# Patient Record
Sex: Male | Born: 1960 | Race: White | Hispanic: No | Marital: Married | Smoking: Never smoker
Health system: Southern US, Community
[De-identification: ages and names within clinical notes are randomized; demographics above are authoritative.]

## PROBLEM LIST (undated history)

## (undated) DIAGNOSIS — R42 Dizziness and giddiness: Secondary | ICD-10-CM

## (undated) DIAGNOSIS — R002 Palpitations: Secondary | ICD-10-CM

## (undated) DIAGNOSIS — I471 Supraventricular tachycardia, unspecified: Secondary | ICD-10-CM

## (undated) DIAGNOSIS — K219 Gastro-esophageal reflux disease without esophagitis: Secondary | ICD-10-CM

## (undated) DIAGNOSIS — I10 Essential (primary) hypertension: Secondary | ICD-10-CM

## (undated) DIAGNOSIS — I493 Ventricular premature depolarization: Secondary | ICD-10-CM

## (undated) DIAGNOSIS — H8110 Benign paroxysmal vertigo, unspecified ear: Secondary | ICD-10-CM

## (undated) HISTORY — DX: Supraventricular tachycardia, unspecified: I47.10

## (undated) HISTORY — DX: Dizziness and giddiness: R42

## (undated) HISTORY — DX: Palpitations: R00.2

## (undated) HISTORY — DX: Gastro-esophageal reflux disease without esophagitis: K21.9

## (undated) HISTORY — DX: Ventricular premature depolarization: I49.3

## (undated) HISTORY — DX: Benign paroxysmal vertigo, unspecified ear: H81.10

## (undated) HISTORY — PX: HERNIA REPAIR: SHX51

## (undated) HISTORY — PX: WISDOM TOOTH EXTRACTION: SHX21

---

## 2001-11-08 HISTORY — PX: CHOLECYSTECTOMY: SHX55

## 2001-11-08 HISTORY — PX: UMBILICAL HERNIA REPAIR: SHX196

## 2002-02-27 ENCOUNTER — Encounter (HOSPITAL_BASED_OUTPATIENT_CLINIC_OR_DEPARTMENT_OTHER): Payer: Self-pay | Admitting: General Surgery

## 2002-02-27 ENCOUNTER — Ambulatory Visit (HOSPITAL_COMMUNITY): Admission: RE | Admit: 2002-02-27 | Discharge: 2002-02-27 | Payer: Self-pay | Admitting: General Surgery

## 2002-03-28 ENCOUNTER — Encounter (HOSPITAL_BASED_OUTPATIENT_CLINIC_OR_DEPARTMENT_OTHER): Payer: Self-pay | Admitting: General Surgery

## 2002-03-30 ENCOUNTER — Ambulatory Visit (HOSPITAL_COMMUNITY): Admission: RE | Admit: 2002-03-30 | Discharge: 2002-03-31 | Payer: Self-pay | Admitting: General Surgery

## 2002-03-30 ENCOUNTER — Encounter (INDEPENDENT_AMBULATORY_CARE_PROVIDER_SITE_OTHER): Payer: Self-pay | Admitting: Specialist

## 2002-08-08 ENCOUNTER — Emergency Department (HOSPITAL_COMMUNITY): Admission: EM | Admit: 2002-08-08 | Discharge: 2002-08-08 | Payer: Self-pay | Admitting: Emergency Medicine

## 2002-08-08 ENCOUNTER — Encounter: Payer: Self-pay | Admitting: Emergency Medicine

## 2004-08-29 ENCOUNTER — Emergency Department (HOSPITAL_COMMUNITY): Admission: EM | Admit: 2004-08-29 | Discharge: 2004-08-29 | Payer: Self-pay | Admitting: *Deleted

## 2004-12-29 ENCOUNTER — Ambulatory Visit: Payer: Self-pay | Admitting: Internal Medicine

## 2005-03-31 ENCOUNTER — Ambulatory Visit: Payer: Self-pay | Admitting: Internal Medicine

## 2007-04-11 ENCOUNTER — Ambulatory Visit: Payer: Self-pay | Admitting: Internal Medicine

## 2007-04-11 LAB — CONVERTED CEMR LAB
ALT: 22 units/L (ref 0–40)
AST: 23 units/L (ref 0–37)
Albumin: 4 g/dL (ref 3.5–5.2)
Alkaline Phosphatase: 47 units/L (ref 39–117)
BUN: 12 mg/dL (ref 6–23)
Basophils Absolute: 0 10*3/uL (ref 0.0–0.1)
Basophils Relative: 0.6 % (ref 0.0–1.0)
Bilirubin, Direct: 0.1 mg/dL (ref 0.0–0.3)
CO2: 31 meq/L (ref 19–32)
Calcium: 9.3 mg/dL (ref 8.4–10.5)
Chloride: 107 meq/L (ref 96–112)
Cholesterol: 196 mg/dL (ref 0–200)
Creatinine, Ser: 0.9 mg/dL (ref 0.4–1.5)
Eosinophils Absolute: 0.1 10*3/uL (ref 0.0–0.6)
Eosinophils Relative: 1.8 % (ref 0.0–5.0)
GFR calc Af Amer: 116 mL/min
GFR calc non Af Amer: 96 mL/min
Glucose, Bld: 99 mg/dL (ref 70–99)
HCT: 46.7 % (ref 39.0–52.0)
HDL: 39.2 mg/dL (ref >39.0)
Hemoglobin: 16.2 g/dL (ref 13.0–17.0)
LDL Cholesterol: 124 mg/dL — ABNORMAL HIGH (ref 0–99)
Lymphocytes Relative: 27.3 % (ref 12.0–46.0)
MCHC: 34.6 g/dL (ref 30.0–36.0)
MCV: 89.8 fL (ref 78.0–100.0)
Monocytes Absolute: 0.5 10*3/uL (ref 0.2–0.7)
Monocytes Relative: 7.4 % (ref 3.0–11.0)
Neutro Abs: 4.4 10*3/uL (ref 1.4–7.7)
Neutrophils Relative %: 62.9 % (ref 43.0–77.0)
Platelets: 317 10*3/uL (ref 150–400)
Potassium: 4.3 meq/L (ref 3.5–5.1)
RBC: 5.21 M/uL (ref 4.22–5.81)
RDW: 12.4 % (ref 11.5–14.6)
Sodium: 141 meq/L (ref 135–145)
TSH: 1.25 microintl units/mL (ref 0.35–5.50)
Total Bilirubin: 1.2 mg/dL (ref 0.3–1.2)
Total CHOL/HDL Ratio: 5
Total Protein: 7.6 g/dL (ref 6.0–8.3)
Triglycerides: 165 mg/dL — ABNORMAL HIGH (ref 0–149)
VLDL: 33 mg/dL (ref 0–40)
WBC: 6.9 10*3/uL (ref 4.5–10.5)

## 2007-05-26 ENCOUNTER — Encounter: Payer: Self-pay | Admitting: Internal Medicine

## 2007-05-26 ENCOUNTER — Ambulatory Visit: Payer: Self-pay | Admitting: Internal Medicine

## 2008-04-27 ENCOUNTER — Emergency Department (HOSPITAL_COMMUNITY): Admission: EM | Admit: 2008-04-27 | Discharge: 2008-04-27 | Payer: Self-pay | Admitting: Emergency Medicine

## 2008-04-29 ENCOUNTER — Ambulatory Visit: Payer: Self-pay | Admitting: Internal Medicine

## 2008-04-29 DIAGNOSIS — R42 Dizziness and giddiness: Secondary | ICD-10-CM

## 2008-04-29 HISTORY — DX: Dizziness and giddiness: R42

## 2008-05-02 ENCOUNTER — Telehealth: Payer: Self-pay | Admitting: Internal Medicine

## 2008-07-01 ENCOUNTER — Ambulatory Visit: Payer: Self-pay | Admitting: Internal Medicine

## 2008-07-01 ENCOUNTER — Telehealth: Payer: Self-pay | Admitting: Internal Medicine

## 2009-07-25 ENCOUNTER — Ambulatory Visit: Payer: Self-pay | Admitting: Internal Medicine

## 2009-07-25 LAB — CONVERTED CEMR LAB
ALT: 22 units/L (ref 0–53)
AST: 19 units/L (ref 0–37)
Albumin: 4.2 g/dL (ref 3.5–5.2)
Alkaline Phosphatase: 45 units/L (ref 39–117)
BUN: 12 mg/dL (ref 6–23)
Basophils Absolute: 0.1 10*3/uL (ref 0.0–0.1)
Basophils Relative: 0.9 % (ref 0.0–3.0)
Bilirubin Urine: NEGATIVE
Bilirubin, Direct: 0.2 mg/dL (ref 0.0–0.3)
CO2: 32 meq/L (ref 19–32)
Calcium: 9.6 mg/dL (ref 8.4–10.5)
Chloride: 106 meq/L (ref 96–112)
Cholesterol: 194 mg/dL (ref 0–200)
Creatinine, Ser: 1.1 mg/dL (ref 0.4–1.5)
Eosinophils Absolute: 0.1 10*3/uL (ref 0.0–0.7)
Eosinophils Relative: 1.5 % (ref 0.0–5.0)
GFR calc non Af Amer: 75.31 mL/min (ref >60)
Glucose, Bld: 104 mg/dL — ABNORMAL HIGH (ref 70–99)
HCT: 48 % (ref 39.0–52.0)
HDL: 41.4 mg/dL (ref >39.00)
Hemoglobin, Urine: NEGATIVE
Hemoglobin: 16.6 g/dL (ref 13.0–17.0)
Ketones, ur: NEGATIVE mg/dL
LDL Cholesterol: 122 mg/dL — ABNORMAL HIGH (ref 0–99)
Leukocytes, UA: NEGATIVE
Lymphocytes Relative: 21.7 % (ref 12.0–46.0)
Lymphs Abs: 1.9 10*3/uL (ref 0.7–4.0)
MCHC: 34.5 g/dL (ref 30.0–36.0)
MCV: 91.6 fL (ref 78.0–100.0)
Monocytes Absolute: 0.5 10*3/uL (ref 0.1–1.0)
Monocytes Relative: 5.9 % (ref 3.0–12.0)
Neutro Abs: 6.2 10*3/uL (ref 1.4–7.7)
Neutrophils Relative %: 70 % (ref 43.0–77.0)
Nitrite: NEGATIVE
PSA: 0.29 ng/mL (ref 0.10–4.00)
Platelets: 269 10*3/uL (ref 150.0–400.0)
Potassium: 4.7 meq/L (ref 3.5–5.1)
RBC: 5.24 M/uL (ref 4.22–5.81)
RDW: 12.8 % (ref 11.5–14.6)
Sodium: 141 meq/L (ref 135–145)
Specific Gravity, Urine: 1.025 (ref 1.000–1.030)
TSH: 1.41 microintl units/mL (ref 0.35–5.50)
Total Bilirubin: 1.3 mg/dL — ABNORMAL HIGH (ref 0.3–1.2)
Total CHOL/HDL Ratio: 5
Total Protein, Urine: NEGATIVE mg/dL
Total Protein: 8.2 g/dL (ref 6.0–8.3)
Triglycerides: 154 mg/dL — ABNORMAL HIGH (ref 0.0–149.0)
Urine Glucose: NEGATIVE mg/dL
Urobilinogen, UA: 0.2 (ref 0.0–1.0)
VLDL: 30.8 mg/dL (ref 0.0–40.0)
WBC: 8.8 10*3/uL (ref 4.5–10.5)
pH: 5.5 (ref 5.0–8.0)

## 2009-08-01 ENCOUNTER — Ambulatory Visit: Payer: Self-pay | Admitting: Internal Medicine

## 2009-08-15 ENCOUNTER — Telehealth (INDEPENDENT_AMBULATORY_CARE_PROVIDER_SITE_OTHER): Payer: Self-pay | Admitting: *Deleted

## 2009-08-21 ENCOUNTER — Telehealth (INDEPENDENT_AMBULATORY_CARE_PROVIDER_SITE_OTHER): Payer: Self-pay | Admitting: *Deleted

## 2010-11-26 ENCOUNTER — Other Ambulatory Visit: Payer: Self-pay | Admitting: Internal Medicine

## 2010-11-26 ENCOUNTER — Ambulatory Visit
Admission: RE | Admit: 2010-11-26 | Discharge: 2010-11-26 | Payer: Self-pay | Source: Home / Self Care | Attending: Internal Medicine | Admitting: Internal Medicine

## 2010-11-26 LAB — CBC WITH DIFFERENTIAL/PLATELET
Basophils Absolute: 0 10*3/uL (ref 0.0–0.1)
Basophils Relative: 0.3 % (ref 0.0–3.0)
Eosinophils Absolute: 0.1 10*3/uL (ref 0.0–0.7)
Eosinophils Relative: 1.5 % (ref 0.0–5.0)
HCT: 47.1 % (ref 39.0–52.0)
Hemoglobin: 16.5 g/dL (ref 13.0–17.0)
Lymphocytes Relative: 31.6 % (ref 12.0–46.0)
Lymphs Abs: 2.4 10*3/uL (ref 0.7–4.0)
MCHC: 35.1 g/dL (ref 30.0–36.0)
MCV: 90.7 fl (ref 78.0–100.0)
Monocytes Absolute: 0.5 10*3/uL (ref 0.1–1.0)
Monocytes Relative: 6.4 % (ref 3.0–12.0)
Neutro Abs: 4.6 10*3/uL (ref 1.4–7.7)
Neutrophils Relative %: 60.2 % (ref 43.0–77.0)
Platelets: 299 10*3/uL (ref 150.0–400.0)
RBC: 5.19 Mil/uL (ref 4.22–5.81)
RDW: 13.2 % (ref 11.5–14.6)
WBC: 7.7 10*3/uL (ref 4.5–10.5)

## 2010-11-26 LAB — BASIC METABOLIC PANEL
BUN: 15 mg/dL (ref 6–23)
CO2: 30 mEq/L (ref 19–32)
Calcium: 9.4 mg/dL (ref 8.4–10.5)
Chloride: 101 mEq/L (ref 96–112)
Creatinine, Ser: 1.1 mg/dL (ref 0.4–1.5)
GFR: 75.7 mL/min (ref >60.00)
Glucose, Bld: 104 mg/dL — ABNORMAL HIGH (ref 70–99)
Potassium: 5.2 mEq/L — ABNORMAL HIGH (ref 3.5–5.1)
Sodium: 137 mEq/L (ref 135–145)

## 2010-11-26 LAB — CONVERTED CEMR LAB
Bilirubin Urine: NEGATIVE
Blood in Urine, dipstick: NEGATIVE
Glucose, Urine, Semiquant: NEGATIVE
Ketones, urine, test strip: NEGATIVE
Nitrite: NEGATIVE
Protein, U semiquant: NEGATIVE
Specific Gravity, Urine: 1.02
Urobilinogen, UA: 0.2
WBC Urine, dipstick: NEGATIVE
pH: 5.5

## 2010-11-26 LAB — LIPID PANEL
Cholesterol: 179 mg/dL (ref 0–200)
HDL: 45.1 mg/dL (ref >39.00)
LDL Cholesterol: 108 mg/dL — ABNORMAL HIGH (ref 0–99)
Total CHOL/HDL Ratio: 4
Triglycerides: 128 mg/dL (ref 0.0–149.0)
VLDL: 25.6 mg/dL (ref 0.0–40.0)

## 2010-11-26 LAB — HEPATIC FUNCTION PANEL
ALT: 25 U/L (ref 0–53)
AST: 26 U/L (ref 0–37)
Albumin: 4.5 g/dL (ref 3.5–5.2)
Alkaline Phosphatase: 48 U/L (ref 39–117)
Bilirubin, Direct: 0.1 mg/dL (ref 0.0–0.3)
Total Bilirubin: 0.8 mg/dL (ref 0.3–1.2)
Total Protein: 7.7 g/dL (ref 6.0–8.3)

## 2010-11-26 LAB — TSH: TSH: 1.32 u[IU]/mL (ref 0.35–5.50)

## 2010-11-26 LAB — PSA: PSA: 0.33 ng/mL (ref 0.10–4.00)

## 2010-12-03 ENCOUNTER — Encounter: Payer: Self-pay | Admitting: Internal Medicine

## 2010-12-04 ENCOUNTER — Ambulatory Visit
Admission: RE | Admit: 2010-12-04 | Discharge: 2010-12-04 | Payer: Self-pay | Source: Home / Self Care | Attending: Internal Medicine | Admitting: Internal Medicine

## 2010-12-04 ENCOUNTER — Encounter: Payer: Self-pay | Admitting: Internal Medicine

## 2010-12-04 NOTE — Progress Notes (Signed)
  Subjective:    Patient ID: Dustin [name], male    DOB: October 24, 1961, 50 y.o.   MRN: [id number]  HPI  50 year old patient who is seen today for a preventive health examination.  He has enjoyed excellent health.  He has no concerns or complaints today.  He exercises regularly without restrictions mainly weight training, but is planning more aerobic activities.    Review of Systems  Constitutional: Negative for fever, chills, activity change, appetite change and fatigue.  HENT: Negative for hearing loss, ear pain, congestion, rhinorrhea, sneezing, mouth sores, trouble swallowing, neck pain, neck stiffness, dental problem, voice change, sinus pressure and tinnitus.   Eyes: Negative for photophobia, pain, redness and visual disturbance.  Respiratory: Negative for apnea, cough, choking, chest tightness, shortness of breath and wheezing.   Cardiovascular: Negative for chest pain, palpitations and leg swelling.  Gastrointestinal: Negative for nausea, vomiting, abdominal pain, diarrhea, constipation, blood in stool, abdominal distention, anal bleeding and rectal pain.  Genitourinary: Negative for dysuria, urgency, frequency, hematuria, flank pain, decreased urine volume, discharge, penile swelling, scrotal swelling, difficulty urinating, genital sores and testicular pain.  Musculoskeletal: Negative for myalgias, back pain, joint swelling, arthralgias and gait problem.  Skin: Negative for color change, rash and wound.  Neurological: Negative for dizziness, tremors, seizures, syncope, facial asymmetry, speech difficulty, weakness, light-headedness, numbness and headaches.  Hematological: Negative for adenopathy. Does not bruise/bleed easily.  Psychiatric/Behavioral: Negative for suicidal ideas, hallucinations, behavioral problems, confusion, sleep disturbance, self-injury, dysphoric mood, decreased concentration and agitation. The patient is not nervous/anxious.        Objective:   Physical Exam    Constitutional: He is oriented to person, place, and time. He appears well-developed and well-nourished.  HENT:  Head: Normocephalic and atraumatic.  Right Ear: External ear normal.  Left Ear: External ear normal.  Nose: Nose normal.  Mouth/Throat: Oropharynx is clear and moist.  Eyes: Conjunctivae and EOM are normal. Pupils are equal, round, and reactive to light. No scleral icterus.  Neck: Normal range of motion. Neck supple. No JVD present. No thyromegaly present.  Cardiovascular: Normal rate, regular rhythm, normal heart sounds and intact distal pulses.  Exam reveals no gallop and no friction rub.   No murmur heard. Pulmonary/Chest: Effort normal and breath sounds normal. He exhibits no tenderness.  Abdominal: Soft. Bowel sounds are normal. He exhibits no distension and no mass. There is no tenderness. There is no rebound and no guarding.  Genitourinary: Prostate normal and penis normal. Guaiac negative stool.  Musculoskeletal: Normal range of motion. He exhibits no edema and no tenderness.  Lymphadenopathy:    He has no cervical adenopathy.  Neurological: He is alert and oriented to person, place, and time. He has normal reflexes. No cranial nerve deficit. Coordination normal.  Skin: Skin is warm and dry. No rash noted.  Psychiatric: He has a normal mood and affect. His behavior is normal. Judgment and thought content normal.          Assessment & Plan:  Impression- unremarkable, clinical examination. Plan-   Will ask the patient to restrict his salt.  He was encouraged to increase his aerobic activity is as well has his weight training.  He will continue to maintain a heart healthy diet.  Prophylactic aspirin therapy.  Discussed.  He was told that in spite of his family history of coronary artery disease.  His overall risk was low and aspirin therapy was optional.  This he will consider.  A screening colonoscopy will be set up

## 2010-12-10 NOTE — Assessment & Plan Note (Signed)
Summary: cpx/njr   Vital Signs:  Dustin [name] profile:   50 year old male Height:      72 inches Weight:      208 pounds BMI:     28.31 O2 Sat:      97 % on Room air Temp:     97.9 degrees F oral Pulse rate:   94 / minute BP sitting:   150 / 90  (right arm) Cuff size:   regular  Vitals Entered By: Duard Brady LPN (December 04, 2010 1:58 PM)  O2 Flow:  Room air CC: cpx - doing well Is Dustin [name] Diabetic? No   CC:  cpx - doing well.  History of Present Illness: 50 year old Dustin [name] who is seen today for a wellness exam  Preventive Screening-Counseling & Management  Alcohol-Tobacco     Smoking Status: quit  Allergies: 1)  ! Novocain (Procaine Hcl)  Past History:  Past Medical History: Reviewed history from 08/01/2009 and no changes required. Orthostatic hypotension  Past Surgical History: Reviewed history from 04/29/2008 and no changes required. umbilical hernia repair 2003 Cholecystectomy 2003  Family History: Reviewed history from 07/Dustin/2008 and no changes required. Family History of CAD Male 1st degree relative <60 father died at 33, status post bypass surgery.  Mother has hypertension. DM2? age 52 Three sisters, one with a history of melanoma; one sister CAD s/p stent and MI  (age 73) Family History of CAD Male 1st degree relative <50  Social History: Reviewed history from 08/01/2009 and no changes required. Regular exercise-yes Former Landscape architect Smoking Status:  quit  Review of Systems  The Dustin [name] denies anorexia, fever, weight loss, weight gain, vision loss, decreased hearing, hoarseness, chest pain, syncope, dyspnea on exertion, peripheral edema, prolonged cough, headaches, hemoptysis, abdominal pain, melena, hematochezia, severe indigestion/heartburn, hematuria, incontinence, genital sores, muscle weakness, suspicious skin lesions, transient blindness, difficulty walking, depression, unusual weight change, abnormal bleeding, enlarged  lymph nodes, angioedema, breast masses, and testicular masses.    Physical Exam  General:  Well-developed,well-nourished,in no acute distress; alert,appropriate and cooperative throughout examination Head:  Normocephalic and atraumatic without obvious abnormalities. No apparent alopecia or balding. Eyes:  No corneal or conjunctival inflammation noted. EOMI. Perrla. Funduscopic exam benign, without hemorrhages, exudates or papilledema. Vision grossly normal. Ears:  External ear exam shows no significant lesions or deformities.  Otoscopic examination reveals clear canals, tympanic membranes are intact bilaterally without bulging, retraction, inflammation or discharge. Hearing is grossly normal bilaterally. Nose:  External nasal examination shows no deformity or inflammation. Nasal mucosa are pink and moist without lesions or exudates. Mouth:  Oral mucosa and oropharynx without lesions or exudates.  Teeth in good repair. Neck:  No deformities, masses, or tenderness noted. Chest Wall:  No deformities, masses, tenderness or gynecomastia noted. Breasts:  No masses or gynecomastia noted Lungs:  Normal respiratory effort, chest expands symmetrically. Lungs are clear to auscultation, no crackles or wheezes. Heart:  Normal rate and regular rhythm. S1 and S2 normal without gallop, murmur, click, rub or other extra sounds. Abdomen:  Bowel sounds positive,abdomen soft and non-tender without masses, organomegaly or hernias noted. Rectal:  No external abnormalities noted. Normal sphincter tone. No rectal masses or tenderness. Genitalia:  Testes bilaterally descended without nodularity, tenderness or masses. No scrotal masses or lesions. No penis lesions or urethral discharge. Prostate:  Prostate gland firm and smooth, no enlargement, nodularity, tenderness, mass, asymmetry or induration. Msk:  No deformity or scoliosis noted of thoracic or lumbar spine.   Pulses:  R and L  carotid,radial,femoral,dorsalis pedis  and posterior tibial pulses are full and equal bilaterally Extremities:  No clubbing, cyanosis, edema, or deformity noted with normal full range of motion of all joints.   Neurologic:  No cranial nerve deficits noted. Station and gait are normal. Plantar reflexes are down-going bilaterally. DTRs are symmetrical throughout. Sensory, motor and coordinative functions appear intact. Skin:  Intact without suspicious lesions or rashes Cervical Nodes:  No lymphadenopathy noted Axillary Nodes:  No palpable lymphadenopathy Inguinal Nodes:  No significant adenopathy Psych:  Cognition and judgment appear intact. Alert and cooperative with normal attention span and concentration. No apparent delusions, illusions, hallucinations   Impression & Recommendations:  Problem # 1:  PREVENTIVE HEALTH CARE (ICD-V70.0)  Orders: Gastroenterology Referral (GI)  Dustin [name] Instructions: 1)  Please schedule a follow-up appointment in 1 year. 2)  Limit your Sodium (Salt). 3)  It is important that you exercise regularly at least 20 minutes 5 times a week. If you develop chest pain, have severe difficulty breathing, or feel very tired , stop exercising immediately and seek medical attention. 4)  Schedule a colonoscopy/sigmoidoscopy to help detect colon cancer. 5)  Check your Blood Pressure regularly. If it is above: 150/90  you should make an appointment.   Orders Added: 1)  Est. Dustin [name] 52-64 years [[id number]] 2)  Gastroenterology Referral [GI]

## 2011-03-26 NOTE — Op Note (Signed)
[place]. [facility]  Patient:    Dustin [name], Dustin [name] Visit Number: [id number] MRN: [id number]          Service Type: DSU Location: [id number] Attending Physician:  Sonda Primes Dictated by:   Mardene Celeste. Lurene Shadow, M.D. Proc. Date: 03/30/02 Admit Date:  03/30/2002 Discharge Date: 03/31/2002   CC:         Teena Irani. Arlyce Dice, M.D.   Operative Report  PREOPERATIVE DIAGNOSES: 1. Chronic calculus cholecystitis. 2. Incarcerated umbilical hernia.  POSTOPERATIVE DIAGNOSES: 1. Chronic calculus cholecystitis. 2. Incarcerated umbilical hernia.  OPERATION PERFORMED:  Laparoscopic cholecystectomy and repair of umbilical hernia.  SURGEON:  Mardene Celeste. Lurene Shadow, M.D.  ASSISTANT:  Marnee Spring. Wiliam Ke, M.D.  ANESTHESIA:  General.  INDICATIONS FOR PROCEDURE:  The patient is a 50 year old man presenting with severe epigastric pain which was intermittent in nature.  This was first thought to be because of an incarcerated umbilical hernia; however, on further investigation he underwent abdominal ultrasound and was noted to have multiple small gallstones within his gallbladder.  He is brought now to the operating room after the risks and potential benefits of cholecystectomy and hernia repair had been discussed with him and he gives his full consent to surgery.  DESCRIPTION OF PROCEDURE:  Following the induction of satisfactory general anesthesia with the patient positioned supinely, the abdomen was prepped and draped to be included in the sterile operative field.  I made a superumbilical curvilinear incision and deepened this through the skin and subcutaneous tissue and raising the umbilical skin as a flap, dissected down on this incarcerated hernia.  The incision was freed on all sides and carried down to the fascia.  The incarcerated and sac were then freed, clamped and amputated and secured with ties of 2-0 silk.  The hernial defect was then used to place a Hasson  cannula within the abdomen and the abdomen was then insufflated with  carbon dioxide to 14 mmHg pressure.  The abdominal cavity was then explored visually.  Liver edges were sharp.  Liver surface was smooth.  The gallbladder was chronically scarred with multiple adhesions.  The anterior gastric wall and duodenal sweep appeared to be normal.  None of the large or small intestine viewed appeared to be abnormal.  There were multiple adhesions in the pelvis although this patient has not had any previous abdominal surgery.  Under direct vision, the epigastric and lateral ports were placed.  The gallbladder was grasped and retracted cephalad and dissection carried down near the ampulla with isolation of the cystic artery and cystic duct.  The cystic artery was doubly clipped and transected.  The cystic duct was opened.  I attempted to to a cholangiogram.  However, the cystic duct was extremely small and a cholangiocatheter could not be passed into it.  The cystic duct was then doubly clipped and transected.  The gallbladder was then dissected free from the liver bed maintaining hemostasis throughout the course of the dissection with electrocautery.  At the end of the dissection, the liver bed was again checked carefully for hemostasis.  Additional bleeding points treated with electrocautery.  The camera was then moved to the epigastric port after the right upper quadrant had been thoroughly irrigated. The gallbladder was retrieved through the umbilical port without difficulty. Sponge, instrument and sharp counts were fully verified.  The pneumoperitoneum allowed to deflate and the wounds closed in layers as follows.  The umbilical wound was closed easily with a 0 Dexon thereby repairing the  hernia.  Then the skin was closed with running 4-0 Vicryl.  4-0 Vicryl was used to close the skin in the epigastric and lateral flank wounds.  These wounds were reinforced with Steri-Strips and sterile  dressings applied.  Anesthetic reversed. Patient removed from the operating room to the recovery room in stable condition having tolerated the procedure well. Dictated by:   Mardene Celeste. Lurene Shadow, M.D. Attending Physician:  Sonda Primes DD:  03/30/02 TD:  04/02/02 Job: [phone]

## 2011-08-05 LAB — POCT I-STAT, CHEM 8
Chloride: 106
Creatinine, Ser: 1.1
Glucose, Bld: 140 — ABNORMAL HIGH
Potassium: 4.3

## 2011-08-05 LAB — POCT CARDIAC MARKERS
CKMB, poc: <1 — ABNORMAL LOW
Troponin i, poc: <0.05

## 2013-04-14 ENCOUNTER — Encounter (HOSPITAL_BASED_OUTPATIENT_CLINIC_OR_DEPARTMENT_OTHER): Payer: Self-pay | Admitting: *Deleted

## 2013-04-14 ENCOUNTER — Emergency Department (HOSPITAL_BASED_OUTPATIENT_CLINIC_OR_DEPARTMENT_OTHER)
Admission: EM | Admit: 2013-04-14 | Discharge: 2013-04-14 | Disposition: A | Discharge status: Home / Self Care | Payer: BC Managed Care – PPO | Attending: Emergency Medicine | Admitting: Emergency Medicine

## 2013-04-14 DIAGNOSIS — S39012A Strain of muscle, fascia and tendon of lower back, initial encounter: Secondary | ICD-10-CM

## 2013-04-14 DIAGNOSIS — Y929 Unspecified place or not applicable: Secondary | ICD-10-CM | POA: Insufficient documentation

## 2013-04-14 DIAGNOSIS — R21 Rash and other nonspecific skin eruption: Secondary | ICD-10-CM | POA: Insufficient documentation

## 2013-04-14 DIAGNOSIS — X58XXXA Exposure to other specified factors, initial encounter: Secondary | ICD-10-CM | POA: Insufficient documentation

## 2013-04-14 DIAGNOSIS — S335XXA Sprain of ligaments of lumbar spine, initial encounter: Secondary | ICD-10-CM | POA: Insufficient documentation

## 2013-04-14 DIAGNOSIS — Y9389 Activity, other specified: Secondary | ICD-10-CM | POA: Insufficient documentation

## 2013-04-14 MED ORDER — ONDANSETRON 4 MG PO TBDP
ORAL_TABLET | ORAL | Status: AC
  Administered 2013-04-14: 4 mg via ORAL
  Filled 2013-04-14: qty 1

## 2013-04-14 MED ORDER — HYDROMORPHONE HCL PF 2 MG/ML IJ SOLN
2.0000 mg | Freq: Once | INTRAMUSCULAR | Status: AC
  Administered 2013-04-14: 2 mg via INTRAMUSCULAR
  Filled 2013-04-14: qty 1

## 2013-04-14 MED ORDER — ONDANSETRON 4 MG PO TBDP
4.0000 mg | ORAL_TABLET | Freq: Once | ORAL | Status: AC
  Administered 2013-04-14: 4 mg via ORAL

## 2013-04-14 MED ORDER — ONDANSETRON 4 MG PO TBDP: 4.0000 mg | ORAL_TABLET | Freq: Three times a day (TID) | ORAL | Status: DC | PRN

## 2013-04-14 MED ORDER — CYCLOBENZAPRINE HCL 10 MG PO TABS: 10.0000 mg | ORAL_TABLET | Freq: Two times a day (BID) | ORAL | Status: DC | PRN

## 2013-04-14 MED ORDER — HYDROCODONE-ACETAMINOPHEN 5-325 MG PO TABS: 1.0000 | ORAL_TABLET | Freq: Four times a day (QID) | ORAL | Status: DC | PRN

## 2013-04-14 NOTE — ED Provider Notes (Signed)
History     CSN: [id number]  Arrival date & time 04/14/13  0709   First MD Initiated Contact with Patient 04/14/13 [phone]      Chief Complaint  Patient presents with  . Back Pain    (Consider location/radiation/quality/duration/timing/severity/associated sxs/prior treatment) Patient is a 52 y.o. male presenting with back pain. The history is provided by the patient.  Back Pain Associated symptoms: no abdominal pain, no chest pain, no fever, no headaches, no numbness and no weakness    patient again to the onset of right lower back pain while putting the top on the jeep last evening. No direct injury. Back remains painful in the area and radiates down a little bit to the first part of the buttocks no radiation down the leg. No numbness or weakness in his legs. No incontinence. Last time he had trouble with back was in 1988. Pain described 10 out of 10 sharp ache. Made worse with movement.  Past Medical History  Diagnosis Date  . 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 Cholecystectomy  2003  . Umbilical hernia repair  2003    Family History  Problem Relation Age of Onset  . Hypertension Mother   . Cancer Sister     melanoma  . Heart disease Sister     heart attack 11/2010 - stents placed    History  Substance Use Topics  . Smoking status: Never Smoker   . Smokeless tobacco: Not on file  . Alcohol Use: Yes      Review of Systems  Constitutional: Negative for fever.  HENT: Negative for neck pain.   Eyes: Negative for redness.  Respiratory: Negative for shortness of breath.   Cardiovascular: Negative for chest pain.  Gastrointestinal: Negative for abdominal pain.  Musculoskeletal: Positive for back pain.  Skin: Positive for rash.  Neurological: Negative for weakness, numbness and headaches.  Hematological: Does not bruise/bleed easily.  Psychiatric/Behavioral: Negative for confusion.    Allergies  Procaine  hcl  Home Medications   Current Outpatient Rx  Name  Route  Sig  Dispense  Refill  . cyclobenzaprine (FLEXERIL) 10 MG tablet   Oral   Take 1 tablet (10 mg total) by mouth 2 (two) times daily as needed for muscle spasms.   20 tablet   0   . fluticasone (FLOVENT DISKUS) 50 MCG/BLIST diskus inhaler   Inhalation   Inhale 1 puff into the lungs daily.           Marland Kitchen HYDROcodone-acetaminophen (NORCO/VICODIN) 5-325 MG per tablet   Oral   Take 1-2 tablets by mouth every 6 (six) hours as needed for pain.   20 tablet   0     BP 144/81  Pulse 67  Temp(Src) 97.7 F (36.5 C) (Oral)  Resp 20  Ht 6\' 1"  (1.854 m)  Wt 203 lb (92.08 kg)  BMI 26.79 kg/m2  SpO2 100%  Physical Exam  Nursing note and vitals reviewed. Constitutional: He is oriented to person, place, and time. He appears well-developed and well-nourished. No distress.  HENT:  Head: Normocephalic and atraumatic.  Eyes: Conjunctivae and EOM are normal. Pupils are equal, round, and reactive to light.  Neck: Normal range of motion.  Cardiovascular: Normal rate, regular rhythm and normal heart sounds.   Pulmonary/Chest: Effort normal and breath sounds normal.  Abdominal: Soft. Bowel sounds are normal. There is no tenderness.  Musculoskeletal: Normal range of motion. He exhibits tenderness. He exhibits no edema.  Palpable tenderness to the right paraspinous lumbar muscle area no spasm.  Neurological: He is alert and oriented to person, place, and time. No cranial nerve deficit. He exhibits normal muscle tone. Coordination normal.  Skin: Skin is warm. No rash noted.    ED Course  Procedures (including critical care time)  Labs Reviewed - No data to display No results found.   1. Lumbar strain, initial encounter       MDM  Patient with acute onset of right lower back pain occurred when putting the top on the jeep last night. No focal neurological deficits. Last time had trouble with back pain was in 1988. No other  injury.        Shelda Jakes, MD 04/14/13 [phone]

## 2013-04-14 NOTE — ED Notes (Signed)
The patient is undressed and in a gown. The bed is locked and in the lowest position. The call light is within reach.

## 2013-04-14 NOTE — ED Notes (Signed)
Patient c/o lower right side back spasms since putting top on jeep last night, no meds taken

## 2014-03-15 ENCOUNTER — Ambulatory Visit (INDEPENDENT_AMBULATORY_CARE_PROVIDER_SITE_OTHER): Payer: BC Managed Care – PPO | Admitting: Internal Medicine

## 2014-03-15 ENCOUNTER — Telehealth: Payer: Self-pay | Admitting: Internal Medicine

## 2014-03-15 ENCOUNTER — Encounter: Payer: Self-pay | Admitting: Internal Medicine

## 2014-03-15 VITALS — BP 152/94 | HR 89 | Temp 97.7°F | Resp 20 | Ht 73.0 in | Wt 205.0 lb

## 2014-03-15 DIAGNOSIS — I10 Essential (primary) hypertension: Secondary | ICD-10-CM

## 2014-03-15 MED ORDER — LISINOPRIL-HYDROCHLOROTHIAZIDE 20-12.5 MG PO TABS: 1.0000 | ORAL_TABLET | Freq: Every day | ORAL | Status: DC

## 2014-03-15 NOTE — Telephone Encounter (Signed)
Patient Information:  Caller Name: Dustin [name]  Phone: [phone]  Patient: Dustin [name], Dustin [name]  Gender: Male  DOB: May 05, 1961  Age: 53 Years  PCP: Eleonore ChiquitoKwiatkowski, Peter (Family Practice > 7867yrs old)  Office Follow Up:  Does the office need to follow up with this patient?: No  Instructions For The Office: N/A   Symptoms  Reason For Call & Symptoms: Patient calling about headache all day on 03/14/14.  BP 189/108 at work on 03/14/14.   He went to Urgent Care 180/99; he had EKG and was sent to ED.  BP 160/90.  He waited an hour and came home without being seen.  He requests appointment with PCP. BP 152/90 on 03/15/14 at home.  He relates he has never been on BP medication.  Emergent symptoms ruled out.  See Today in Office per High Blood Pressure guideline due to Patient wants to be seen.  Reviewed Health History In EMR: Yes  Reviewed Medications In EMR: Yes  Reviewed Allergies In EMR: Yes  Reviewed Surgeries / Procedures: Yes  Date of Onset of Symptoms: 03/14/2014  Guideline(s) Used:  High Blood Pressure  Disposition Per Guideline:   See Today in Office  Reason For Disposition Reached:   Patient wants to be seen  Advice Given:  General:  Untreated high blood pressure may cause damage to the heart, brain, kidneys, and eyes.  Treatment of high blood pressure can reduce the risk of stroke, heart attack, and heart failure.  The goal of blood pressure treatment for most patients with hypertension is to keep the blood pressure under 140/90.  Lifestyle Changes  Maintain a healthy weight. Lose weight if you are overweight.  Do 30 minutes of aerobic physical activity (e.g., brisk walking) most days of the week.  Eat a diet high in fresh fruits and low-fat dairy products. Limit your intake of saturated and total fat. Choose foods that are lower in salt.  If you smoke, you should stop.  If you drink alcohol, you should limit your daily alcohol drinking. Women should have no more than one drink per day. Men  should have no more than 2 drinks per day. A drink is defined as 1.5 oz hard liquor (one shot or jigger; 45 ml), 5 oz wine (small glass; 150 ml), or 12 oz beer (one can; 360 ml).  Call Back If:  Headache, blurred vision, difficulty talking, or difficulty walking occurs  Chest pain or difficulty breathing occurs  You become worse.  Patient Will Follow Care Advice:  YES  Appointment Scheduled:  03/15/2014 14:15:00 Appointment Scheduled Provider:  Eleonore ChiquitoKwiatkowski, Peter (Family Practice > 4867yrs old)

## 2014-03-15 NOTE — Progress Notes (Signed)
   Subjective:    Patient ID: Dustin [name], male    DOB: 1961-06-06, 53 y.o.   MRN: [id number]  HPI 53 year old patient who presently is working out of town in [place].  Yesterday he became quite flushed in his blood pressure check at his work site and was noted to be 189 over 108.  He was referred to the urgent care where he was still in the 180 over 90 range.  Blood pressure readings have been consistently high at today as well.  He generally feels okay, but does admit to some situational job-related stress  Past Medical History  Diagnosis Date  . 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 Marital Status: Married    Spouse Name: N/A    Number of Children: N/A  . Years of Education: N/A   Occupational History  . Not on file.   Social History Main Topics  . Smoking status: Never Smoker   . Smokeless tobacco: Never Used  . Alcohol Use: No  . Drug Use: No  . Sexual Activity: Not on file   Other Topics Concern  . Not on file   Social History Narrative  . No narrative on file    Past Surgical History  Procedure Laterality Date  . Cholecystectomy  2003  . Umbilical hernia repair  2003    Family History  Problem Relation Age of Onset  . Hypertension Mother   . Cancer Sister     melanoma  . Heart disease Sister     heart attack 11/2010 - stents placed    Allergies  Allergen Reactions  . Procaine Hcl     No current outpatient prescriptions on file prior to visit.   No current facility-administered medications on file prior to visit.    BP 152/94  Pulse 89  Temp(Src) 97.7 F (36.5 C) (Oral)  Resp 20  Ht 6\' 1"  (1.854 m)  Wt 205 lb (92.987 kg)  BMI 27.05 kg/m2  SpO2 98%       Review of Systems     Objective:   Physical Exam  Constitutional: He is oriented to person, place, and time. He appears well-developed.  160 over 94  HENT:  Head: Normocephalic.  Right Ear: External ear normal.  Left Ear: External ear normal.  Eyes:  Conjunctivae and EOM are normal.  Neck: Normal range of motion.  Cardiovascular: Normal rate and normal heart sounds.   Pulmonary/Chest: Breath sounds normal.  Abdominal: Bowel sounds are normal.  Musculoskeletal: Normal range of motion. He exhibits no edema and no tenderness.  Neurological: He is alert and oriented to person, place, and time.  Psychiatric: He has a normal mood and affect. His behavior is normal.          Assessment & Plan:  Hypertension.  Nonpharmacologic aspects discussed Patient information dispensed We'll start on lisinopril, hydrochlorothiazide  Recheck 4-6 weeks

## 2014-03-15 NOTE — Patient Instructions (Signed)
Limit your sodium (Salt) intake    It is important that you exercise regularly, at least 20 minutes 3 to 4 times per week.  If you develop chest pain or shortness of breath seek  medical attention.  Please check your blood pressure on a regular basis.  If it is consistently greater than 150/90, please make an office appointment.  Hypertension As your heart beats, it forces blood through your arteries. This force is your blood pressure. If the pressure is too high, it is called hypertension (HTN) or high blood pressure. HTN is dangerous because you may have it and not know it. High blood pressure may mean that your heart has to work harder to pump blood. Your arteries may be narrow or stiff. The extra work puts you at risk for heart disease, stroke, and other problems.  Blood pressure consists of two numbers, a higher number over a lower, 110/72, for example. It is stated as "110 over 72." The ideal is below 120 for the top number (systolic) and under 80 for the bottom (diastolic). Write down your blood pressure today. You should pay close attention to your blood pressure if you have certain conditions such as:  Heart failure.  Prior heart attack.  Diabetes  Chronic kidney disease.  Prior stroke.  Multiple risk factors for heart disease. To see if you have HTN, your blood pressure should be measured while you are seated with your arm held at the level of the heart. It should be measured at least twice. A one-time elevated blood pressure reading (especially in the Emergency Department) does not mean that you need treatment. There may be conditions in which the blood pressure is different between your right and left arms. It is important to see your caregiver soon for a recheck. Most people have essential hypertension which means that there is not a specific cause. This type of high blood pressure may be lowered by changing lifestyle factors such as:  Stress.  Smoking.  Lack of  exercise.  Excessive weight.  Drug/tobacco/alcohol use.  Eating less salt. Most people do not have symptoms from high blood pressure until it has caused damage to the body. Effective treatment can often prevent, delay or reduce that damage. TREATMENT  When a cause has been identified, treatment for high blood pressure is directed at the cause. There are a large number of medications to treat HTN. These fall into several categories, and your caregiver will help you select the medicines that are best for you. Medications may have side effects. You should review side effects with your caregiver. If your blood pressure stays high after you have made lifestyle changes or started on medicines,   Your medication(s) may need to be changed.  Other problems may need to be addressed.  Be certain you understand your prescriptions, and know how and when to take your medicine.  Be sure to follow up with your caregiver within the time frame advised (usually within two weeks) to have your blood pressure rechecked and to review your medications.  If you are taking more than one medicine to lower your blood pressure, make sure you know how and at what times they should be taken. Taking two medicines at the same time can result in blood pressure that is too low. SEEK IMMEDIATE MEDICAL CARE IF:  You develop a severe headache, blurred or changing vision, or confusion.  You have unusual weakness or numbness, or a faint feeling.  You have severe chest or abdominal pain,  vomiting, or breathing problems. MAKE SURE YOU:   Understand these instructions.  Will watch your condition.  Will get help right away if you are not doing well or get worse. Document Released: 10/25/2005 Document Revised: 01/17/2012 Document Reviewed: 06/14/2008 [facility]Exit[facility] Patient Information 2014 [place]Care, [place]. DASH Diet The DASH diet stands for "Dietary Approaches to Stop Hypertension." It is a healthy eating plan that has been  shown to reduce high blood pressure (hypertension) in as little as 14 days, while also possibly providing other significant health benefits. These other health benefits include reducing the risk of breast cancer after menopause and reducing the risk of type 2 diabetes, heart disease, colon cancer, and stroke. Health benefits also include weight loss and slowing kidney failure in patients with chronic kidney disease.  DIET GUIDELINES  Limit salt (sodium). Your diet should contain less than 1500 mg of sodium daily.  Limit refined or processed carbohydrates. Your diet should include mostly whole grains. Desserts and added sugars should be used sparingly.  Include small amounts of heart-healthy fats. These types of fats include nuts, oils, and tub margarine. Limit saturated and trans fats. These fats have been shown to be harmful in the body. CHOOSING FOODS  The following food groups are based on a 2000 calorie diet. See your Registered Dietitian for individual calorie needs. Grains and Grain Products (6 to 8 servings daily)  Eat More Often: Whole-wheat bread, brown rice, whole-grain or wheat pasta, quinoa, popcorn without added fat or salt (air popped).  Eat Less Often: White bread, white pasta, white rice, cornbread. Vegetables (4 to 5 servings daily)  Eat More Often: Fresh, frozen, and canned vegetables. Vegetables may be raw, steamed, roasted, or grilled with a minimal amount of fat.  Eat Less Often/Avoid: Creamed or fried vegetables. Vegetables in a cheese sauce. Fruit (4 to 5 servings daily)  Eat More Often: All fresh, canned (in natural juice), or frozen fruits. Dried fruits without added sugar. One hundred percent fruit juice ( cup [237 mL] daily).  Eat Less Often: Dried fruits with added sugar. Canned fruit in light or heavy syrup. Foot LockerLean Meats, Fish, and Poultry (2 servings or less daily. One serving is 3 to 4 oz [85-114 g]).  Eat More Often: Ninety percent or leaner ground beef,  tenderloin, sirloin. Round cuts of beef, chicken breast, [place]key breast. All fish. Grill, bake, or broil your meat. Nothing should be fried.  Eat Less Often/Avoid: Fatty cuts of meat, [place]key, or chicken leg, thigh, or wing. Fried cuts of meat or fish. Dairy (2 to 3 servings)  Eat More Often: Low-fat or fat-free milk, low-fat plain or light yogurt, reduced-fat or part-skim cheese.  Eat Less Often/Avoid: Milk (whole, 2%).Whole milk yogurt. Full-fat cheeses. Nuts, Seeds, and Legumes (4 to 5 servings per week)  Eat More Often: All without added salt.  Eat Less Often/Avoid: Salted nuts and seeds, canned beans with added salt. Fats and Sweets (limited)  Eat More Often: Vegetable oils, tub margarines without trans fats, sugar-free gelatin. Mayonnaise and salad dressings.  Eat Less Often/Avoid: Coconut oils, palm oils, butter, stick margarine, cream, half and half, cookies, candy, pie. FOR MORE INFORMATION The Dash Diet Eating Plan: www.dashdiet.org Document Released: 10/14/2011 Document Revised: 01/17/2012 Document Reviewed: 10/14/2011 [facility]Exit[facility] Patient Information 2014 [place]ExitCare, [place].

## 2014-03-15 NOTE — Progress Notes (Signed)
Pre-visit discussion using our clinic review tool. No additional management support is needed unless otherwise documented below in the visit note.  

## 2014-03-16 ENCOUNTER — Telehealth: Payer: Self-pay | Admitting: Internal Medicine

## 2014-03-16 NOTE — Telephone Encounter (Signed)
Relevant patient education mailed to patient.  

## 2014-03-26 ENCOUNTER — Ambulatory Visit (INDEPENDENT_AMBULATORY_CARE_PROVIDER_SITE_OTHER): Payer: BC Managed Care – PPO | Admitting: Internal Medicine

## 2014-03-26 ENCOUNTER — Encounter: Payer: Self-pay | Admitting: Internal Medicine

## 2014-03-26 VITALS — BP 142/90 | HR 90 | Temp 97.9°F | Resp 20 | Ht 73.0 in | Wt 201.0 lb

## 2014-03-26 DIAGNOSIS — I10 Essential (primary) hypertension: Secondary | ICD-10-CM

## 2014-03-26 DIAGNOSIS — R079 Chest pain, unspecified: Secondary | ICD-10-CM

## 2014-03-26 NOTE — Progress Notes (Signed)
Subjective:    Patient ID: Dustin [name], male    DOB: [birth year], 53 y.o.   MRN: [id number]  HPI  53 year old patient who was placed on combination lisinopril hydrochlorothiazide, recently, due to stage  2 hypertension.  Blood pressure readings have generally done much better.  He states that he had a single blood pressure reading as low as 90 over 60, but generally blood pressure readings have been in a high normal range.  He has felt poorly since initiating therapy with headaches, blurred vision, fatigue, and just a general sense of wellness.  He is having dry cough, and nonspecific chest tightness.  Past Medical History  Diagnosis Date  . 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 Marital Status: Married    Spouse Name: N/A    Number of Children: N/A  . Years of Education: N/A   Occupational History  . Not on file.   Social History Main Topics  . Smoking status: Never Smoker   . Smokeless tobacco: Never Used  . Alcohol Use: No  . Drug Use: No  . Sexual Activity: Not on file   Other Topics Concern  . Not on file   Social History Narrative  . No narrative on file    Past Surgical History  Procedure Laterality Date  . Cholecystectomy  2003  . Umbilical hernia repair  2003    Family History  Problem Relation Age of Onset  . Hypertension Mother   . Cancer Sister     melanoma  . Heart disease Sister     heart attack 11/2010 - stents placed    Allergies  Allergen Reactions  . Procaine Hcl     Current Outpatient Prescriptions on File Prior to Visit  Medication Sig Dispense Refill  . aspirin 81 MG tablet Take 81 mg by mouth daily.      Marland Kitchen. lisinopril-hydrochlorothiazide (ZESTORETIC) 20-12.5 MG per tablet Take 1 tablet by mouth daily.  90 tablet  3   No current facility-administered medications on file prior to visit.    BP 142/90  Pulse 90  Temp(Src) 97.9 F (36.6 C) (Oral)  Resp 20  Ht 6\' 1"  (1.854 m)  Wt 201 lb (91.173 kg)  BMI  26.52 kg/m2  SpO2 98%       Review of Systems  Constitutional: Positive for activity change and fatigue. Negative for fever, chills and appetite change.  HENT: Negative for congestion, dental problem, ear pain, hearing loss, sore throat, tinnitus, trouble swallowing and voice change.   Eyes: Negative for pain, discharge and visual disturbance.  Respiratory: Positive for cough. Negative for chest tightness, wheezing and stridor.   Cardiovascular: Negative for chest pain, palpitations and leg swelling.  Gastrointestinal: Negative for nausea, vomiting, abdominal pain, diarrhea, constipation, blood in stool and abdominal distention.  Genitourinary: Negative for urgency, hematuria, flank pain, discharge, difficulty urinating and genital sores.  Musculoskeletal: Negative for arthralgias, back pain, gait problem, joint swelling, myalgias and neck stiffness.  Skin: Negative for rash.  Neurological: Positive for dizziness, weakness and headaches. Negative for syncope, speech difficulty and numbness.  Hematological: Negative for adenopathy. Does not bruise/bleed easily.  Psychiatric/Behavioral: Negative for behavioral problems and dysphoric mood. The patient is not nervous/anxious.        Objective:   Physical Exam  Constitutional: He is oriented to person, place, and time. He appears well-developed.  Blood pressure 140/86  HENT:  Head: Normocephalic.  Right Ear: External ear normal.  Left Ear:  External ear normal.  Eyes: Conjunctivae and EOM are normal.  Neck: Normal range of motion.  Cardiovascular: Normal rate and normal heart sounds.   Pulmonary/Chest: Breath sounds normal.  Abdominal: Bowel sounds are normal.  Musculoskeletal: Normal range of motion. He exhibits no edema and no tenderness.  Neurological: He is alert and oriented to person, place, and time.  Psychiatric: He has a normal mood and affect. His behavior is normal.          Assessment & Plan:   Hypertension.   We'll discontinue treatment at this time.  We'll maintain the low-salt diet and DASH.  We'll continue close home blood pressure monitoring.  We'll start a different antihypertensive medication.  If blood pressures are consistently elevated.

## 2014-03-26 NOTE — Patient Instructions (Signed)
Limit your sodium (Salt) intake  Please check your blood pressure on a regular basis.  If it is consistently greater than 150/90, please make an office appointment.  DASH Diet The DASH diet stands for "Dietary Approaches to Stop Hypertension." It is a healthy eating plan that has been shown to reduce high blood pressure (hypertension) in as little as 14 days, while also possibly providing other significant health benefits. These other health benefits include reducing the risk of breast cancer after menopause and reducing the risk of type 2 diabetes, heart disease, colon cancer, and stroke. Health benefits also include weight loss and slowing kidney failure in patients with chronic kidney disease.  DIET GUIDELINES  Limit salt (sodium). Your diet should contain less than 1500 mg of sodium daily.  Limit refined or processed carbohydrates. Your diet should include mostly whole grains. Desserts and added sugars should be used sparingly.  Include small amounts of heart-healthy fats. These types of fats include nuts, oils, and tub margarine. Limit saturated and trans fats. These fats have been shown to be harmful in the body. CHOOSING FOODS  The following food groups are based on a 2000 calorie diet. See your Registered Dietitian for individual calorie needs. Grains and Grain Products (6 to 8 servings daily)  Eat More Often: Whole-wheat bread, brown rice, whole-grain or wheat pasta, quinoa, popcorn without added fat or salt (air popped).  Eat Less Often: White bread, white pasta, white rice, cornbread. Vegetables (4 to 5 servings daily)  Eat More Often: Fresh, frozen, and canned vegetables. Vegetables may be raw, steamed, roasted, or grilled with a minimal amount of fat.  Eat Less Often/Avoid: Creamed or fried vegetables. Vegetables in a cheese sauce. Fruit (4 to 5 servings daily)  Eat More Often: All fresh, canned (in natural juice), or frozen fruits. Dried fruits without added sugar. One  hundred percent fruit juice ( cup [237 mL] daily).  Eat Less Often: Dried fruits with added sugar. Canned fruit in light or heavy syrup. Lean Meats, Fish, and Poultry (2 servings or less daily. One serving is 3 to 4 oz [85-114 g]).  Eat More Often: Ninety percent or leaner ground beef, tenderloin, sirloin. Round cuts of beef, chicken breast, turkey breast. All fish. Grill, bake, or broil your meat. Nothing should be fried.  Eat Less Often/Avoid: Fatty cuts of meat, turkey, or chicken leg, thigh, or wing. Fried cuts of meat or fish. Dairy (2 to 3 servings)  Eat More Often: Low-fat or fat-free milk, low-fat plain or light yogurt, reduced-fat or part-skim cheese.  Eat Less Often/Avoid: Milk (whole, 2%).Whole milk yogurt. Full-fat cheeses. Nuts, Seeds, and Legumes (4 to 5 servings per week)  Eat More Often: All without added salt.  Eat Less Often/Avoid: Salted nuts and seeds, canned beans with added salt. Fats and Sweets (limited)  Eat More Often: Vegetable oils, tub margarines without trans fats, sugar-free gelatin. Mayonnaise and salad dressings.  Eat Less Often/Avoid: Coconut oils, palm oils, butter, stick margarine, cream, half and half, cookies, candy, pie. FOR MORE INFORMATION The Dash Diet Eating Plan: www.dashdiet.org Document Released: 10/14/2011 Document Revised: 01/17/2012 Document Reviewed: 10/14/2011 [employer] Patient Information 2014 [employer].  

## 2014-03-26 NOTE — Progress Notes (Signed)
Pre-visit discussion using our clinic review tool. No additional management support is needed unless otherwise documented below in the visit note.  

## 2014-04-12 ENCOUNTER — Ambulatory Visit: Payer: BC Managed Care – PPO | Admitting: Internal Medicine

## 2014-05-21 ENCOUNTER — Other Ambulatory Visit (INDEPENDENT_AMBULATORY_CARE_PROVIDER_SITE_OTHER): Payer: BC Managed Care – PPO

## 2014-05-21 DIAGNOSIS — Z0000 Encounter for general adult medical examination without abnormal findings: Secondary | ICD-10-CM

## 2014-05-21 LAB — POCT URINALYSIS DIPSTICK
BILIRUBIN UA: NEGATIVE
Blood, UA: NEGATIVE
GLUCOSE UA: NEGATIVE
KETONES UA: NEGATIVE
LEUKOCYTES UA: NEGATIVE
NITRITE UA: NEGATIVE
Protein, UA: NEGATIVE
Spec Grav, UA: 1.015
Urobilinogen, UA: 0.2
pH, UA: 7

## 2014-05-21 LAB — CBC WITH DIFFERENTIAL/PLATELET
BASOS ABS: 0 10*3/uL (ref 0.0–0.1)
Basophils Relative: 0.3 % (ref 0.0–3.0)
EOS ABS: 0.1 10*3/uL (ref 0.0–0.7)
Eosinophils Relative: 1.3 % (ref 0.0–5.0)
HCT: 46 % (ref 39.0–52.0)
HEMOGLOBIN: 15.6 g/dL (ref 13.0–17.0)
LYMPHS ABS: 1.8 10*3/uL (ref 0.7–4.0)
LYMPHS PCT: 26.9 % (ref 12.0–46.0)
MCHC: 33.9 g/dL (ref 30.0–36.0)
MCV: 90.4 fl (ref 78.0–100.0)
Monocytes Absolute: 0.4 10*3/uL (ref 0.1–1.0)
Monocytes Relative: 6 % (ref 3.0–12.0)
NEUTROS ABS: 4.4 10*3/uL (ref 1.4–7.7)
Neutrophils Relative %: 65.5 % (ref 43.0–77.0)
PLATELETS: 297 10*3/uL (ref 150.0–400.0)
RBC: 5.09 Mil/uL (ref 4.22–5.81)
RDW: 13.7 % (ref 11.5–15.5)
WBC: 6.7 10*3/uL (ref 4.0–10.5)

## 2014-05-21 LAB — BASIC METABOLIC PANEL
BUN: 16 mg/dL (ref 6–23)
CALCIUM: 9.3 mg/dL (ref 8.4–10.5)
CO2: 28 meq/L (ref 19–32)
Chloride: 104 mEq/L (ref 96–112)
Creatinine, Ser: 1 mg/dL (ref 0.4–1.5)
GFR: 82.51 mL/min (ref >60.00)
GLUCOSE: 101 mg/dL — AB (ref 70–99)
Potassium: 4.6 mEq/L (ref 3.5–5.1)
SODIUM: 139 meq/L (ref 135–145)

## 2014-05-21 LAB — LIPID PANEL
CHOLESTEROL: 200 mg/dL (ref 0–200)
HDL: 48.1 mg/dL (ref >39.00)
LDL Cholesterol: 131 mg/dL — ABNORMAL HIGH (ref 0–99)
NONHDL: 151.9
Total CHOL/HDL Ratio: 4
Triglycerides: 104 mg/dL (ref 0.0–149.0)
VLDL: 20.8 mg/dL (ref 0.0–40.0)

## 2014-05-21 LAB — HEPATIC FUNCTION PANEL
ALT: 25 U/L (ref 0–53)
AST: 24 U/L (ref 0–37)
Albumin: 4.1 g/dL (ref 3.5–5.2)
Alkaline Phosphatase: 44 U/L (ref 39–117)
BILIRUBIN TOTAL: 1 mg/dL (ref 0.2–1.2)
Bilirubin, Direct: 0.2 mg/dL (ref 0.0–0.3)
Total Protein: 7.1 g/dL (ref 6.0–8.3)

## 2014-05-21 LAB — TSH: TSH: 0.97 u[IU]/mL (ref 0.35–4.50)

## 2014-05-21 LAB — PSA: PSA: 0.33 ng/mL (ref 0.10–4.00)

## 2014-05-28 ENCOUNTER — Ambulatory Visit (INDEPENDENT_AMBULATORY_CARE_PROVIDER_SITE_OTHER): Payer: BC Managed Care – PPO | Admitting: Internal Medicine

## 2014-05-28 ENCOUNTER — Encounter: Payer: Self-pay | Admitting: Internal Medicine

## 2014-05-28 VITALS — BP 146/84 | HR 112 | Temp 97.9°F | Resp 20 | Ht 72.5 in | Wt 203.0 lb

## 2014-05-28 DIAGNOSIS — Z23 Encounter for immunization: Secondary | ICD-10-CM

## 2014-05-28 DIAGNOSIS — Z0000 Encounter for general adult medical examination without abnormal findings: Secondary | ICD-10-CM

## 2014-05-28 NOTE — Progress Notes (Signed)
Pre visit review using our clinic review tool, if applicable. No additional management support is needed unless otherwise documented below in the visit note. 

## 2014-05-28 NOTE — Progress Notes (Signed)
Subjective:    Patient ID: Dustin [name], male    DOB: 07/12/1961, 53 y.o.   MRN: [id number]  HPI 53 year old patient who is in today for a preventive health examination. He has been on high blood pressure medications in the past, but more recently these have been discontinued.  He continues to track home blood pressure readings, but denies readings. No concerns or complaints today  Past Medical History  Diagnosis Date  . 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 Marital Status: Married    Spouse Name: N/A    Number of Children: N/A  . Years of Education: N/A   Occupational History  . Not on file.   Social History Main Topics  . Smoking status: Never Smoker   . Smokeless tobacco: Never Used  . Alcohol Use: No  . Drug Use: No  . Sexual Activity: Not on file   Other Topics Concern  . Not on file   Social History Narrative  . No narrative on file    Past Surgical History  Procedure Laterality Date  . Cholecystectomy  2003  . Umbilical hernia repair  2003    Family History  Problem Relation Age of Onset  . Hypertension Mother   . Cancer Sister     melanoma  . Heart disease Sister     heart attack 11/2010 - stents placed    Allergies  Allergen Reactions  . Procaine Hcl     Current Outpatient Prescriptions on File Prior to Visit  Medication Sig Dispense Refill  . aspirin 81 MG tablet Take 81 mg by mouth daily.       No current facility-administered medications on file prior to visit.    BP 146/84  Pulse 112  Temp(Src) 97.9 F (36.6 C) (Oral)  Resp 20  Ht 6' 0.5" (1.842 m)  Wt 203 lb (92.08 kg)  BMI 27.14 kg/m2  SpO2 97%      Review of Systems  Constitutional: Negative for fever, chills, activity change, appetite change and fatigue.  HENT: Negative for congestion, dental problem, ear pain, hearing loss, mouth sores, rhinorrhea, sinus pressure, sneezing, tinnitus, trouble swallowing and voice change.   Eyes: Negative for  photophobia, pain, redness and visual disturbance.  Respiratory: Negative for apnea, cough, choking, chest tightness, shortness of breath and wheezing.   Cardiovascular: Negative for chest pain, palpitations and leg swelling.  Gastrointestinal: Negative for nausea, vomiting, abdominal pain, diarrhea, constipation, blood in stool, abdominal distention, anal bleeding and rectal pain.  Genitourinary: Negative for dysuria, urgency, frequency, hematuria, flank pain, decreased urine volume, discharge, penile swelling, scrotal swelling, difficulty urinating, genital sores and testicular pain.  Musculoskeletal: Negative for arthralgias, back pain, gait problem, joint swelling, myalgias, neck pain and neck stiffness.  Skin: Negative for color change, rash and wound.  Neurological: Negative for dizziness, tremors, seizures, syncope, facial asymmetry, speech difficulty, weakness, light-headedness, numbness and headaches.  Hematological: Negative for adenopathy. Does not bruise/bleed easily.  Psychiatric/Behavioral: Negative for suicidal ideas, hallucinations, behavioral problems, confusion, sleep disturbance, self-injury, dysphoric mood, decreased concentration and agitation. The patient is not nervous/anxious.        Objective:   Physical Exam  Constitutional: He appears well-developed and well-nourished.  HENT:  Head: Normocephalic and atraumatic.  Right Ear: External ear normal.  Left Ear: External ear normal.  Nose: Nose normal.  Mouth/Throat: Oropharynx is clear and moist.  Eyes: Conjunctivae and EOM are normal. Pupils are equal, round, and reactive to light. No  scleral icterus.  Neck: Normal range of motion. Neck supple. No JVD present. No thyromegaly present.  Cardiovascular: Regular rhythm, normal heart sounds and intact distal pulses.  Exam reveals no gallop and no friction rub.   No murmur heard. Pulmonary/Chest: Effort normal and breath sounds normal. He exhibits no tenderness.  Abdominal:  Soft. Bowel sounds are normal. He exhibits no distension and no mass. There is no tenderness.  Genitourinary: Prostate normal and penis normal.  Musculoskeletal: Normal range of motion. He exhibits no edema and no tenderness.  Lymphadenopathy:    He has no cervical adenopathy.  Neurological: He is alert. He has normal reflexes. No cranial nerve deficit. Coordination normal.  Skin: Skin is warm and dry. No rash noted.  Psychiatric: He has a normal mood and affect. His behavior is normal.          Assessment & Plan:   Preventive health examination History of hypertension.  Presently normotensive off medications.  We'll continue to observe off medication.  Home blood pressure monitor and recommended.  Return in one year or when necessary

## 2014-05-28 NOTE — Patient Instructions (Addendum)

## 2015-01-29 ENCOUNTER — Encounter (HOSPITAL_COMMUNITY): Payer: Self-pay | Admitting: *Deleted

## 2015-01-29 ENCOUNTER — Emergency Department (HOSPITAL_COMMUNITY): Payer: BLUE CROSS/BLUE SHIELD

## 2015-01-29 ENCOUNTER — Emergency Department (HOSPITAL_COMMUNITY)
Admission: EM | Admit: 2015-01-29 | Discharge: 2015-01-29 | Disposition: A | Discharge status: Home / Self Care | Payer: BLUE CROSS/BLUE SHIELD | Attending: Emergency Medicine | Admitting: Emergency Medicine

## 2015-01-29 ENCOUNTER — Telehealth: Payer: Self-pay | Admitting: Emergency Medicine

## 2015-01-29 DIAGNOSIS — R109 Unspecified abdominal pain: Secondary | ICD-10-CM | POA: Diagnosis present

## 2015-01-29 DIAGNOSIS — Z7982 Long term (current) use of aspirin: Secondary | ICD-10-CM | POA: Diagnosis not present

## 2015-01-29 DIAGNOSIS — Z9049 Acquired absence of other specified parts of digestive tract: Secondary | ICD-10-CM | POA: Insufficient documentation

## 2015-01-29 DIAGNOSIS — N23 Unspecified renal colic: Secondary | ICD-10-CM | POA: Diagnosis not present

## 2015-01-29 LAB — COMPREHENSIVE METABOLIC PANEL
ALBUMIN: 4 g/dL (ref 3.5–5.2)
ALK PHOS: 50 U/L (ref 39–117)
ALT: 38 U/L (ref 0–53)
AST: 33 U/L (ref 0–37)
Anion gap: 10 (ref 5–15)
BUN: 12 mg/dL (ref 6–23)
CALCIUM: 9.4 mg/dL (ref 8.4–10.5)
CO2: 25 mmol/L (ref 19–32)
Chloride: 103 mmol/L (ref 96–112)
Creatinine, Ser: 1.14 mg/dL (ref 0.50–1.35)
GFR calc Af Amer: 82 mL/min — ABNORMAL LOW (ref >90)
GFR calc non Af Amer: 71 mL/min — ABNORMAL LOW (ref >90)
Glucose, Bld: 120 mg/dL — ABNORMAL HIGH (ref 70–99)
Potassium: 3.7 mmol/L (ref 3.5–5.1)
Sodium: 138 mmol/L (ref 135–145)
TOTAL PROTEIN: 7.4 g/dL (ref 6.0–8.3)
Total Bilirubin: 0.8 mg/dL (ref 0.3–1.2)

## 2015-01-29 LAB — CBC WITH DIFFERENTIAL/PLATELET
BASOS ABS: 0 10*3/uL (ref 0.0–0.1)
Basophils Relative: 0 % (ref 0–1)
EOS PCT: 1 % (ref 0–5)
Eosinophils Absolute: 0.1 10*3/uL (ref 0.0–0.7)
HEMATOCRIT: 44.9 % (ref 39.0–52.0)
Hemoglobin: 16 g/dL (ref 13.0–17.0)
LYMPHS ABS: 1.8 10*3/uL (ref 0.7–4.0)
LYMPHS PCT: 24 % (ref 12–46)
MCH: 30.9 pg (ref 26.0–34.0)
MCHC: 35.6 g/dL (ref 30.0–36.0)
MCV: 86.7 fL (ref 78.0–100.0)
MONO ABS: 0.7 10*3/uL (ref 0.1–1.0)
MONOS PCT: 10 % (ref 3–12)
NEUTROS ABS: 4.6 10*3/uL (ref 1.7–7.7)
Neutrophils Relative %: 65 % (ref 43–77)
Platelets: 247 10*3/uL (ref 150–400)
RBC: 5.18 MIL/uL (ref 4.22–5.81)
RDW: 12.7 % (ref 11.5–15.5)
WBC: 7.2 10*3/uL (ref 4.0–10.5)

## 2015-01-29 LAB — URINALYSIS, ROUTINE W REFLEX MICROSCOPIC
BILIRUBIN URINE: NEGATIVE
Glucose, UA: NEGATIVE mg/dL
KETONES UR: NEGATIVE mg/dL
LEUKOCYTES UA: NEGATIVE
NITRITE: NEGATIVE
PROTEIN: NEGATIVE mg/dL
Specific Gravity, Urine: 1.027 (ref 1.005–1.030)
UROBILINOGEN UA: 0.2 mg/dL (ref 0.0–1.0)
pH: 5.5 (ref 5.0–8.0)

## 2015-01-29 LAB — URINE MICROSCOPIC-ADD ON

## 2015-01-29 LAB — LIPASE, BLOOD: Lipase: 27 U/L (ref 11–59)

## 2015-01-29 MED ORDER — TAMSULOSIN HCL 0.4 MG PO CAPS: 0.4000 mg | ORAL_CAPSULE | Freq: Every day | ORAL | Status: DC

## 2015-01-29 MED ORDER — ONDANSETRON 4 MG PO TBDP: ORAL_TABLET | ORAL | Status: DC

## 2015-01-29 MED ORDER — MORPHINE SULFATE 4 MG/ML IJ SOLN
4.0000 mg | Freq: Once | INTRAMUSCULAR | Status: AC
Start: 2015-01-29 — End: 2015-01-29
  Administered 2015-01-29: 4 mg via INTRAVENOUS
  Filled 2015-01-29: qty 1

## 2015-01-29 MED ORDER — SODIUM CHLORIDE 0.9 % IV BOLUS (SEPSIS)
1000.0000 mL | Freq: Once | INTRAVENOUS | Status: AC
  Administered 2015-01-29: 1000 mL via INTRAVENOUS

## 2015-01-29 MED ORDER — ONDANSETRON HCL 4 MG/2ML IJ SOLN
4.0000 mg | Freq: Once | INTRAMUSCULAR | Status: AC
Start: 2015-01-29 — End: 2015-01-29
  Administered 2015-01-29: 4 mg via INTRAVENOUS
  Filled 2015-01-29: qty 2

## 2015-01-29 MED ORDER — KETOROLAC TROMETHAMINE 30 MG/ML IJ SOLN
30.0000 mg | Freq: Once | INTRAMUSCULAR | Status: AC
  Administered 2015-01-29: 30 mg via INTRAVENOUS
  Filled 2015-01-29: qty 1

## 2015-01-29 MED ORDER — KETOROLAC TROMETHAMINE 10 MG PO TABS: 10.0000 mg | ORAL_TABLET | Freq: Four times a day (QID) | ORAL | Status: DC | PRN

## 2015-01-29 MED ORDER — OXYCODONE-ACETAMINOPHEN 5-325 MG PO TABS: 1.0000 | ORAL_TABLET | ORAL | Status: DC | PRN

## 2015-01-29 MED ORDER — MORPHINE SULFATE 4 MG/ML IJ SOLN
8.0000 mg | Freq: Once | INTRAMUSCULAR | Status: AC
  Administered 2015-01-29: 8 mg via INTRAVENOUS
  Filled 2015-01-29: qty 2

## 2015-01-29 NOTE — Discharge Instructions (Signed)

## 2015-01-29 NOTE — ED Notes (Signed)
Pt vomiting in room. 

## 2015-01-29 NOTE — ED Notes (Signed)
MD at bedside. 

## 2015-01-29 NOTE — ED Notes (Signed)
Pt stated that he had urgency earlier this morning.

## 2015-01-29 NOTE — ED Notes (Signed)
Dr. Delo at the bedside 

## 2015-01-29 NOTE — ED Notes (Signed)
Patient presents with c/o left flank pain and difficulty urinating.  When he does urinate states it burns.  +nausea

## 2015-01-29 NOTE — ED Provider Notes (Signed)
CSN: [id number]     Arrival date & time 01/29/15  0445 History   First MD Initiated Contact with Patient 01/29/15 0503     Chief Complaint  Patient presents with  . Flank Pain     (Consider location/radiation/quality/duration/timing/severity/associated sxs/prior Treatment) HPI Patient presents with acute onset left flank pain radiating to the left side of the abdomen starting prior to arrival. Patient is nauseated and currently vomiting. States he was having urinary hesitancy earlier in the evening. Denies any fever or chills. No previous symptoms similar symptoms. No diarrhea. No blood in the urine. Past Medical History  Diagnosis Date  . 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 Cholecystectomy  2003  . Umbilical hernia repair  2003   Family History  Problem Relation Age of Onset  . Hypertension Mother   . Cancer Sister     melanoma  . Heart disease Sister     heart attack 11/2010 - stents placed   History  Substance Use Topics  . Smoking status: Never Smoker   . Smokeless tobacco: Never Used  . Alcohol Use: No    Review of Systems  Constitutional: Positive for diaphoresis. Negative for fever and chills.  Respiratory: Negative for shortness of breath.   Cardiovascular: Negative for chest pain.  Gastrointestinal: Positive for nausea, vomiting and abdominal pain. Negative for diarrhea.  Genitourinary: Positive for flank pain and difficulty urinating. Negative for dysuria and hematuria.  Musculoskeletal: Positive for back pain. Negative for neck pain and neck stiffness.  Neurological: Negative for dizziness, weakness, light-headedness, numbness and headaches.  All other systems reviewed and are negative.     Allergies  Other  Home Medications   Prior to Admission medications   Medication Sig Start Date End Date Taking? Authorizing Provider  aspirin 81 MG tablet Take 81 mg by mouth daily as needed for pain.    Yes  Historical Provider, MD  ketorolac (TORADOL) 10 MG tablet Take 1 tablet (10 mg total) by mouth every 6 (six) hours as needed for moderate pain or severe pain. 01/29/15   Loren Raceravid Khaliyah Northrop, MD  ondansetron (ZOFRAN ODT) 4 MG disintegrating tablet 4mg  ODT q4 hours prn nausea/vomit 01/29/15   Loren Raceravid Anari Evitt, MD  oxyCODONE-acetaminophen (PERCOCET) 5-325 MG per tablet Take 1-2 tablets by mouth every 4 (four) hours as needed for severe pain. 01/29/15   Loren Raceravid Nykeem Citro, MD  tamsulosin (FLOMAX) 0.4 MG CAPS capsule Take 1 capsule (0.4 mg total) by mouth daily. 01/29/15   Loren Raceravid Lila Lufkin, MD   BP 137/80 mmHg  Pulse 78  Temp(Src) 97.6 F (36.4 C) (Oral)  Resp 16  Ht 6\' 1"  (1.854 m)  Wt 206 lb (93.441 kg)  BMI 27.18 kg/m2  SpO2 96% Physical Exam  Constitutional: He is oriented to person, place, and time. He appears well-developed and well-nourished. He appears distressed.  Actively vomiting  HENT:  Head: Normocephalic and atraumatic.  Mouth/Throat: Oropharynx is clear and moist.  Eyes: EOM are normal. Pupils are equal, round, and reactive to light.  Neck: Normal range of motion. Neck supple.  Cardiovascular: Normal rate and regular rhythm.   Pulmonary/Chest: Effort normal and breath sounds normal. No respiratory distress. He has no wheezes. He has no rales. He exhibits no tenderness.  Abdominal: Soft. Bowel sounds are normal. He exhibits no distension and no mass. There is no tenderness. There is no rebound and no guarding.  Musculoskeletal: Normal range of motion. He exhibits tenderness. He exhibits no edema.  Mild  left-sided CVA tenderness to percussion  Neurological: He is alert and oriented to person, place, and time.  Moves all extremities without deficit. Sensation is grossly intact.  Skin: Skin is warm. No rash noted. He is diaphoretic. No erythema.  Psychiatric: He has a normal mood and affect. His behavior is normal.  Nursing note and vitals reviewed.   ED Course  Procedures (including  critical care time) Labs Review Labs Reviewed  COMPREHENSIVE METABOLIC PANEL - Abnormal; Notable for the following:    Glucose, Bld 120 (*)    GFR calc non Af Amer 71 (*)    GFR calc Af Amer 82 (*)    All other components within normal limits  URINALYSIS, ROUTINE W REFLEX MICROSCOPIC - Abnormal; Notable for the following:    Hgb urine dipstick LARGE (*)    All other components within normal limits  CBC WITH DIFFERENTIAL/PLATELET  LIPASE, BLOOD  URINE MICROSCOPIC-ADD ON    Imaging Review No results found.   EKG Interpretation None      MDM   Final diagnoses:  Left flank pain  Renal colic on left side    Transfer care to Dr. Judd Lien pending pain control. Anticipate discharge home with urology follow-up as needed.    Loren Racer, MD 01/31/15 [phone]

## 2015-01-29 NOTE — ED Provider Notes (Signed)
Care assumed from Dr. Ranae PalmsYelverton at shift change. Patient has a 4 mm stone at the left UVJ. He is feeling much better. Will be discharged to home, to return as needed.  Dustin Lyonsouglas Delisha Peaden, MD 01/29/15 [phone]

## 2016-01-05 ENCOUNTER — Other Ambulatory Visit (INDEPENDENT_AMBULATORY_CARE_PROVIDER_SITE_OTHER): Payer: BLUE CROSS/BLUE SHIELD

## 2016-01-05 DIAGNOSIS — Z0000 Encounter for general adult medical examination without abnormal findings: Secondary | ICD-10-CM | POA: Diagnosis not present

## 2016-01-05 LAB — CBC WITH DIFFERENTIAL/PLATELET
BASOS ABS: 0 10*3/uL (ref 0.0–0.1)
Basophils Relative: 0.3 % (ref 0.0–3.0)
EOS ABS: 0.1 10*3/uL (ref 0.0–0.7)
Eosinophils Relative: 1.8 % (ref 0.0–5.0)
HEMATOCRIT: 44.5 % (ref 39.0–52.0)
Hemoglobin: 15.3 g/dL (ref 13.0–17.0)
LYMPHS PCT: 25.1 % (ref 12.0–46.0)
Lymphs Abs: 2 10*3/uL (ref 0.7–4.0)
MCHC: 34.4 g/dL (ref 30.0–36.0)
MCV: 89 fl (ref 78.0–100.0)
Monocytes Absolute: 0.6 10*3/uL (ref 0.1–1.0)
Monocytes Relative: 7.3 % (ref 3.0–12.0)
NEUTROS ABS: 5.3 10*3/uL (ref 1.4–7.7)
NEUTROS PCT: 65.5 % (ref 43.0–77.0)
PLATELETS: 321 10*3/uL (ref 150.0–400.0)
RBC: 5.01 Mil/uL (ref 4.22–5.81)
RDW: 13.3 % (ref 11.5–15.5)
WBC: 8.1 10*3/uL (ref 4.0–10.5)

## 2016-01-05 LAB — BASIC METABOLIC PANEL
BUN: 14 mg/dL (ref 6–23)
CHLORIDE: 105 meq/L (ref 96–112)
CO2: 29 meq/L (ref 19–32)
Calcium: 9.8 mg/dL (ref 8.4–10.5)
Creatinine, Ser: 0.99 mg/dL (ref 0.40–1.50)
GFR: 82.98 mL/min (ref >60.00)
GLUCOSE: 105 mg/dL — AB (ref 70–99)
POTASSIUM: 4.6 meq/L (ref 3.5–5.1)
Sodium: 142 mEq/L (ref 135–145)

## 2016-01-05 LAB — POC URINALSYSI DIPSTICK (AUTOMATED)
BILIRUBIN UA: NEGATIVE
Blood, UA: NEGATIVE
GLUCOSE UA: NEGATIVE
KETONES UA: NEGATIVE
Leukocytes, UA: NEGATIVE
Nitrite, UA: NEGATIVE
Protein, UA: NEGATIVE
Spec Grav, UA: >=1.03
Urobilinogen, UA: 0.2
pH, UA: 5.5

## 2016-01-05 LAB — LIPID PANEL
CHOL/HDL RATIO: 4
Cholesterol: 178 mg/dL (ref 0–200)
HDL: 45.4 mg/dL (ref >39.00)
LDL Cholesterol: 109 mg/dL — ABNORMAL HIGH (ref 0–99)
NONHDL: 132.29
Triglycerides: 117 mg/dL (ref 0.0–149.0)
VLDL: 23.4 mg/dL (ref 0.0–40.0)

## 2016-01-05 LAB — PSA: PSA: 0.36 ng/mL (ref 0.10–4.00)

## 2016-01-05 LAB — TSH: TSH: 1.59 u[IU]/mL (ref 0.35–4.50)

## 2016-01-05 LAB — HEPATIC FUNCTION PANEL
ALK PHOS: 46 U/L (ref 39–117)
ALT: 16 U/L (ref 0–53)
AST: 22 U/L (ref 0–37)
Albumin: 4.4 g/dL (ref 3.5–5.2)
BILIRUBIN DIRECT: 0.1 mg/dL (ref 0.0–0.3)
Total Bilirubin: 0.6 mg/dL (ref 0.2–1.2)
Total Protein: 7.5 g/dL (ref 6.0–8.3)

## 2016-01-09 ENCOUNTER — Ambulatory Visit (INDEPENDENT_AMBULATORY_CARE_PROVIDER_SITE_OTHER): Payer: BLUE CROSS/BLUE SHIELD | Admitting: Internal Medicine

## 2016-01-09 ENCOUNTER — Encounter: Payer: Self-pay | Admitting: Internal Medicine

## 2016-01-09 VITALS — BP 170/110 | Temp 98.0°F | Ht 72.5 in | Wt 204.0 lb

## 2016-01-09 DIAGNOSIS — R7302 Impaired glucose tolerance (oral): Secondary | ICD-10-CM

## 2016-01-09 DIAGNOSIS — Z0000 Encounter for general adult medical examination without abnormal findings: Secondary | ICD-10-CM

## 2016-01-09 DIAGNOSIS — N23 Unspecified renal colic: Secondary | ICD-10-CM

## 2016-01-09 DIAGNOSIS — I10 Essential (primary) hypertension: Secondary | ICD-10-CM | POA: Insufficient documentation

## 2016-01-09 MED ORDER — LISINOPRIL-HYDROCHLOROTHIAZIDE 20-12.5 MG PO TABS: 1.0000 | ORAL_TABLET | Freq: Every day | ORAL | Status: DC

## 2016-01-09 NOTE — Progress Notes (Signed)
Pre visit review using our clinic review tool, if applicable. No additional management support is needed unless otherwise documented below in the visit note. 

## 2016-01-09 NOTE — Progress Notes (Signed)
Subjective:    Patient ID: Dustin [name], male    DOB: 1961/01/25, 55 y.o.   MRN: [id number]  HPI  55 year old patient who is in today for a preventive health examination. He has been on high blood pressure medications in the past, but presently tracks home readings; blood pressure readings are generally high normal to mildly elevated.  Blood pressure on arrival today 160 over 100  He was treated for renal colic March 2016  Family history mother had cerebrovascular disease.  Father status post CABG younger sister status post stent  No screening colonoscopy      Past Medical History  Diagnosis Date  . 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 Marital Status: Married    Spouse Name: N/A  . Number of Children: N/A  . Years of Education: N/A   Occupational History  . Not on file.   Social History Main Topics  . Smoking status: Never Smoker   . Smokeless tobacco: Never Used  . Alcohol Use: No  . Drug Use: No  . Sexual Activity: Yes   Other Topics Concern  . Not on file   Social History Narrative    Past Surgical History  Procedure Laterality Date  . Cholecystectomy  2003  . Umbilical hernia repair  2003    Family History  Problem Relation Age of Onset  . Hypertension Mother   . Cancer Sister     melanoma  . Heart disease Sister     heart attack 11/2010 - stents placed    Allergies  Allergen Reactions  . Other Swelling    novacaine    Current Outpatient Prescriptions on File Prior to Visit  Medication Sig Dispense Refill  . aspirin 81 MG tablet Take 81 mg by mouth daily as needed for pain.      No current facility-administered medications on file prior to visit.    BP 170/110 mmHg  Temp(Src) 98 F (36.7 C) (Oral)  Ht 6' 0.5" (1.842 m)  Wt 204 lb (92.534 kg)  BMI 27.27 kg/m2      Review of Systems  Constitutional: Negative for fever, chills, activity change, appetite change and fatigue.  HENT: Negative for  congestion, dental problem, ear pain, hearing loss, mouth sores, rhinorrhea, sinus pressure, sneezing, tinnitus, trouble swallowing and voice change.   Eyes: Negative for photophobia, pain, redness and visual disturbance.  Respiratory: Negative for apnea, cough, choking, chest tightness, shortness of breath and wheezing.   Cardiovascular: Negative for chest pain, palpitations and leg swelling.  Gastrointestinal: Negative for nausea, vomiting, abdominal pain, diarrhea, constipation, blood in stool, abdominal distention, anal bleeding and rectal pain.  Genitourinary: Negative for dysuria, urgency, frequency, hematuria, flank pain, decreased urine volume, discharge, penile swelling, scrotal swelling, difficulty urinating, genital sores and testicular pain.  Musculoskeletal: Negative for myalgias, back pain, joint swelling, arthralgias, gait problem, neck pain and neck stiffness.  Skin: Negative for color change, rash and wound.  Neurological: Negative for dizziness, tremors, seizures, syncope, facial asymmetry, speech difficulty, weakness, light-headedness, numbness and headaches.  Hematological: Negative for adenopathy. Does not bruise/bleed easily.  Psychiatric/Behavioral: Negative for suicidal ideas, hallucinations, behavioral problems, confusion, sleep disturbance, self-injury, dysphoric mood, decreased concentration and agitation. The patient is not nervous/anxious.        Objective:   Physical Exam  Constitutional: He appears well-developed and well-nourished.  Blood pressure 160/100 on arrival  HENT:  Head: Normocephalic and atraumatic.  Right Ear: External ear normal.  Left  Ear: External ear normal.  Nose: Nose normal.  Mouth/Throat: Oropharynx is clear and moist.  Eyes: Conjunctivae and EOM are normal. Pupils are equal, round, and reactive to light. No scleral icterus.  Neck: Normal range of motion. Neck supple. No JVD present. No thyromegaly present.  Cardiovascular: Regular rhythm,  normal heart sounds and intact distal pulses.  Exam reveals no gallop and no friction rub.   No murmur heard. Pulmonary/Chest: Effort normal and breath sounds normal. He exhibits no tenderness.  Abdominal: Soft. Bowel sounds are normal. He exhibits no distension and no mass. There is no tenderness.  Genitourinary: Prostate normal and penis normal.  Musculoskeletal: Normal range of motion. He exhibits no edema or tenderness.  Lymphadenopathy:    He has no cervical adenopathy.  Neurological: He is alert. He has normal reflexes. No cranial nerve deficit. Coordination normal.  Skin: Skin is warm and dry. No rash noted.  Psychiatric: He has a normal mood and affect. His behavior is normal.          Assessment & Plan:   Preventive health examination Hypertension.  Will place on lisinopril hydrochlorothiazide.  Continue home blood pressure monitoring History renal colic  Schedule colonoscopy

## 2016-01-09 NOTE — Patient Instructions (Signed)
Limit your sodium (Salt) intake  Please check your blood pressure on a regular basis.  If it is consistently greater than 140/90, please make an office appointment.  Return in 3 months for follow-up  Schedule your colonoscopy to help detect colon cancer.

## 2016-01-22 ENCOUNTER — Encounter: Payer: Self-pay | Admitting: Adult Health

## 2016-01-22 ENCOUNTER — Ambulatory Visit (INDEPENDENT_AMBULATORY_CARE_PROVIDER_SITE_OTHER): Payer: BLUE CROSS/BLUE SHIELD | Admitting: Adult Health

## 2016-01-22 VITALS — BP 128/80 | HR 99 | Temp 98.0°F | Ht 72.5 in | Wt 202.0 lb

## 2016-01-22 DIAGNOSIS — J069 Acute upper respiratory infection, unspecified: Secondary | ICD-10-CM | POA: Diagnosis not present

## 2016-01-22 MED ORDER — HYDROCODONE-HOMATROPINE 5-1.5 MG/5ML PO SYRP: 5.0000 mL | ORAL_SOLUTION | Freq: Three times a day (TID) | ORAL | Status: DC | PRN

## 2016-01-22 MED ORDER — DOXYCYCLINE HYCLATE 100 MG PO CAPS: 100.0000 mg | ORAL_CAPSULE | Freq: Two times a day (BID) | ORAL | Status: DC

## 2016-01-22 NOTE — Patient Instructions (Addendum)
It was great seeing you today!  I have sent in a prescription for Doxycyline ( antibiotic) take this twice a day for 7 days.   Use the cough syrup at night - it will make you sleepy.   During the day, Mucinex and Delsym are the best things we have right now.   Follow up if no improvement.   Upper Respiratory Infection, Adult Most upper respiratory infections (URIs) are a viral infection of the air passages leading to the lungs. A URI affects the nose, throat, and upper air passages. The most common type of URI is nasopharyngitis and is typically referred to as "the common cold." URIs run their course and usually go away on their own. Most of the time, a URI does not require medical attention, but sometimes a bacterial infection in the upper airways can follow a viral infection. This is called a secondary infection. Sinus and middle ear infections are common types of secondary upper respiratory infections. Bacterial pneumonia can also complicate a URI. A URI can worsen asthma and chronic obstructive pulmonary disease (COPD). Sometimes, these complications can require emergency medical care and may be life threatening.  CAUSES Almost all URIs are caused by viruses. A virus is a type of germ and can spread from one person to another.  RISKS FACTORS You may be at risk for a URI if:   You smoke.   You have chronic heart or lung disease.  You have a weakened defense (immune) system.   You are very young or very old.   You have nasal allergies or asthma.  You work in crowded or poorly ventilated areas.  You work in health care facilities or schools. SIGNS AND SYMPTOMS  Symptoms typically develop 2-3 days after you come in contact with a cold virus. Most viral URIs last 7-10 days. However, viral URIs from the influenza virus (flu virus) can last 14-18 days and are typically more severe. Symptoms may include:   Runny or stuffy (congested) nose.   Sneezing.   Cough.   Sore  throat.   Headache.   Fatigue.   Fever.   Loss of appetite.   Pain in your forehead, behind your eyes, and over your cheekbones (sinus pain).  Muscle aches.  DIAGNOSIS  Your health care provider may diagnose a URI by:  Physical exam.  Tests to check that your symptoms are not due to another condition such as:  Strep throat.  Sinusitis.  Pneumonia.  Asthma. TREATMENT  A URI goes away on its own with time. It cannot be cured with medicines, but medicines may be prescribed or recommended to relieve symptoms. Medicines may help:  Reduce your fever.  Reduce your cough.  Relieve nasal congestion. HOME CARE INSTRUCTIONS   Take medicines only as directed by your health care provider.   Gargle warm saltwater or take cough drops to comfort your throat as directed by your health care provider.  Use a warm mist humidifier or inhale steam from a shower to increase air moisture. This may make it easier to breathe.  Drink enough fluid to keep your urine clear or pale yellow.   Eat soups and other clear broths and maintain good nutrition.   Rest as needed.   Return to work when your temperature has returned to normal or as your health care provider advises. You may need to stay home longer to avoid infecting others. You can also use a face mask and careful hand washing to prevent spread of the virus.  Increase the usage of your inhaler if you have asthma.   Do not use any tobacco products, including cigarettes, chewing tobacco, or electronic cigarettes. If you need help quitting, ask your health care provider. PREVENTION  The best way to protect yourself from getting a cold is to practice good hygiene.   Avoid oral or hand contact with people with cold symptoms.   Wash your hands often if contact occurs.  There is no clear evidence that vitamin C, vitamin E, echinacea, or exercise reduces the chance of developing a cold. However, it is always recommended to  get plenty of rest, exercise, and practice good nutrition.  SEEK MEDICAL CARE IF:   You are getting worse rather than better.   Your symptoms are not controlled by medicine.   You have chills.  You have worsening shortness of breath.  You have brown or red mucus.  You have yellow or brown nasal discharge.  You have pain in your face, especially when you bend forward.  You have a fever.  You have swollen neck glands.  You have pain while swallowing.  You have white areas in the back of your throat. SEEK IMMEDIATE MEDICAL CARE IF:   You have severe or persistent:  Headache.  Ear pain.  Sinus pain.  Chest pain.  You have chronic lung disease and any of the following:  Wheezing.  Prolonged cough.  Coughing up blood.  A change in your usual mucus.  You have a stiff neck.  You have changes in your:  Vision.  Hearing.  Thinking.  Mood. MAKE SURE YOU:   Understand these instructions.  Will watch your condition.  Will get help right away if you are not doing well or get worse.   This information is not intended to replace advice given to you by your health care provider. Make sure you discuss any questions you have with your health care provider.   Document Released: 04/20/2001 Document Revised: 03/11/2015 Document Reviewed: 01/30/2014 Elsevier Interactive Patient Education [employer].

## 2016-01-22 NOTE — Progress Notes (Signed)
Subjective:    Patient ID: Dustin [name], male    DOB: 17-Aug-1961, 55 y.o.   MRN: [id number]  HPI  55 year old male who presents to the office for three weeks of cough, congestion, headache, abdominal cramps, diarrhea, fatigue, and chills. He's had multiple family members who have also fallen ill with the same symptoms. Per patient his family members have been treated with various antibiotics and all have had  resolution of symptoms  Review of Systems  Constitutional: Positive for chills, activity change and fatigue. Negative for fever, diaphoresis, appetite change and unexpected weight change.  HENT: Positive for congestion. Negative for ear discharge, postnasal drip, rhinorrhea and sinus pressure.   Respiratory: Positive for cough.   Gastrointestinal: Positive for abdominal pain and diarrhea.  Neurological: Positive for headaches.  Hematological: Negative.   All other systems reviewed and are negative.  Past Medical History  Diagnosis Date  . 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 Marital Status: Married    Spouse Name: N/A  . Number of Children: N/A  . Years of Education: N/A   Occupational History  . Not on file.   Social History Main Topics  . Smoking status: Never Smoker   . Smokeless tobacco: Never Used  . Alcohol Use: No  . Drug Use: No  . Sexual Activity: Yes   Other Topics Concern  . Not on file   Social History Narrative    Past Surgical History  Procedure Laterality Date  . Cholecystectomy  2003  . Umbilical hernia repair  2003    Family History  Problem Relation Age of Onset  . Hypertension Mother   . Cancer Sister     melanoma  . Heart disease Sister     heart attack 11/2010 - stents placed    Allergies  Allergen Reactions  . Other Swelling    novacaine    Current Outpatient Prescriptions on File Prior to Visit  Medication Sig Dispense Refill  . aspirin 81 MG tablet Take 81 mg by mouth daily as needed  for pain.     Marland Kitchen. lisinopril-hydrochlorothiazide (ZESTORETIC) 20-12.5 MG tablet Take 1 tablet by mouth daily. 90 tablet 3   No current facility-administered medications on file prior to visit.    BP 128/80 mmHg  Pulse 99  Temp(Src) 98 F (36.7 C) (Oral)  Ht 6' 0.5" (1.842 m)  Wt 202 lb (91.627 kg)  BMI 27.01 kg/m2  SpO2 97%       Objective:   Physical Exam  Constitutional: He is oriented to person, place, and time. He appears well-developed and well-nourished. No distress.  HENT:  Head: Normocephalic and atraumatic.  Right Ear: Hearing, tympanic membrane, external ear and ear canal normal.  Left Ear: Hearing, tympanic membrane, external ear and ear canal normal.  Nose: Mucosal edema and rhinorrhea present. Right sinus exhibits maxillary sinus tenderness. Left sinus exhibits maxillary sinus tenderness.  Mouth/Throat: Oropharynx is clear and moist. No oropharyngeal exudate.  Eyes: Conjunctivae and EOM are normal. Pupils are equal, round, and reactive to light. Right eye exhibits no discharge. Left eye exhibits no discharge. No scleral icterus.  Neck: Normal range of motion. Neck supple. No thyromegaly present.  Cardiovascular: Normal rate, regular rhythm, normal heart sounds and intact distal pulses.  Exam reveals no gallop and no friction rub.   No murmur heard. Pulmonary/Chest: Effort normal and breath sounds normal. No respiratory distress. He has no wheezes. He has no rales. He  exhibits no tenderness.  Abdominal: Soft. Bowel sounds are normal. He exhibits no distension and no mass. There is no tenderness. There is no rebound and no guarding.  Lymphadenopathy:    He has no cervical adenopathy.  Neurological: He is alert and oriented to person, place, and time.  Skin: Skin is warm and dry. No rash noted. He is not diaphoretic. No erythema. No pallor.  Psychiatric: He has a normal mood and affect. His behavior is normal. Judgment and thought content normal.  Nursing note and vitals  reviewed.     Assessment & Plan:  1. Acute upper respiratory infection - Treating as bacterial due to 3 week timeframe of symptoms - doxycycline (VIBRAMYCIN) 100 MG capsule; Take 1 capsule (100 mg total) by mouth 2 (two) times daily.  Dispense: 14 capsule; Refill: 0 - HYDROcodone-homatropine (HYCODAN) 5-1.5 MG/5ML syrup; Take 5 mLs by mouth every 8 (eight) hours as needed for cough.  Dispense: 120 mL; Refill: 0 - Mucinex during the day for cough -Stay hydrated and rest -Follow-up if no improvement

## 2016-01-22 NOTE — Progress Notes (Signed)
Pre visit review using our clinic review tool, if applicable. No additional management support is needed unless otherwise documented below in the visit note. 

## 2016-04-09 ENCOUNTER — Emergency Department (HOSPITAL_COMMUNITY)
Admission: EM | Admit: 2016-04-09 | Discharge: 2016-04-09 | Disposition: A | Discharge status: Home / Self Care | Payer: BLUE CROSS/BLUE SHIELD | Attending: Emergency Medicine | Admitting: Emergency Medicine

## 2016-04-09 ENCOUNTER — Encounter (HOSPITAL_COMMUNITY): Payer: Self-pay

## 2016-04-09 DIAGNOSIS — Z79899 Other long term (current) drug therapy: Secondary | ICD-10-CM | POA: Insufficient documentation

## 2016-04-09 DIAGNOSIS — Z7982 Long term (current) use of aspirin: Secondary | ICD-10-CM | POA: Insufficient documentation

## 2016-04-09 DIAGNOSIS — Z792 Long term (current) use of antibiotics: Secondary | ICD-10-CM | POA: Diagnosis not present

## 2016-04-09 DIAGNOSIS — M542 Cervicalgia: Secondary | ICD-10-CM | POA: Diagnosis not present

## 2016-04-09 DIAGNOSIS — Y998 Other external cause status: Secondary | ICD-10-CM | POA: Insufficient documentation

## 2016-04-09 DIAGNOSIS — Y92410 Unspecified street and highway as the place of occurrence of the external cause: Secondary | ICD-10-CM | POA: Insufficient documentation

## 2016-04-09 DIAGNOSIS — Z041 Encounter for examination and observation following transport accident: Secondary | ICD-10-CM | POA: Diagnosis not present

## 2016-04-09 DIAGNOSIS — S199XXA Unspecified injury of neck, initial encounter: Secondary | ICD-10-CM | POA: Diagnosis present

## 2016-04-09 DIAGNOSIS — Y9389 Activity, other specified: Secondary | ICD-10-CM | POA: Diagnosis not present

## 2016-04-09 MED ORDER — IBUPROFEN 600 MG PO TABS: 600.0000 mg | ORAL_TABLET | Freq: Four times a day (QID) | ORAL | Status: DC | PRN
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
## 2016-04-09 NOTE — Discharge Instructions (Signed)

## 2016-04-09 NOTE — ED Notes (Signed)
Patient Alert and oriented X4. Stable and ambulatory. Patient verbalized understanding of the discharge instructions.  Patient belongings were taken by the patient.  

## 2016-05-03 ENCOUNTER — Ambulatory Visit (INDEPENDENT_AMBULATORY_CARE_PROVIDER_SITE_OTHER): Payer: BLUE CROSS/BLUE SHIELD | Admitting: Internal Medicine

## 2016-05-03 ENCOUNTER — Encounter: Payer: Self-pay | Admitting: Internal Medicine

## 2016-05-03 VITALS — BP 122/80 | HR 104 | Temp 98.1°F | Resp 20 | Ht 72.5 in | Wt 202.0 lb

## 2016-05-03 DIAGNOSIS — R7302 Impaired glucose tolerance (oral): Secondary | ICD-10-CM

## 2016-05-03 DIAGNOSIS — I10 Essential (primary) hypertension: Secondary | ICD-10-CM | POA: Diagnosis not present

## 2016-05-03 NOTE — Patient Instructions (Signed)
Limit your sodium (Salt) intake  Please check your blood pressure on a regular basis.  If it is consistently greater than 150/90, please make an office appointment.  Return in one year for follow-up  Schedule your colonoscopy to help detect colon cancer.
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
## 2016-05-03 NOTE — Progress Notes (Signed)
Pre visit review using our clinic review tool, if applicable. No additional management support is needed unless otherwise documented below in the visit note. 

## 2016-08-01 ENCOUNTER — Emergency Department (HOSPITAL_COMMUNITY): Payer: BLUE CROSS/BLUE SHIELD

## 2016-08-01 ENCOUNTER — Encounter (HOSPITAL_COMMUNITY): Payer: Self-pay | Admitting: *Deleted

## 2016-08-01 ENCOUNTER — Emergency Department (HOSPITAL_COMMUNITY)
Admission: EM | Admit: 2016-08-01 | Discharge: 2016-08-01 | Disposition: A | Discharge status: Home / Self Care | Payer: BLUE CROSS/BLUE SHIELD | Attending: Emergency Medicine | Admitting: Emergency Medicine

## 2016-08-01 DIAGNOSIS — Z79899 Other long term (current) drug therapy: Secondary | ICD-10-CM | POA: Insufficient documentation

## 2016-08-01 DIAGNOSIS — I10 Essential (primary) hypertension: Secondary | ICD-10-CM | POA: Diagnosis not present

## 2016-08-01 DIAGNOSIS — R0789 Other chest pain: Secondary | ICD-10-CM | POA: Diagnosis not present

## 2016-08-01 DIAGNOSIS — Z7982 Long term (current) use of aspirin: Secondary | ICD-10-CM | POA: Insufficient documentation

## 2016-08-01 DIAGNOSIS — R079 Chest pain, unspecified: Secondary | ICD-10-CM | POA: Diagnosis not present

## 2016-08-01 HISTORY — DX: Essential (primary) hypertension: I10

## 2016-08-01 LAB — CBC
HCT: 41.5 % (ref 39.0–52.0)
HEMOGLOBIN: 14.3 g/dL (ref 13.0–17.0)
MCH: 31.3 pg (ref 26.0–34.0)
MCHC: 34.5 g/dL (ref 30.0–36.0)
MCV: 90.8 fL (ref 78.0–100.0)
PLATELETS: 301 10*3/uL (ref 150–400)
RBC: 4.57 MIL/uL (ref 4.22–5.81)
RDW: 12.6 % (ref 11.5–15.5)
WBC: 8.6 10*3/uL (ref 4.0–10.5)

## 2016-08-01 LAB — BASIC METABOLIC PANEL
ANION GAP: 13 (ref 5–15)
BUN: 25 mg/dL — ABNORMAL HIGH (ref 6–20)
CHLORIDE: 98 mmol/L — AB (ref 101–111)
CO2: 24 mmol/L (ref 22–32)
CREATININE: 1.32 mg/dL — AB (ref 0.61–1.24)
Calcium: 9.2 mg/dL (ref 8.9–10.3)
GFR calc non Af Amer: 58 mL/min — ABNORMAL LOW (ref >60)
Glucose, Bld: 170 mg/dL — ABNORMAL HIGH (ref 65–99)
POTASSIUM: 3.6 mmol/L (ref 3.5–5.1)
SODIUM: 135 mmol/L (ref 135–145)

## 2016-08-01 LAB — I-STAT TROPONIN, ED: Troponin i, poc: 0 ng/mL (ref 0.00–0.08)

## 2016-08-01 MED ORDER — DIAZEPAM 5 MG PO TABS
5.0000 mg | ORAL_TABLET | Freq: Once | ORAL | Status: AC
  Administered 2016-08-01: 5 mg via ORAL
  Filled 2016-08-01: qty 1

## 2016-08-01 MED ORDER — FAMOTIDINE 20 MG PO TABS
40.0000 mg | ORAL_TABLET | Freq: Once | ORAL | Status: AC
  Administered 2016-08-01: 40 mg via ORAL
  Filled 2016-08-01: qty 2

## 2016-08-01 MED ORDER — GI COCKTAIL ~~LOC~~
30.0000 mL | Freq: Once | ORAL | Status: AC
  Administered 2016-08-01: 30 mL via ORAL
  Filled 2016-08-01: qty 30

## 2016-08-01 NOTE — ED Provider Notes (Signed)
`````````````````````````````````````````````````````````````````````````````````````````````````````````````````````````````````````````````````````````````````````````````````````````````````````````````````````````````````````````````````````````````````````````````````````````````````````````````````````````````````````````````````````````````````````````````````````````````````````````````````````````````````````````````````````````````````````````````````````````````````````````````````````````````````````````````````````````````````````````````````````````````````````````````````````````````````````````````````````````````````````````````````````````````````````````````````````````````````````````````````````````` [facility] Provider Note   CSN: [id number] Arrival date & time: 08/01/16  1239     History   Chief Complaint Chief Complaint  Patient presents with  . Chest Pain    HPI TRISTEN LUCE is a 55 y.o. male.  55 year old male presents complaining of episode of feeling dizzy lightheaded was last for approximately 2-3 seconds which occurred at rest. Patient does have a history of hypertension as well as strong family history of CAD and has had persistent chest discomfort for over 10 years. He occasionally has diaphoresis with this. Denies any associated palpitations or syncope. States that today his chronic symptoms have been unchanged. He denies recent fever, cough, chills. Patient states that he worked outside with a push more yesterday and did not have any increase in his chest discomfort which he has chronically. Denies any exertional component to them. Patient does complain of increased epigastric discomfort as well as increased burping. Has a history of GERD. Symptoms became worse after he ate a [place] sandwich. Resolve spontaneously without treatment.      Past Medical History:  Diagnosis Date  . Hypertension   . 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 Essential hypertension 01/09/2016  . Renal colic 01/09/2016  . Impaired glucose tolerance 01/09/2016    Past Surgical History:  Procedure Laterality Date  . CHOLECYSTECTOMY  2003  . UMBILICAL HERNIA REPAIR  2003       Home Medications    Prior to Admission medications   Medication Sig Start Date End Date Taking? Authorizing Provider  acetaminophen (TYLENOL) 500 MG tablet Take 1,000 mg by mouth every 6 (six) hours as needed for headache (pain).   Yes Historical Provider, MD  aspirin EC 81 MG tablet Take 81 mg by mouth daily as needed (chest pain).   Yes Historical Provider, MD  lisinopril-hydrochlorothiazide (ZESTORETIC) 20-12.5 MG tablet Take 1 tablet by mouth daily. Patient taking differently: Take 1 tablet by mouth daily after supper.  01/09/16  Yes Gordy Savers, MD  Multiple Vitamin (MULTIVITAMIN WITH MINERALS) TABS tablet Take 1 tablet by mouth daily.   Yes Historical Provider, MD    Family History Family History  Problem Relation Age of Onset  . Hypertension Mother   . Cancer Sister     melanoma  . Heart disease Sister     heart attack 11/2010 - stents placed    Social History Social History  Substance Use Topics  . Smoking status: Never Smoker  . Smokeless tobacco: Never Used  . Alcohol use No     Allergies   Other   Review of Systems Review of Systems  All other systems reviewed and are negative.    Physical Exam Updated Vital Signs BP 143/80 (BP Location: Right Arm)   Pulse 78   Temp 98.1 F (36.7 C) (Oral)   Resp 17   SpO2 99%   Physical Exam  Constitutional: He is oriented to person, place, and time. He appears well-developed and well-nourished.  Non-toxic appearance. No distress.  HENT:  Head: Normocephalic and atraumatic.  Eyes: Conjunctivae, EOM and lids are normal. Pupils are equal, round, and reactive to light.  Neck: Normal range of motion. Neck supple. No tracheal deviation present. No  thyroid mass present.  Cardiovascular: Normal rate, regular rhythm and normal heart sounds.  Exam reveals no gallop.   No murmur heard. Pulmonary/Chest: Effort normal and breath sounds normal. No stridor. No respiratory  distress. He has no decreased breath sounds. He has no wheezes. He has no rhonchi. He has no rales.  Abdominal: Soft. Normal appearance and bowel sounds are normal. He exhibits no distension. There is no tenderness. There is no rebound and no CVA tenderness.  Musculoskeletal: Normal range of motion. He exhibits no edema or tenderness.  Neurological: He is alert and oriented to person, place, and time. He has normal strength. No cranial nerve deficit or sensory deficit. GCS eye subscore is 4. GCS verbal subscore is 5. GCS motor subscore is 6.  Skin: Skin is warm and dry. No abrasion and no rash noted.  Psychiatric: He has a normal mood and affect. His speech is normal and behavior is normal.  Nursing note and vitals reviewed.    ED Treatments / Results  Labs (all labs ordered are listed, but only abnormal results are displayed) Labs Reviewed  BASIC METABOLIC PANEL - Abnormal; Notable for the following:       Result Value   Chloride 98 (*)    Glucose, Bld 170 (*)    BUN 25 (*)    Creatinine, Ser 1.32 (*)    GFR calc non Af Amer 58 (*)    All other components within normal limits  CBC  I-STAT TROPOININ, ED    EKG  EKG Interpretation  Date/Time:  Sunday August 01 2016 12:46:25 EDT Ventricular Rate:  101 PR Interval:  138 QRS Duration: 68 QT Interval:  336 QTC Calculation: 435 R Axis:   64 Text Interpretation:  Sinus tachycardia with Premature atrial complexes Otherwise normal ECG Confirmed by Freida BusmanALLEN  MD, Rommel Hogston ([id number]) on 08/01/2016 4:22:14 PM       Radiology Dg Chest 2 View  Result Date: 08/01/2016 CLINICAL DATA:  Midsternal CP like a heavy pressure for 2 days; Episode of dizziness and near syncope last night; HTN meds; HAche; No known heart problems  EXAM: CHEST - 2 VIEW COMPARISON:  04/27/2008 FINDINGS: Lungs are clear. Heart size and mediastinal contours are within normal limits. No effusion. Visualized bones unremarkable. IMPRESSION: No acute cardiopulmonary disease. Electronically Signed   By: Corlis Leak  Hassell M.D.   On: 08/01/2016 13:35    Procedures Procedures (including critical care time)  Medications Ordered in ED Medications  diazepam (VALIUM) tablet 5 mg (not administered)  gi cocktail (Maalox,Lidocaine,Donnatal) (not administered)  famotidine (PEPCID) tablet 40 mg (not administered)     Initial Impression / Assessment and Plan / ED Course  I have reviewed the triage vital signs and the nursing notes.  Pertinent labs & imaging results that were available during my care of the patient were reviewed by me and considered in my medical decision making (see chart for details).  Clinical Course    Patient given GI cocktail, Pepcid, Valium and feels better. Suspect that there is some component of GERD to this. Discussed at length with the patient the possibility that this could be of cardiac etiology given his very strong family history. He is comfortable with being given cardiology referral and he is also been given return precautions  Final Clinical Impressions(s) / ED Diagnoses   Final diagnoses:  None    New Prescriptions New Prescriptions   No medications on file     Lorre NickAnthony Sofya Moustafa, MD 08/01/16 1743

## 2016-08-01 NOTE — ED Triage Notes (Signed)
Pt reports onset yesterday of not feeling well. Reports recent non productive cough. Having chest heaviness, feeling fatigued, sob and lightheaded this am. No acute distress is noted at triage, ekg done.

## 2016-08-02 ENCOUNTER — Telehealth: Payer: Self-pay | Admitting: Internal Medicine

## 2016-08-02 NOTE — Telephone Encounter (Signed)
Pt went to ED this past weekend with chest pains.  Pt states they instructed him to follow up for a fasting blood sugar and see a cardiologist. Dr Kirtland BouchardK does not have a 30 min appt this week first thing in the am. Pt not sure if he just needs a lab and then a referral to cardio. Please advise if ok to work in and block a later slot. There is a 8:30 on Friday.

## 2016-08-02 NOTE — Telephone Encounter (Signed)
Olegario MessierKathy, work pt in and block a slot please.

## 2016-08-02 NOTE — Telephone Encounter (Signed)
Please schedule

## 2016-08-03 NOTE — Telephone Encounter (Signed)
Pt has been scheduled.  °

## 2016-08-06 ENCOUNTER — Ambulatory Visit (INDEPENDENT_AMBULATORY_CARE_PROVIDER_SITE_OTHER): Payer: BLUE CROSS/BLUE SHIELD | Admitting: Internal Medicine

## 2016-08-06 ENCOUNTER — Encounter: Payer: Self-pay | Admitting: Internal Medicine

## 2016-08-06 DIAGNOSIS — I10 Essential (primary) hypertension: Secondary | ICD-10-CM | POA: Diagnosis not present

## 2016-08-06 DIAGNOSIS — R7302 Impaired glucose tolerance (oral): Secondary | ICD-10-CM

## 2016-08-06 DIAGNOSIS — R0789 Other chest pain: Secondary | ICD-10-CM | POA: Diagnosis not present

## 2016-08-06 LAB — POCT GLYCOSYLATED HEMOGLOBIN (HGB A1C): HEMOGLOBIN A1C: 5.4

## 2016-08-06 NOTE — Progress Notes (Signed)
Subjective:    Patient ID: Dustin [name], male    DOB: 08/09/1961, 55 y.o.   MRN: [id number]  HPI  55 year old patient who is seen in the ED earlier in the week.  He developed some the chest fullness and bloating.  EKG and cardiac enzymes were negative.  Symptoms occurred in the setting of diarrhea and URI symptoms with coughing. The patient has a strong family history of premature heart disease.  His father had CABG at his age and his sister also had a stent placed at age 36. He has resume usual activities and has had no exertional chest pain.  He does remain slightly weak due to his viral illness. ED evaluation revealed some mild volume repletion  Past Medical History:  Diagnosis Date  . Hypertension   . 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 Marital status: Married    Spouse name: N/A  . Number of children: N/A  . Years of education: N/A   Occupational History  . Not on file.   Social History Main Topics  . Smoking status: Never Smoker  . Smokeless tobacco: Never Used  . Alcohol use No  . Drug use: No  . Sexual activity: Yes   Other Topics Concern  . Not on file   Social History Narrative  . No narrative on file    Past Surgical History:  Procedure Laterality Date  . CHOLECYSTECTOMY  2003  . UMBILICAL HERNIA REPAIR  2003    Family History  Problem Relation Age of Onset  . Hypertension Mother   . Cancer Sister     melanoma  . Heart disease Sister     heart attack 11/2010 - stents placed    Allergies  Allergen Reactions  . Other Swelling    Reaction to novocaine at 55 years old (dentist's office)    Current Outpatient Prescriptions on File Prior to Visit  Medication Sig Dispense Refill  . acetaminophen (TYLENOL) 500 MG tablet Take 1,000 mg by mouth every 6 (six) hours as needed for headache (pain).    Marland Kitchen aspirin EC 81 MG tablet Take 81 mg by mouth daily as needed (chest pain).    Marland Kitchen lisinopril-hydrochlorothiazide  (ZESTORETIC) 20-12.5 MG tablet Take 1 tablet by mouth daily. (Patient taking differently: Take 1 tablet by mouth daily after supper. ) 90 tablet 3  . Multiple Vitamin (MULTIVITAMIN WITH MINERALS) TABS tablet Take 1 tablet by mouth daily.     No current facility-administered medications on file prior to visit.     BP 120/80 (BP Location: Right Arm, Patient Position: Sitting, Cuff Size: Normal)   Pulse (!) 108   Temp 97.9 F (36.6 C) (Oral)   Resp 20   Ht 6' 0.5" (1.842 m)   Wt 196 lb (88.9 kg)   SpO2 99%   BMI 26.22 kg/m     Review of Systems  Constitutional: Positive for activity change and fatigue. Negative for appetite change, chills and fever.  HENT: Negative for congestion, dental problem, ear pain, hearing loss, sore throat, tinnitus, trouble swallowing and voice change.   Eyes: Negative for pain, discharge and visual disturbance.  Respiratory: Positive for cough. Negative for chest tightness, wheezing and stridor.   Cardiovascular: Positive for chest pain. Negative for palpitations and leg swelling.  Gastrointestinal: Positive for diarrhea. Negative for abdominal distention, abdominal pain, blood in stool, constipation, nausea and vomiting.  Genitourinary: Negative for difficulty urinating, discharge, flank pain, genital sores, hematuria  and urgency.  Musculoskeletal: Negative for arthralgias, back pain, gait problem, joint swelling, myalgias and neck stiffness.  Skin: Negative for rash.  Neurological: Negative for dizziness, syncope, speech difficulty, weakness, numbness and headaches.  Hematological: Negative for adenopathy. Does not bruise/bleed easily.  Psychiatric/Behavioral: Negative for behavioral problems and dysphoric mood. The patient is not nervous/anxious.        Objective:   Physical Exam  Constitutional: He is oriented to person, place, and time. He appears well-developed.  HENT:  Head: Normocephalic.  Right Ear: External ear normal.  Left Ear: External  ear normal.  Eyes: Conjunctivae and EOM are normal.  Neck: Normal range of motion.  Cardiovascular: Normal rate and normal heart sounds.   Pulmonary/Chest: Breath sounds normal. No respiratory distress. He has no wheezes. He has no rales.  Abdominal: Bowel sounds are normal.  Musculoskeletal: Normal range of motion. He exhibits no edema or tenderness.  Neurological: He is alert and oriented to person, place, and time.  Psychiatric: He has a normal mood and affect. His behavior is normal.          Assessment & Plan:   Atypical chest pain/family history premature cardiac disease.  Will set up for stress test for risk stratification   Impaired Glucose tolerance.  We'll check a hemoglobin A1c   (5.4)   Essential hypertension.  Well-controlled   Dustin [name]

## 2016-08-06 NOTE — Patient Instructions (Addendum)
Limit your sodium (Salt) intake  Please check your blood pressure on a regular basis.  If it is consistently greater than 150/90, please make an office appointment.  Return in 6 months for follow-up   Cardiac stress test as discussed

## 2016-08-24 ENCOUNTER — Telehealth (HOSPITAL_COMMUNITY): Payer: Self-pay | Admitting: *Deleted

## 2016-08-24 NOTE — Telephone Encounter (Signed)
Left message on voicemail per DPR in reference to upcoming appointment scheduled on 08/26/16 with detailed instructions given per Myocardial Perfusion Study Information Sheet for the test. LM to arrive 15 minutes early, and that it is imperative to arrive on time for appointment to keep from having the test rescheduled. If you need to cancel or reschedule your appointment, please call the office within 24 hours of your appointment. Failure to do so may result in a cancellation of your appointment, and a $50 no show fee. Phone number given for call back for any questions.   [place] Spainhower Jacqueline    

## 2016-08-26 ENCOUNTER — Ambulatory Visit (HOSPITAL_COMMUNITY): Payer: BLUE CROSS/BLUE SHIELD | Attending: Cardiovascular Disease

## 2016-08-26 DIAGNOSIS — R0789 Other chest pain: Secondary | ICD-10-CM | POA: Diagnosis not present

## 2016-08-26 DIAGNOSIS — Z8249 Family history of ischemic heart disease and other diseases of the circulatory system: Secondary | ICD-10-CM | POA: Insufficient documentation

## 2016-08-26 DIAGNOSIS — I10 Essential (primary) hypertension: Secondary | ICD-10-CM | POA: Insufficient documentation

## 2016-08-26 LAB — MYOCARDIAL PERFUSION IMAGING
CHL CUP NUCLEAR SRS: 2
CHL CUP NUCLEAR SSS: 4
CSEPED: 7 min
CSEPEDS: 50 s
CSEPEW: 9.8 METS
CSEPHR: 106 %
CSEPPHR: 173 {beats}/min
LV dias vol: 83 mL (ref 62–150)
LV sys vol: 32 mL
MPHR: 163 {beats}/min
NUC STRESS TID: 0.85
RATE: 0.28
Rest HR: 72 {beats}/min
SDS: 2

## 2016-08-26 MED ORDER — TECHNETIUM TC 99M TETROFOSMIN IV KIT
10.8000 | PACK | Freq: Once | INTRAVENOUS | Status: AC | PRN
  Administered 2016-08-26: 10.8 via INTRAVENOUS
  Filled 2016-08-26: qty 11

## 2016-08-26 MED ORDER — TECHNETIUM TC 99M TETROFOSMIN IV KIT
32.5000 | PACK | Freq: Once | INTRAVENOUS | Status: AC | PRN
  Administered 2016-08-26: 32.5 via INTRAVENOUS
  Filled 2016-08-26: qty 33

## 2016-12-27 ENCOUNTER — Other Ambulatory Visit: Payer: Self-pay | Admitting: Internal Medicine

## 2017-08-01 ENCOUNTER — Ambulatory Visit (INDEPENDENT_AMBULATORY_CARE_PROVIDER_SITE_OTHER): Payer: BLUE CROSS/BLUE SHIELD | Admitting: Internal Medicine

## 2017-08-01 ENCOUNTER — Encounter: Payer: Self-pay | Admitting: Internal Medicine

## 2017-08-01 VITALS — BP 142/82 | HR 94 | Temp 97.8°F | Wt 199.2 lb

## 2017-08-01 DIAGNOSIS — M542 Cervicalgia: Secondary | ICD-10-CM | POA: Diagnosis not present

## 2017-08-01 DIAGNOSIS — I10 Essential (primary) hypertension: Secondary | ICD-10-CM

## 2017-08-01 NOTE — Progress Notes (Signed)
Subjective:    Patient ID: Dustin [name], male    DOB: [birth year], 56 y.o.   MRN: [id number]  HPI 56 year old patient who presents with a 6-7 month history of increasing neck pain.  Pain is described in the posterior neck and upper shoulder area bilaterally and fairly symmetrically.  Pain is aggravated by movement, especially head turning to the left and right.  No radicular symptoms He did have an abdominal CT scan performed about 2 years ago that did reveal some spinal arthritis  Past Medical History:  Diagnosis Date  . Hypertension   . 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 Marital status: Married    Spouse name: N/A  . Number of children: N/A  . Years of education: N/A   Occupational History  . Not on file.   Social History Main Topics  . Smoking status: Never Smoker  . Smokeless tobacco: Never Used  . Alcohol use No  . Drug use: No  . Sexual activity: Yes   Other Topics Concern  . Not on file   Social History Narrative  . No narrative on file    Past Surgical History:  Procedure Laterality Date  . CHOLECYSTECTOMY  2003  . UMBILICAL HERNIA REPAIR  2003    Family History  Problem Relation Age of Onset  . Hypertension Mother   . Cancer Sister        melanoma  . Heart disease Sister        heart attack 11/2010 - stents placed    Allergies  Allergen Reactions  . Other Swelling    Reaction to novocaine at 56 years old (dentist's office)    Current Outpatient Prescriptions on File Prior to Visit  Medication Sig Dispense Refill  . acetaminophen (TYLENOL) 500 MG tablet Take 1,000 mg by mouth every 6 (six) hours as needed for headache (pain).    Marland Kitchen aspirin EC 81 MG tablet Take 81 mg by mouth daily as needed (chest pain).    Marland Kitchen lisinopril-hydrochlorothiazide (PRINZIDE,ZESTORETIC) 20-12.5 MG tablet TAKE 1 TABLET BY MOUTH DAILY. 90 tablet 3  . Multiple Vitamin (MULTIVITAMIN WITH MINERALS) TABS tablet Take 1 tablet by mouth daily.      No current facility-administered medications on file prior to visit.     BP (!) 142/82 (BP Location: Right Arm, Patient Position: Sitting, Cuff Size: Normal)   Pulse 94   Temp 97.8 F (36.6 C) (Oral)   Wt 199 lb 3.2 oz (90.4 kg)   BMI 26.65 kg/m      Review of Systems  Constitutional: Negative for appetite change, chills, fatigue and fever.  HENT: Negative for congestion, dental problem, ear pain, hearing loss, sore throat, tinnitus, trouble swallowing and voice change.   Eyes: Negative for pain, discharge and visual disturbance.  Respiratory: Negative for cough, chest tightness, wheezing and stridor.   Cardiovascular: Negative for chest pain, palpitations and leg swelling.  Gastrointestinal: Negative for abdominal distention, abdominal pain, blood in stool, constipation, diarrhea, nausea and vomiting.  Genitourinary: Negative for difficulty urinating, discharge, flank pain, genital sores, hematuria and urgency.  Musculoskeletal: Positive for neck pain and neck stiffness. Negative for arthralgias, back pain, gait problem, joint swelling and myalgias.  Skin: Negative for rash.  Neurological: Negative for dizziness, syncope, speech difficulty, weakness, numbness and headaches.  Hematological: Negative for adenopathy. Does not bruise/bleed easily.  Psychiatric/Behavioral: Negative for behavioral problems and dysphoric mood. The patient is not nervous/anxious.  Objective:   Physical Exam  Constitutional: He appears well-developed and well-nourished. No distress.  Neck:  Range of motion.  Slightly stiff and limited Upper back and posterior neck musculature, slightly tense          Assessment & Plan:   Neck pain.  Seems more musculoligamentous.  Will treat with gentle massage and heat therapy.  He was given information concerning stretching/ rehabilitation exercises.  He will use ibuprofen as needed.  If unimproved, will consider radiographs and referral for physical  therapy  Rogelia Boga

## 2017-08-01 NOTE — Patient Instructions (Addendum)
Cervical Strain and Sprain Rehab Ask your health care provider which exercises are safe for you. Do exercises exactly as told by your health care provider and adjust them as directed. It is normal to feel mild stretching, pulling, tightness, or discomfort as you do these exercises, but you should stop right away if you feel sudden pain or your pain gets worse.Do not begin these exercises until told by your health care provider. Stretching and range of motion exercises These exercises warm up your muscles and joints and improve the movement and flexibility of your neck. These exercises also help to relieve pain, numbness, and tingling. Exercise A: Cervical side bend  1. Using good posture, sit on a stable chair or stand up. 2. Without moving your shoulders, slowly tilt your left / right ear to your shoulder until you feel a stretch in your neck muscles. You should be looking straight ahead. 3. Hold for __________ seconds. 4. Repeat with the other side of your neck. Repeat __________ times. Complete this exercise __________ times a day. Exercise B: Cervical rotation  1. Using good posture, sit on a stable chair or stand up. 2. Slowly turn your head to the side as if you are looking over your left / right shoulder. ? Keep your eyes level with the ground. ? Stop when you feel a stretch along the side and the back of your neck. 3. Hold for __________ seconds. 4. Repeat this by turning to your other side. Repeat __________ times. Complete this exercise __________ times a day. Exercise C: Thoracic extension and pectoral stretch 1. Roll a towel or a small blanket so it is about 4 inches (10 cm) in diameter. 2. Lie down on your back on a firm surface. 3. Put the towel lengthwise, under your spine in the middle of your back. It should not be not under your shoulder blades. The towel should line up with your spine from your middle back to your lower back. 4. Put your hands behind your head and let your  elbows fall out to your sides. 5. Hold for __________ seconds. Repeat __________ times. Complete this exercise __________ times a day. Strengthening exercises These exercises build strength and endurance in your neck. Endurance is the ability to use your muscles for a long time, even after your muscles get tired. Exercise D: Upper cervical flexion, isometric 1. Lie on your back with a thin pillow behind your head and a small rolled-up towel under your neck. 2. Gently tuck your chin toward your chest and nod your head down to look toward your feet. Do not lift your head off the pillow. 3. Hold for __________ seconds. 4. Release the tension slowly. Relax your neck muscles completely before you repeat this exercise. Repeat __________ times. Complete this exercise __________ times a day. Exercise E: Cervical extension, isometric  1. Stand about 6 inches (15 cm) away from a wall, with your back facing the wall. 2. Place a soft object, about 6-8 inches (15-20 cm) in diameter, between the back of your head and the wall. A soft object could be a small pillow, a ball, or a folded towel. 3. Gently tilt your head back and press into the soft object. Keep your jaw and forehead relaxed. 4. Hold for __________ seconds. 5. Release the tension slowly. Relax your neck muscles completely before you repeat this exercise. Repeat __________ times. Complete this exercise __________ times a day. Posture and body mechanics  Body mechanics refers to the movements and positions of   your body while you do your daily activities. Posture is part of body mechanics. Good posture and healthy body mechanics can help to relieve stress in your body's tissues and joints. Good posture means that your spine is in its natural S-curve position (your spine is neutral), your shoulders are pulled back slightly, and your head is not tipped forward. The following are general guidelines for applying improved posture and body mechanics to  your everyday activities. Standing  When standing, keep your spine neutral and keep your feet about hip-width apart. Keep a slight bend in your knees. Your ears, shoulders, and hips should line up.  When you do a task in which you stand in one place for a long time, place one foot up on a stable object that is 2-4 inches (5-10 cm) high, such as a footstool. This helps keep your spine neutral. Sitting   When sitting, keep your spine neutral and your keep feet flat on the floor. Use a footrest, if necessary, and keep your thighs parallel to the floor. Avoid rounding your shoulders, and avoid tilting your head forward.  When working at a desk or a computer, keep your desk at a height where your hands are slightly lower than your elbows. Slide your chair under your desk so you are close enough to maintain good posture.  When working at a computer, place your monitor at a height where you are looking straight ahead and you do not have to tilt your head forward or downward to look at the screen. Resting When lying down and resting, avoid positions that are most painful for you. Try to support your neck in a neutral position. You can use a contour pillow or a small rolled-up towel. Your pillow should support your neck but not push on it. Call or return to clinic prn if these symptoms worsen or fail to improve as anticipated.

## 2017-10-11 ENCOUNTER — Encounter: Payer: Self-pay | Admitting: Internal Medicine

## 2017-10-11 ENCOUNTER — Ambulatory Visit (INDEPENDENT_AMBULATORY_CARE_PROVIDER_SITE_OTHER): Payer: BLUE CROSS/BLUE SHIELD | Admitting: Internal Medicine

## 2017-10-11 VITALS — BP 128/64 | HR 74 | Temp 97.7°F | Ht 72.0 in | Wt 193.6 lb

## 2017-10-11 DIAGNOSIS — Z0000 Encounter for general adult medical examination without abnormal findings: Secondary | ICD-10-CM

## 2017-10-11 DIAGNOSIS — Z125 Encounter for screening for malignant neoplasm of prostate: Secondary | ICD-10-CM

## 2017-10-11 LAB — PSA: PSA: 0.48 ng/mL (ref 0.10–4.00)

## 2017-10-11 LAB — CBC WITH DIFFERENTIAL/PLATELET
BASOS PCT: 0.5 % (ref 0.0–3.0)
Basophils Absolute: 0 10*3/uL (ref 0.0–0.1)
EOS PCT: 0.8 % (ref 0.0–5.0)
Eosinophils Absolute: 0.1 10*3/uL (ref 0.0–0.7)
HCT: 44 % (ref 39.0–52.0)
HEMOGLOBIN: 15.2 g/dL (ref 13.0–17.0)
Lymphocytes Relative: 22.9 % (ref 12.0–46.0)
Lymphs Abs: 1.7 10*3/uL (ref 0.7–4.0)
MCHC: 34.6 g/dL (ref 30.0–36.0)
MCV: 90.5 fl (ref 78.0–100.0)
MONOS PCT: 6.6 % (ref 3.0–12.0)
Monocytes Absolute: 0.5 10*3/uL (ref 0.1–1.0)
Neutro Abs: 5.2 10*3/uL (ref 1.4–7.7)
Neutrophils Relative %: 69.2 % (ref 43.0–77.0)
Platelets: 321 10*3/uL (ref 150.0–400.0)
RBC: 4.86 Mil/uL (ref 4.22–5.81)
RDW: 13.2 % (ref 11.5–15.5)
WBC: 7.5 10*3/uL (ref 4.0–10.5)

## 2017-10-11 LAB — COMPREHENSIVE METABOLIC PANEL
ALBUMIN: 4.7 g/dL (ref 3.5–5.2)
ALT: 17 U/L (ref 0–53)
AST: 17 U/L (ref 0–37)
Alkaline Phosphatase: 41 U/L (ref 39–117)
BUN: 19 mg/dL (ref 6–23)
CALCIUM: 10 mg/dL (ref 8.4–10.5)
CHLORIDE: 101 meq/L (ref 96–112)
CO2: 30 meq/L (ref 19–32)
Creatinine, Ser: 1.14 mg/dL (ref 0.40–1.50)
GFR: 70.07 mL/min (ref >60.00)
Glucose, Bld: 116 mg/dL — ABNORMAL HIGH (ref 70–99)
Potassium: 4.3 mEq/L (ref 3.5–5.1)
Sodium: 138 mEq/L (ref 135–145)
Total Bilirubin: 0.8 mg/dL (ref 0.2–1.2)
Total Protein: 7.6 g/dL (ref 6.0–8.3)

## 2017-10-11 LAB — LIPID PANEL
CHOL/HDL RATIO: 4
Cholesterol: 200 mg/dL (ref 0–200)
HDL: 51.2 mg/dL (ref >39.00)
LDL CALC: 121 mg/dL — AB (ref 0–99)
NonHDL: 148.36
TRIGLYCERIDES: 136 mg/dL (ref 0.0–149.0)
VLDL: 27.2 mg/dL (ref 0.0–40.0)

## 2017-10-11 LAB — TSH: TSH: 1.46 u[IU]/mL (ref 0.35–4.50)

## 2017-10-11 MED ORDER — LISINOPRIL-HYDROCHLOROTHIAZIDE 20-12.5 MG PO TABS: 1.0000 | ORAL_TABLET | Freq: Every day | ORAL | 3 refills | Status: DC

## 2017-10-11 NOTE — Progress Notes (Signed)
Subjective:    Patient ID: Dustin [name], male    DOB: [birth year], 56 y.o.   MRN: [id number]  HPI 56 year old patient seen today for an annual exam. Medical problems include essential hypertension.  Patient did have a nuclear stress test performed October 2017 which was normal.  He has a history of renal colic in March 2016  Family history mother had cerebrovascular disease.  Father status post CABG younger sister status post stent  No screening colonoscopy ;this has been scheduled for October 28, 2017  Family history.  Mother age 58 with a history of cerebrovascular disease.  Presently resides in a nursing home.  Father died at 4474 of complications from coronary artery disease.  He was status post CABG in his late 1550s.  3 sisters one with a history of melanoma.  One with coronary artery disease status post stent.  One brother died young of congenital issues at age 58  Past Medical History:  Diagnosis Date  . Hypertension   . 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 Marital status: Married    Spouse name: Not on file  . Number of children: Not on file  . Years of education: Not on file  . Highest education level: Not on file  Social Needs  . Financial resource strain: Not on file  . Food insecurity - worry: Not on file  . Food insecurity - inability: Not on file  . Transportation needs - medical: Not on file  . Transportation needs - non-medical: Not on file  Occupational History  . Not on file  Tobacco Use  . Smoking status: Never Smoker  . Smokeless tobacco: Never Used  Substance and Sexual Activity  . Alcohol use: No  . Drug use: No  . Sexual activity: Yes  Other Topics Concern  . Not on file  Social History Narrative  . Not on file    Past Surgical History:  Procedure Laterality Date  . CHOLECYSTECTOMY  2003  . UMBILICAL HERNIA REPAIR  2003    Family History  Problem Relation Age of Onset  . Hypertension Mother   .  Cancer Sister        melanoma  . Heart disease Sister        heart attack 11/2010 - stents placed    Allergies  Allergen Reactions  . Other Swelling    Reaction to novocaine at 56 years old (dentist's office)    Current Outpatient Medications on File Prior to Visit  Medication Sig Dispense Refill  . lisinopril-hydrochlorothiazide (PRINZIDE,ZESTORETIC) 20-12.5 MG tablet TAKE 1 TABLET BY MOUTH DAILY. 90 tablet 3  . Multiple Vitamin (MULTIVITAMIN WITH MINERALS) TABS tablet Take 1 tablet by mouth daily.    Marland Kitchen. acetaminophen (TYLENOL) 500 MG tablet Take 1,000 mg by mouth every 6 (six) hours as needed for headache (pain).    Marland Kitchen. aspirin EC 81 MG tablet Take 81 mg by mouth daily as needed (chest pain).     No current facility-administered medications on file prior to visit.     BP 128/64 (BP Location: Left Arm, Patient Position: Sitting, Cuff Size: Normal)   Pulse 74   Temp 97.7 F (36.5 C) (Oral)   Ht 6' (1.829 m)   Wt 193 lb 9.6 oz (87.8 kg)   SpO2 98%   BMI 26.26 kg/m     Review of Systems  Constitutional: Negative for appetite change, chills, fatigue and fever.  HENT: Negative for congestion,  dental problem, ear pain, hearing loss, sore throat, tinnitus, trouble swallowing and voice change.   Eyes: Negative for pain, discharge and visual disturbance.  Respiratory: Negative for cough, chest tightness, wheezing and stridor.   Cardiovascular: Negative for chest pain, palpitations and leg swelling.  Gastrointestinal: Negative for abdominal distention, abdominal pain, blood in stool, constipation, diarrhea, nausea and vomiting.  Genitourinary: Negative for difficulty urinating, discharge, flank pain, genital sores, hematuria and urgency.  Musculoskeletal: Negative for arthralgias, back pain, gait problem, joint swelling, myalgias and neck stiffness.  Skin: Negative for rash.  Neurological: Negative for dizziness, syncope, speech difficulty, weakness, numbness and headaches.    Hematological: Negative for adenopathy. Does not bruise/bleed easily.  Psychiatric/Behavioral: Negative for behavioral problems and dysphoric mood. The patient is not nervous/anxious.        Objective:   Physical Exam  Constitutional: He appears well-developed and well-nourished.  HENT:  Head: Normocephalic and atraumatic.  Right Ear: External ear normal.  Left Ear: External ear normal.  Nose: Nose normal.  Mouth/Throat: Oropharynx is clear and moist.  Eyes: Conjunctivae and EOM are normal. Pupils are equal, round, and reactive to light. No scleral icterus.  Neck: Normal range of motion. Neck supple. No JVD present. No thyromegaly present.  Cardiovascular: Regular rhythm, normal heart sounds and intact distal pulses. Exam reveals no gallop and no friction rub.  No murmur heard. Decreased right dorsalis pedis pulse  Pulmonary/Chest: Effort normal and breath sounds normal. He exhibits no tenderness.  Abdominal: Soft. Bowel sounds are normal. He exhibits no distension and no mass. There is no tenderness.  Genitourinary: Penis normal.  Musculoskeletal: Normal range of motion. He exhibits no edema or tenderness.  Lymphadenopathy:    He has no cervical adenopathy.  Neurological: He is alert. He has normal reflexes. No cranial nerve deficit. Coordination normal.  Skin: Skin is warm and dry. No rash noted.  Psychiatric: He has a normal mood and affect. His behavior is normal.          Assessment & Plan:   Preventive health examination.  Screening colonoscopy as scheduled.  Patient has had a flu vaccine this year.  Will check screening lab Essential hypertension well-controlled.  Continue low-salt diet.  Patient plans on starting an exercise regimen with his son continue home blood pressure monitoring.  Follow-up 1 year  Rogelia BogaKWIATKOWSKI,PETER FRANK

## 2017-10-11 NOTE — Patient Instructions (Addendum)
Limit your sodium (Salt) intake  Please check your blood pressure on a regular basis.  If it is consistently greater than 150/90, please make an office appointment.  Schedule your colonoscopy to help detect colon cancer.     It is important that you exercise regularly, at least 20 minutes 3 to 4 times per week.  If you develop chest pain or shortness of breath seek  medical attention.  Return in one year for follow-up

## 2017-10-12 ENCOUNTER — Telehealth: Payer: Self-pay | Admitting: Internal Medicine

## 2017-10-12 LAB — HEPATITIS C ANTIBODY
Hepatitis C Ab: NONREACTIVE
SIGNAL TO CUT-OFF: 0.01 (ref <1.00)

## 2017-10-12 NOTE — Telephone Encounter (Signed)
Copied from CRM [id number]. Topic: Inquiry >> Oct 12, 2017  4:07 PM Raquel SarnaHayes, Teresa G wrote: Pt is asking about lab results and how long it would take to get them back.  He was told they should be on his MyChart and they were not there when he checked.

## 2017-10-14 ENCOUNTER — Ambulatory Visit (AMBULATORY_SURGERY_CENTER): Payer: Self-pay | Admitting: *Deleted

## 2017-10-14 ENCOUNTER — Other Ambulatory Visit: Payer: Self-pay

## 2017-10-14 VITALS — Ht 73.0 in | Wt 193.0 lb

## 2017-10-14 DIAGNOSIS — Z1210 Encounter for screening for malignant neoplasm of intestinal tract, unspecified: Secondary | ICD-10-CM

## 2017-10-14 MED ORDER — PEG-KCL-NACL-NASULF-NA ASC-C 140 G PO SOLR: 1.0000 | ORAL | 0 refills | Status: DC

## 2017-10-14 NOTE — Progress Notes (Signed)
No egg or soy allergy known to patient  No issues with past sedation with any surgeries  or procedures, no intubation problems  No diet pills per patient No home 02 use per patient  No blood thinners per patient  Pt denies issues with constipation  No A fib or A flutter  EMMI video sent to pt's e mail  plenvu coupon to pt today in [place]

## 2017-10-17 NOTE — Telephone Encounter (Signed)
Patient reviewed results on MyChart today.

## 2017-10-28 ENCOUNTER — Encounter: Payer: Self-pay | Admitting: Gastroenterology

## 2017-10-28 ENCOUNTER — Other Ambulatory Visit: Payer: Self-pay

## 2017-10-28 ENCOUNTER — Ambulatory Visit (AMBULATORY_SURGERY_CENTER): Payer: BLUE CROSS/BLUE SHIELD | Admitting: Gastroenterology

## 2017-10-28 VITALS — BP 101/71 | HR 76 | Temp 96.6°F | Resp 12 | Ht 72.0 in | Wt 193.0 lb

## 2017-10-28 DIAGNOSIS — Z1212 Encounter for screening for malignant neoplasm of rectum: Secondary | ICD-10-CM

## 2017-10-28 DIAGNOSIS — Z1211 Encounter for screening for malignant neoplasm of colon: Secondary | ICD-10-CM | POA: Diagnosis not present

## 2017-10-28 MED ORDER — SODIUM CHLORIDE 0.9 % IV SOLN: 500.0000 mL | Freq: Once | INTRAVENOUS | Status: DC

## 2017-10-28 NOTE — Progress Notes (Signed)
A and O x3. Report to RN. Tolerated MAC anesthesia well.

## 2017-10-28 NOTE — Patient Instructions (Signed)
YOU HAD AN ENDOSCOPIC PROCEDURE TODAY AT THE [facility]:   Refer to the procedure report that was given to you for any specific questions about what was found during the examination.  If the procedure report does not answer your questions, please call your gastroenterologist to clarify.  If you requested that your care partner not be given the details of your procedure findings, then the procedure report has been included in a sealed envelope for you to review at your convenience later.  YOU SHOULD EXPECT: Some feelings of bloating in the abdomen. Passage of more gas than usual.  Walking can help get rid of the air that was put into your GI tract during the procedure and reduce the bloating. If you had a lower endoscopy (such as a colonoscopy or flexible sigmoidoscopy) you may notice spotting of blood in your stool or on the toilet paper. If you underwent a bowel prep for your procedure, you may not have a normal bowel movement for a few days.  Please Note:  You might notice some irritation and congestion in your nose or some drainage.  This is from the oxygen used during your procedure.  There is no need for concern and it should clear up in a day or so.  SYMPTOMS TO REPORT IMMEDIATELY:   Following lower endoscopy (colonoscopy or flexible sigmoidoscopy):  Excessive amounts of blood in the stool  Significant tenderness or worsening of abdominal pains  Swelling of the abdomen that is new, acute  Fever of 100F or higher  For urgent or emergent issues, a gastroenterologist can be reached at any hour by calling [phone].   DIET:  We do recommend a small meal at first, but then you may proceed to your regular diet.  Drink plenty of fluids but you should avoid alcoholic beverages for 24 hours.  MEDICATIONS: Continue present medications.  Please see handouts given to you by your recovery nurse.  ACTIVITY:  You should plan to take it easy for the rest of today and you should NOT  DRIVE or use heavy machinery until tomorrow (because of the sedation medicines used during the test).    FOLLOW UP: Our staff will call the number listed on your records the next business day following your procedure to check on you and address any questions or concerns that you may have regarding the information given to you following your procedure. If we do not reach you, we will leave a message.  However, if you are feeling well and you are not experiencing any problems, there is no need to return our call.  We will assume that you have returned to your regular daily activities without incident.  If any biopsies were taken you will be contacted by phone or by letter within the next 1-3 weeks.  Please call us at [phone] if you have not heard about the biopsies in 3 weeks.   Thank you for allowing us to provide for your healthcare needs today.   SIGNATURES/CONFIDENTIALITY: You and/or your care partner have signed paperwork which will be entered into your electronic medical record.  These signatures attest to the fact that that the information above on your After Visit Summary has been reviewed and is understood.  Full responsibility of the confidentiality of this discharge information lies with you and/or your care-partner. 

## 2017-10-28 NOTE — Op Note (Signed)
[facility] Patient Name: Dustin PeaksBrian Salmi Procedure Date: 10/28/2017 10:07 AM MRN: [id number] Endoscopist: Viviann SpareSteven P. Armbruster MD, MD Age: [age] Referring MD:  Date of Birth: 12/17/1960 Gender: Male Account #: [id number] Procedure:                Colonoscopy Indications:              Screening for colorectal malignant neoplasm, This                            is the patient's first colonoscopy Medicines:                Monitored Anesthesia Care Procedure:                Pre-Anesthesia Assessment:                           - Prior to the procedure, a History and Physical                            was performed, and patient medications and                            allergies were reviewed. The patient's tolerance of                            previous anesthesia was also reviewed. The risks                            and benefits of the procedure and the sedation                            options and risks were discussed with the patient.                            All questions were answered, and informed consent                            was obtained. Prior Anticoagulants: The patient has                            taken no previous anticoagulant or antiplatelet                            agents. ASA Grade Assessment: II - A patient with                            mild systemic disease. After reviewing the risks                            and benefits, the patient was deemed in                            satisfactory condition to undergo the procedure.  After obtaining informed consent, the colonoscope                            was passed under direct vision. Throughout the                            procedure, the patient's blood pressure, pulse, and                            oxygen saturations were monitored continuously. The                            Colonoscope was introduced through the anus and                            advanced to the the  cecum, identified by                            appendiceal orifice and ileocecal valve. The                            colonoscopy was performed without difficulty. The                            patient tolerated the procedure well. The quality                            of the bowel preparation was good. The ileocecal                            valve, appendiceal orifice, and rectum were                            photographed. Scope In: 10:16:59 AM Scope Out: 10:33:08 AM Scope Withdrawal Time: 0 hours 13 minutes 12 seconds  Total Procedure Duration: 0 hours 16 minutes 9 seconds  Findings:                 The perianal and digital rectal examinations were                            normal.                           A single medium-mouthed diverticulum was found in                            the transverse colon.                           Internal hemorrhoids were found during                            retroflexion. The hemorrhoids were small.  The exam was otherwise without abnormality. Complications:            No immediate complications. Estimated blood loss:                            None. Estimated Blood Loss:     Estimated blood loss: none. Impression:               - Diverticulosis in the transverse colon.                           - Internal hemorrhoids.                           - The examination was otherwise normal.                           - No polyps Recommendation:           - Patient has a contact number available for                            emergencies. The signs and symptoms of potential                            delayed complications were discussed with the                            patient. Return to normal activities tomorrow.                            Written discharge instructions were provided to the                            patient.                           - Resume previous diet.                           - Continue  present medications.                           - Repeat colonoscopy in 10 years for screening                            purposes. Viviann Spare P. Armbruster MD, MD 10/28/2017 10:36:37 AM This report has been signed electronically.

## 2017-10-28 NOTE — Progress Notes (Signed)
Patient history reviewed and no changes noted since pre visit.

## 2017-10-31 ENCOUNTER — Telehealth: Payer: Self-pay

## 2017-10-31 NOTE — Telephone Encounter (Signed)
  Follow up Call-  Call back number 10/28/2017  Post procedure Call Back phone  # [phone]  Permission to leave phone message Yes  Some recent data might be hidden     Patient questions:  Do you have a fever, pain , or abdominal swelling? No. Pain Score  0 *  Have you tolerated food without any problems? Yes.    Have you been able to return to your normal activities? Yes.    Do you have any questions about your discharge instructions: Diet   No. Medications  No. Follow up visit  No.  Do you have questions or concerns about your Care? No.  Actions: * If pain score is 4 or above: No action needed, pain <4.

## 2017-11-06 ENCOUNTER — Encounter: Payer: Self-pay | Admitting: Internal Medicine

## 2017-11-15 DIAGNOSIS — H9313 Tinnitus, bilateral: Secondary | ICD-10-CM | POA: Diagnosis not present

## 2017-11-15 DIAGNOSIS — H903 Sensorineural hearing loss, bilateral: Secondary | ICD-10-CM | POA: Insufficient documentation

## 2017-12-12 DIAGNOSIS — J029 Acute pharyngitis, unspecified: Secondary | ICD-10-CM | POA: Diagnosis not present

## 2017-12-12 DIAGNOSIS — J111 Influenza due to unidentified influenza virus with other respiratory manifestations: Secondary | ICD-10-CM | POA: Diagnosis not present

## 2017-12-12 DIAGNOSIS — J329 Chronic sinusitis, unspecified: Secondary | ICD-10-CM | POA: Insufficient documentation

## 2018-01-05 ENCOUNTER — Emergency Department (HOSPITAL_COMMUNITY)
Admission: EM | Admit: 2018-01-05 | Discharge: 2018-01-05 | Disposition: A | Discharge status: Home / Self Care | Payer: BLUE CROSS/BLUE SHIELD | Attending: Emergency Medicine | Admitting: Emergency Medicine

## 2018-01-05 ENCOUNTER — Other Ambulatory Visit: Payer: Self-pay

## 2018-01-05 ENCOUNTER — Encounter (HOSPITAL_COMMUNITY): Payer: Self-pay | Admitting: Emergency Medicine

## 2018-01-05 ENCOUNTER — Emergency Department (HOSPITAL_COMMUNITY): Payer: BLUE CROSS/BLUE SHIELD

## 2018-01-05 DIAGNOSIS — R0789 Other chest pain: Secondary | ICD-10-CM | POA: Diagnosis not present

## 2018-01-05 DIAGNOSIS — Z5321 Procedure and treatment not carried out due to patient leaving prior to being seen by health care provider: Secondary | ICD-10-CM | POA: Diagnosis not present

## 2018-01-05 DIAGNOSIS — R0602 Shortness of breath: Secondary | ICD-10-CM | POA: Insufficient documentation

## 2018-01-05 LAB — CBC
HEMATOCRIT: 43.1 % (ref 39.0–52.0)
Hemoglobin: 15.1 g/dL (ref 13.0–17.0)
MCH: 31.3 pg (ref 26.0–34.0)
MCHC: 35 g/dL (ref 30.0–36.0)
MCV: 89.2 fL (ref 78.0–100.0)
PLATELETS: 319 10*3/uL (ref 150–400)
RBC: 4.83 MIL/uL (ref 4.22–5.81)
RDW: 12.9 % (ref 11.5–15.5)
WBC: 10.9 10*3/uL — ABNORMAL HIGH (ref 4.0–10.5)

## 2018-01-05 LAB — BASIC METABOLIC PANEL
Anion gap: 12 (ref 5–15)
BUN: 16 mg/dL (ref 6–20)
CHLORIDE: 101 mmol/L (ref 101–111)
CO2: 24 mmol/L (ref 22–32)
CREATININE: 1.2 mg/dL (ref 0.61–1.24)
Calcium: 10 mg/dL (ref 8.9–10.3)
GFR calc Af Amer: >60 mL/min (ref >60)
GFR calc non Af Amer: >60 mL/min (ref >60)
GLUCOSE: 132 mg/dL — AB (ref 65–99)
POTASSIUM: 3.9 mmol/L (ref 3.5–5.1)
SODIUM: 137 mmol/L (ref 135–145)

## 2018-01-05 LAB — I-STAT TROPONIN, ED: Troponin i, poc: 0 ng/mL (ref 0.00–0.08)

## 2018-01-05 NOTE — ED Notes (Signed)
Family and patient leaving at this time, LWBS.

## 2018-01-05 NOTE — ED Triage Notes (Signed)
Pt c/o intermittent chest discomfort and shortness of breath that started at 0645 this am. Hx HTN, took BP meds PTA. No cardiac hx.

## 2018-01-06 NOTE — ED Notes (Signed)
01/06/2018,  Attempted to follow-up call, no answer.

## 2018-01-10 ENCOUNTER — Ambulatory Visit: Payer: Self-pay | Admitting: *Deleted

## 2018-01-10 NOTE — Telephone Encounter (Signed)
Pt  Was  Treated   For  The  [facility]  [facility]  approx  1  Month  Ago   Took  Tamiflu  At that  Time.  At this  Time  He  Is  Not in any  Severe  Distress  Speaking  In  Complete  Sentances. Denies   Any   Neurological   Symptoms   Pt  States  He  Was  At the  Er   6  Days  Ago   And  Had   ekg  And  cxr  Which  He  Stated  Was  Normal  But he  Left  Before  Disposition .   Dr Lesia HausenKwiatowski  Not  Available   appt  Made  tommorow  With  Dr  Salomon FickBanks   Reason for Disposition . [1] Sinus congestion (pressure, fullness) AND [2] present > 10 days  Answer Assessment - Initial Assessment Questions 1. LOCATION: "Where does it hurt?"       Pressure   Sinus   Around  Bridge   Of  Nurse   2. ONSET: "When did the sinus pain start?"  (e.g., hours, days)         Started approx   1  Month   Ago  Come  And   Goes   3. SEVERITY: "How bad is the pain?"   (Scale 1-10; mild, moderate or severe)   - MILD (1-3): doesn't interfere with normal activities    - MODERATE (4-7): interferes with normal activities (e.g., work or school) or awakens from sleep   - SEVERE (8-10): excruciating pain and patient unable to do any normal activities         Mild  2    4. RECURRENT SYMPTOM: "Have you ever had sinus problems before?" If so, ask: "When was the last time?" and "What happened that time?"       NO     5. NASAL CONGESTION: "Is the nose blocked?" If so, ask, "Can you open it or must you breathe through the mouth?"     Not nose  iot  Is  Higher up   6. NASAL DISCHARGE: "Do you have discharge from your nose?" If so ask, "What color?"    no 7. FEVER: "Do you have a fever?" If so, ask: "What is it, how was it measured, and when did it start?"     no 8. OTHER SYMPTOMS: "Do you have any other symptoms?" (e.g., sore throat, cough, earache, difficulty breathing)   Fatigued    Light  Chest   Pain  When  He   Coughs   Feels   Like  Some  Blurred  Vision  X   3   Weeks   Pt   Is  Awake  And  Alert  No neuro  Deficit   Also  Has    Pain  r  Knee  approx   6  Weeks ago  No  Known  Injury     9. PREGNANCY: "Is there any chance you are pregnant?" "When was your last menstrual period?"   n/a  Protocols used: SINUS PAIN OR CONGESTION-A-AH

## 2018-01-11 ENCOUNTER — Encounter: Payer: Self-pay | Admitting: Family Medicine

## 2018-01-11 ENCOUNTER — Ambulatory Visit: Payer: BLUE CROSS/BLUE SHIELD | Admitting: Family Medicine

## 2018-01-11 VITALS — BP 120/70 | HR 88 | Temp 97.9°F | Wt 193.2 lb

## 2018-01-11 DIAGNOSIS — J0100 Acute maxillary sinusitis, unspecified: Secondary | ICD-10-CM | POA: Diagnosis not present

## 2018-01-11 DIAGNOSIS — R059 Cough, unspecified: Secondary | ICD-10-CM

## 2018-01-11 DIAGNOSIS — R05 Cough: Secondary | ICD-10-CM | POA: Diagnosis not present

## 2018-01-11 MED ORDER — AMOXICILLIN 500 MG PO CAPS: 500.0000 mg | ORAL_CAPSULE | Freq: Two times a day (BID) | ORAL | 0 refills | Status: AC

## 2018-01-11 MED ORDER — FLUTICASONE PROPIONATE 50 MCG/ACT NA SUSP: 1.0000 | Freq: Every day | NASAL | 0 refills | Status: DC

## 2018-01-11 NOTE — Progress Notes (Signed)
Subjective:    Patient ID: Dustin [name], male    DOB: 1961/09/04, 57 y.o.   MRN: [id number]  Chief Complaint  Patient presents with  . Sinus Problem    patient has had a lot of sinus pressure under eye was also diagnosed witht the flu a month ago and symptoms hasnt inproved  . Cough    small amount of phelm   . Fatigue  . Shortness of Breath    Pressure in chest patient claim he can barely walk a mile without being out of breath   . Sore Throat    Sx started last night    HPI Patient was seen today for ongoing concern.  Patient endorses having the flu the beginning of February.  Since then he has continued to have issues with head heaviness, chest pressure, dry cough, high pressure, feeling tired/weak that comes and goes.  Patient states this latest episode started last Thursday.  Patient has taken NyQuil and Alka-Seltzer cold plus.  Of note patient is also on lisinopril.  Pt's dry cough has been chronic.  Past Medical History:  Diagnosis Date  . GERD (gastroesophageal reflux disease)    occasionally with no meds  . Hypertension   . 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 Other Swelling    Reaction to novocaine at 57 years old (dentist's office)    ROS General: Denies fever, chills, night sweats, changes in weight, changes in appetite +fatigue HEENT: Denies headaches, ear pain, changes in vision, rhinorrhea, sore throat  +head heaviness, eye pressure CV: Denies CP, palpitations, SOB, orthopnea  +chest pressure/difficulty breathing in deep Pulm: Denies SOB, wheezing  +dry cough GI: Denies abdominal pain, nausea, vomiting, diarrhea, constipation GU: Denies dysuria, hematuria, frequency, vaginal discharge Msk: Denies muscle cramps, joint pains Neuro: Denies weakness, numbness, tingling Skin: Denies rashes, bruising Psych: Denies depression, anxiety, hallucinations     Objective:    Blood pressure 120/70, pulse 88, temperature 97.9 F (36.6 C),  temperature source Oral, weight 193 lb 3.2 oz (87.6 kg), SpO2 98 %.   Gen. Pleasant, well-nourished, in no distress, normal affect   HEENT: [place]/AT, face symmetric, conjunctiva clear, no scleral icterus, PERRLA, nares patent with clear drainage, pharynx with mild erythema and postnasal drainagae, no exudate.  TMs normal bilaterally.  No cervical lymphadenopathy. Lungs: no accessory muscle use, CTAB, no wheezes or rales Cardiovascular: RRR, no m/r/g, no peripheral edema Abdomen: BS present, soft, NT/ND, no hepatosplenomegaly. Musculoskeletal: No deformities, no cyanosis or clubbing, normal tone.  No TTP of chest. Neuro:  A&Ox3, CN II-XII intact, normal gait    Wt Readings from Last 3 Encounters:  01/11/18 193 lb 3.2 oz (87.6 kg)  01/05/18 194 lb (88 kg)  10/28/17 193 lb (87.5 kg)    Lab Results  Component Value Date   WBC 10.9 (H) 01/05/2018   HGB 15.1 01/05/2018   HCT 43.1 01/05/2018   PLT 319 01/05/2018   GLUCOSE 132 (H) 01/05/2018   CHOL 200 10/11/2017   TRIG 136.0 10/11/2017   HDL 51.20 10/11/2017   LDLCALC 121 (H) 10/11/2017   ALT 17 10/11/2017   AST 17 10/11/2017   NA 137 01/05/2018   K 3.9 01/05/2018   CL 101 01/05/2018   CREATININE 1.20 01/05/2018   BUN 16 01/05/2018   CO2 24 01/05/2018   TSH 1.46 10/11/2017   PSA 0.48 10/11/2017   HGBA1C 5.4 08/06/2016    Assessment/Plan:  Subacute maxillary sinusitis  -Supportive care. -We  will also send an Rx for amoxicillin and Flonase. - Plan: amoxicillin (AMOXIL) 500 MG capsule, fluticasone (FLONASE) 50 MCG/ACT nasal spray  Cough -Possibly 2/2 lisinopril use -Patient encouraged to consider follow-up appointment to discuss this with PCP.  F/u prn  Abbe AmsterdamShannon Kanda Deluna, MD

## 2018-01-11 NOTE — Patient Instructions (Addendum)

## 2018-02-02 ENCOUNTER — Other Ambulatory Visit: Payer: Self-pay | Admitting: Family Medicine

## 2018-02-02 DIAGNOSIS — J0100 Acute maxillary sinusitis, unspecified: Secondary | ICD-10-CM

## 2018-08-03 ENCOUNTER — Encounter: Payer: Self-pay | Admitting: Family Medicine

## 2018-08-03 ENCOUNTER — Ambulatory Visit: Payer: BLUE CROSS/BLUE SHIELD | Admitting: Family Medicine

## 2018-08-03 VITALS — BP 110/78 | HR 82 | Temp 98.1°F | Wt 188.0 lb

## 2018-08-03 DIAGNOSIS — I10 Essential (primary) hypertension: Secondary | ICD-10-CM

## 2018-08-03 NOTE — Progress Notes (Signed)
Subjective:    Patient ID: Dustin [name], male    DOB: [birth year], 57 y.o.   MRN: [id number]  No chief complaint on file.   HPI Patient was seen today for f/u on chronic issues and TOC, formerly seen by Dr. Amador Cunas.   HTN: -was taking lisinopril-HCTZ 20-12.5 mg daily -noticed fairly well controlled bp, so d/c'd bp meds, then restarted at 1/2 a pill -bp ranges 90-125/57-76 -pt and wife eating fresh veggies (using hello fresh meal prep kits) -denies HAs.   Past Medical History:  Diagnosis Date  . GERD (gastroesophageal reflux disease)    occasionally with no meds  . Hypertension   . 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 Other Swelling    Reaction to novocaine at 57 years old (dentist's office)    ROS General: Denies fever, chills, night sweats, changes in weight, changes in appetite HEENT: Denies headaches, ear pain, changes in vision, rhinorrhea, sore throat CV: Denies CP, palpitations, SOB, orthopnea Pulm: Denies SOB, cough, wheezing GI: Denies abdominal pain, nausea, vomiting, diarrhea, constipation GU: Denies dysuria, hematuria, frequency, vaginal discharge Msk: Denies muscle cramps, joint pains Neuro: Denies weakness, numbness, tingling Skin: Denies rashes, bruising Psych: Denies depression, anxiety, hallucinations     Objective:    Blood pressure 110/78, pulse 82, temperature 98.1 F (36.7 C), temperature source Oral, weight 188 lb (85.3 kg), SpO2 98 %.   Gen. Pleasant, well-nourished, in no distress, normal affect   Lungs: no accessory muscle use, CTAB, no wheezes or rales Cardiovascular: RRR, no m/r/g, no peripheral edema Neuro:  A&Ox3, CN II-XII intact, normal gait   Wt Readings from Last 3 Encounters:  08/03/18 188 lb (85.3 kg)  01/11/18 193 lb 3.2 oz (87.6 kg)  01/05/18 194 lb (88 kg)    Lab Results  Component Value Date   WBC 10.9 (H) 01/05/2018   HGB 15.1 01/05/2018   HCT 43.1 01/05/2018   PLT 319 01/05/2018    GLUCOSE 132 (H) 01/05/2018   CHOL 200 10/11/2017   TRIG 136.0 10/11/2017   HDL 51.20 10/11/2017   LDLCALC 121 (H) 10/11/2017   ALT 17 10/11/2017   AST 17 10/11/2017   NA 137 01/05/2018   K 3.9 01/05/2018   CL 101 01/05/2018   CREATININE 1.20 01/05/2018   BUN 16 01/05/2018   CO2 24 01/05/2018   TSH 1.46 10/11/2017   PSA 0.48 10/11/2017   HGBA1C 5.4 08/06/2016    Assessment/Plan:  Essential hypertension  -controlled -Discussed continuing half dose of lisinopril-HCTZ 20-12.5 mg and checking BP regularly. -BP continues to remain controlled will plan to d/c completely  F/u in 2-3 months, sooner if needed  Abbe Amsterdam, MD

## 2018-08-04 ENCOUNTER — Encounter: Payer: Self-pay | Admitting: Family Medicine

## 2018-09-06 ENCOUNTER — Ambulatory Visit: Payer: BLUE CROSS/BLUE SHIELD | Admitting: Family Medicine

## 2018-09-06 ENCOUNTER — Encounter: Payer: Self-pay | Admitting: Family Medicine

## 2018-09-06 VITALS — BP 126/82 | HR 72 | Temp 97.6°F | Wt 189.0 lb

## 2018-09-06 DIAGNOSIS — I10 Essential (primary) hypertension: Secondary | ICD-10-CM | POA: Diagnosis not present

## 2018-09-06 DIAGNOSIS — G44209 Tension-type headache, unspecified, not intractable: Secondary | ICD-10-CM | POA: Diagnosis not present

## 2018-09-06 DIAGNOSIS — I491 Atrial premature depolarization: Secondary | ICD-10-CM

## 2018-09-06 NOTE — Progress Notes (Signed)
Subjective:    Patient ID: Dustin [name], male    DOB: 1961/01/08, 57 y.o.   MRN: [id number]  No chief complaint on file.   HPI Patient was seen today for acute concern.  Pt notes increased bp, increased chest pain, and HAs since returning from vacation at [employer] last wk.  Pt initially noted the elevation in bp after getting on a few rollar coasters at the park.  Pt notes bp was 130s-140s/80-90s.  Pt notes he was not drinking as much water, was staying up later, and was eating differently than he normally does.  He also noted back pain.  Since being back, pt notes last wk he had HAs, continued back pain/soreness, and chest pain.  Pt notes symptoms have began to improve.   HAs start with muscle soreness in his shoulders, then up his neck, into pain in the occipital and parietal area of his head with sinus pressure.  Pt has taken Tylenol and ASA for his symptoms.  Pt does not drink mych caffien.  May have a glass of tea per day.  Does endorse not sleeping as well since being back from his trip.  Also notes worrying about his son who is in [employer].  Pt notes his chest pain is his typically chest pain a/w PACs.  He denies numbness in arms or face, n/v.  Past Medical History:  Diagnosis Date  . GERD (gastroesophageal reflux disease)    occasionally with no meds  . Hypertension   . 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 Other Swelling    Reaction to novocaine at 57 years old (dentist's office)    ROS General: Denies fever, chills, night sweats, changes in weight, changes in appetite HEENT: Denies ear pain, changes in vision, rhinorrhea, sore throat  +HAs, sensation of sinus pressure. CV: Denies palpitations, SOB, orthopnea  +CP Pulm: Denies SOB, cough, wheezing GI: Denies abdominal pain, nausea, vomiting, diarrhea, constipation GU: Denies dysuria, hematuria, frequency, vaginal discharge Msk: Denies muscle cramps, joint pains  +back, neck, and  shoulder pain Neuro: Denies weakness, numbness, tingling Skin: Denies rashes, bruising Psych: Denies depression, hallucinations  +anxiety     Objective:    Blood pressure 126/82, pulse 72, temperature 97.6 F (36.4 C), temperature source Oral, weight 189 lb (85.7 kg), SpO2 98 %.   Gen. Pleasant, well-nourished, in no distress, normal affect   HEENT: [place]/AT, face symmetric, no scleral icterus, PERRLA, nares patent without drainage Lungs: no accessory muscle use, CTAB, no wheezes or rales Cardiovascular: RRR, no m/r/g, no peripheral edema.  Upon repeat ascultation 1 skipped beat noted. Musculoskeletal: No deformities, no cyanosis or clubbing, normal tone Neuro:  A&Ox3, CN II-XII intact, normal gait  Wt Readings from Last 3 Encounters:  09/06/18 189 lb (85.7 kg)  08/03/18 188 lb (85.3 kg)  01/11/18 193 lb 3.2 oz (87.6 kg)    Lab Results  Component Value Date   WBC 10.9 (H) 01/05/2018   HGB 15.1 01/05/2018   HCT 43.1 01/05/2018   PLT 319 01/05/2018   GLUCOSE 132 (H) 01/05/2018   CHOL 200 10/11/2017   TRIG 136.0 10/11/2017   HDL 51.20 10/11/2017   LDLCALC 121 (H) 10/11/2017   ALT 17 10/11/2017   AST 17 10/11/2017   NA 137 01/05/2018   K 3.9 01/05/2018   CL 101 01/05/2018   CREATININE 1.20 01/05/2018   BUN 16 01/05/2018   CO2 24 01/05/2018   TSH 1.46 10/11/2017   PSA  0.48 10/11/2017   HGBA1C 5.4 08/06/2016    Assessment/Plan:  Essential hypertension -controlled at this time. -pt to continue 1/2 tab of lisinopril-HCTZ 20-12.5 mg daily. -pt encouraged to continue checking bp at home. -encouraged to stay hydrated and reduce sodium intake. -continue lifestyle modifications.  Tension headache -discussed was to reduce stress -continue staying hydrated and controlling bp. -given handout  H/o PACs -elevated bp, anxiety, and changes in sleep likely contributed to PACs  F/u prn  Abbe Amsterdam, MD

## 2018-09-06 NOTE — Patient Instructions (Signed)
Tension Headache A tension headache is a feeling of pain, pressure, or aching that is often felt over the front and sides of the head. The pain can be dull, or it can feel tight (constricting). Tension headaches are not normally associated with nausea or vomiting, and they do not get worse with physical activity. Tension headaches can last from 30 minutes to several days. This is the most common type of headache. CAUSES The exact cause of this condition is not known. Tension headaches often begin after stress, anxiety, or depression. Other triggers may include:  Alcohol.  Too much caffeine, or caffeine withdrawal.  Respiratory infections, such as colds, flu, or sinus infections.  Dental problems or teeth clenching.  Fatigue.  Holding your head and neck in the same position for a long period of time, such as while using a computer.  Smoking. SYMPTOMS Symptoms of this condition include:  A feeling of pressure around the head.  Dull, aching head pain.  Pain felt over the front and sides of the head.  Tenderness in the muscles of the head, neck, and shoulders. DIAGNOSIS This condition may be diagnosed based on your symptoms and a physical exam. Tests may be done, such as a CT scan or an MRI of your head. These tests may be done if your symptoms are severe or unusual. TREATMENT This condition may be treated with lifestyle changes and medicines to help relieve symptoms. HOME CARE INSTRUCTIONS Managing Pain  Take over-the-counter and prescription medicines only as told by your health care provider.  Lie down in a dark, quiet room when you have a headache.  If directed, apply ice to the head and neck area: ? Put ice in a plastic bag. ? Place a towel between your skin and the bag. ? Leave the ice on for 20 minutes, 2-3 times per day.  Use a heating pad or a hot shower to apply heat to the head and neck area as told by your health care provider. Eating and Drinking  Eat meals on  a regular schedule.  Limit alcohol use.  Decrease your caffeine intake, or stop using caffeine. General Instructions  Keep all follow-up visits as told by your health care provider. This is important.  Keep a headache journal to help find out what may trigger your headaches. For example, write down: ? What you eat and drink. ? How much sleep you get. ? Any change to your diet or medicines.  Try massage or other relaxation techniques.  Limit stress.  Sit up straight, and avoid tensing your muscles.  Do not use tobacco products, including cigarettes, chewing tobacco, or e-cigarettes. If you need help quitting, ask your health care provider.  Exercise regularly as told by your health care provider.  Get 7-9 hours of sleep, or the amount recommended by your health care provider. SEEK MEDICAL CARE IF:  Your symptoms are not helped by medicine.  You have a headache that is different from what you normally experience.  You have nausea or you vomit.  You have a fever. SEEK IMMEDIATE MEDICAL CARE IF:  Your headache becomes severe.  You have repeated vomiting.  You have a stiff neck.  You have a loss of vision.  You have problems with speech.  You have pain in your eye or ear.  You have muscular weakness or loss of muscle control.  You lose your balance or you have trouble walking.  You feel faint or you pass out.  You have confusion. This information   is not intended to replace advice given to you by your health care provider. Make sure you discuss any questions you have with your health care provider. Document Released: 10/25/2005 Document Revised: 07/16/2015 Document Reviewed: 02/17/2015 Elsevier Interactive Patient Education  2017 [employer]  Managing Your Hypertension Hypertension is commonly called high blood pressure. This is when the force of your blood pressing against the walls of your arteries is too strong. Arteries are blood vessels that carry blood  from your heart throughout your body. Hypertension forces the heart to work harder to pump blood, and may cause the arteries to become narrow or stiff. Having untreated or uncontrolled hypertension can cause heart attack, stroke, kidney disease, and other problems. What are blood pressure readings? A blood pressure reading consists of a higher number over a lower number. Ideally, your blood pressure should be below 120/80. The first ("top") number is called the systolic pressure. It is a measure of the pressure in your arteries as your heart beats. The second ("bottom") number is called the diastolic pressure. It is a measure of the pressure in your arteries as the heart relaxes. What does my blood pressure reading mean? Blood pressure is classified into four stages. Based on your blood pressure reading, your health care provider may use the following stages to determine what type of treatment you need, if any. Systolic pressure and diastolic pressure are measured in a unit called mm Hg. Normal  Systolic pressure: below 120.  Diastolic pressure: below 80. Elevated  Systolic pressure: 120-129.  Diastolic pressure: below 80. Hypertension stage 1  Systolic pressure: 130-139.  Diastolic pressure: 80-89. Hypertension stage 2  Systolic pressure: 140 or above.  Diastolic pressure: 90 or above. What health risks are associated with hypertension? Managing your hypertension is an important responsibility. Uncontrolled hypertension can lead to:  A heart attack.  A stroke.  A weakened blood vessel (aneurysm).  Heart failure.  Kidney damage.  Eye damage.  Metabolic syndrome.  Memory and concentration problems.  What changes can I make to manage my hypertension? Hypertension can be managed by making lifestyle changes and possibly by taking medicines. Your health care provider will help you make a plan to bring your blood pressure within a normal range. Eating and drinking  Eat a  diet that is high in fiber and potassium, and low in salt (sodium), added sugar, and fat. An example eating plan is called the DASH (Dietary Approaches to Stop Hypertension) diet. To eat this way: ? Eat plenty of fresh fruits and vegetables. Try to fill half of your plate at each meal with fruits and vegetables. ? Eat whole grains, such as whole wheat pasta, brown rice, or whole grain bread. Fill about one quarter of your plate with whole grains. ? Eat low-fat diary products. ? Avoid fatty cuts of meat, processed or cured meats, and poultry with skin. Fill about one quarter of your plate with lean proteins such as fish, chicken without skin, beans, eggs, and tofu. ? Avoid premade and processed foods. These tend to be higher in sodium, added sugar, and fat.  Reduce your daily sodium intake. Most people with hypertension should eat less than 1,500 mg of sodium a day.  Limit alcohol intake to no more than 1 drink a day for nonpregnant women and 2 drinks a day for men. One drink equals 12 oz of beer, 5 oz of wine, or 1 oz of hard liquor. Lifestyle  Work with your health care provider to maintain a healthy  body weight, or to lose weight. Ask what an ideal weight is for you.  Get at least 30 minutes of exercise that causes your heart to beat faster (aerobic exercise) most days of the week. Activities may include walking, swimming, or biking.  Include exercise to strengthen your muscles (resistance exercise), such as weight lifting, as part of your weekly exercise routine. Try to do these types of exercises for 30 minutes at least 3 days a week.  Do not use any products that contain nicotine or tobacco, such as cigarettes and e-cigarettes. If you need help quitting, ask your health care provider.  Control any long-term (chronic) conditions you have, such as high cholesterol or diabetes. Monitoring  Monitor your blood pressure at home as told by your health care provider. Your personal target blood  pressure may vary depending on your medical conditions, your age, and other factors.  Have your blood pressure checked regularly, as often as told by your health care provider. Working with your health care provider  Review all the medicines you take with your health care provider because there may be side effects or interactions.  Talk with your health care provider about your diet, exercise habits, and other lifestyle factors that may be contributing to hypertension.  Visit your health care provider regularly. Your health care provider can help you create and adjust your plan for managing hypertension. Will I need medicine to control my blood pressure? Your health care provider may prescribe medicine if lifestyle changes are not enough to get your blood pressure under control, and if:  Your systolic blood pressure is 130 or higher.  Your diastolic blood pressure is 80 or higher.  Take medicines only as told by your health care provider. Follow the directions carefully. Blood pressure medicines must be taken as prescribed. The medicine does not work as well when you skip doses. Skipping doses also puts you at risk for problems. Contact a health care provider if:  You think you are having a reaction to medicines you have taken.  You have repeated (recurrent) headaches.  You feel dizzy.  You have swelling in your ankles.  You have trouble with your vision. Get help right away if:  You develop a severe headache or confusion.  You have unusual weakness or numbness, or you feel faint.  You have severe pain in your chest or abdomen.  You vomit repeatedly.  You have trouble breathing. Summary  Hypertension is when the force of blood pumping through your arteries is too strong. If this condition is not controlled, it may put you at risk for serious complications.  Your personal target blood pressure may vary depending on your medical conditions, your age, and other factors. For  most people, a normal blood pressure is less than 120/80.  Hypertension is managed by lifestyle changes, medicines, or both. Lifestyle changes include weight loss, eating a healthy, low-sodium diet, exercising more, and limiting alcohol. This information is not intended to replace advice given to you by your health care provider. Make sure you discuss any questions you have with your health care provider. Document Released: 07/19/2012 Document Revised: 09/22/2016 Document Reviewed: 09/22/2016 Elsevier Interactive Patient Education  [employer].

## 2018-10-12 ENCOUNTER — Ambulatory Visit (INDEPENDENT_AMBULATORY_CARE_PROVIDER_SITE_OTHER): Payer: BLUE CROSS/BLUE SHIELD | Admitting: Family Medicine

## 2018-10-12 ENCOUNTER — Encounter: Payer: Self-pay | Admitting: Family Medicine

## 2018-10-12 VITALS — BP 128/70 | HR 86 | Temp 97.6°F | Ht 73.0 in | Wt 185.0 lb

## 2018-10-12 DIAGNOSIS — I10 Essential (primary) hypertension: Secondary | ICD-10-CM

## 2018-10-12 DIAGNOSIS — Z23 Encounter for immunization: Secondary | ICD-10-CM | POA: Diagnosis not present

## 2018-10-12 DIAGNOSIS — H6122 Impacted cerumen, left ear: Secondary | ICD-10-CM

## 2018-10-12 DIAGNOSIS — Z125 Encounter for screening for malignant neoplasm of prostate: Secondary | ICD-10-CM

## 2018-10-12 DIAGNOSIS — Z13220 Encounter for screening for lipoid disorders: Secondary | ICD-10-CM

## 2018-10-12 DIAGNOSIS — Z0000 Encounter for general adult medical examination without abnormal findings: Secondary | ICD-10-CM

## 2018-10-12 DIAGNOSIS — Z131 Encounter for screening for diabetes mellitus: Secondary | ICD-10-CM

## 2018-10-12 LAB — CBC WITH DIFFERENTIAL/PLATELET
BASOS ABS: 0 10*3/uL (ref 0.0–0.1)
Basophils Relative: 0.6 % (ref 0.0–3.0)
Eosinophils Absolute: 0 10*3/uL (ref 0.0–0.7)
Eosinophils Relative: 0.7 % (ref 0.0–5.0)
HEMATOCRIT: 42.9 % (ref 39.0–52.0)
Hemoglobin: 14.6 g/dL (ref 13.0–17.0)
LYMPHS PCT: 22.4 % (ref 12.0–46.0)
Lymphs Abs: 1.5 10*3/uL (ref 0.7–4.0)
MCHC: 34 g/dL (ref 30.0–36.0)
MCV: 90.8 fl (ref 78.0–100.0)
MONOS PCT: 6.2 % (ref 3.0–12.0)
Monocytes Absolute: 0.4 10*3/uL (ref 0.1–1.0)
NEUTROS ABS: 4.6 10*3/uL (ref 1.4–7.7)
Neutrophils Relative %: 70.1 % (ref 43.0–77.0)
Platelets: 304 10*3/uL (ref 150.0–400.0)
RBC: 4.73 Mil/uL (ref 4.22–5.81)
RDW: 13.5 % (ref 11.5–15.5)
WBC: 6.6 10*3/uL (ref 4.0–10.5)

## 2018-10-12 LAB — LIPID PANEL
Cholesterol: 188 mg/dL (ref 0–200)
HDL: 58.3 mg/dL (ref >39.00)
LDL CALC: 116 mg/dL — AB (ref 0–99)
NonHDL: 129.68
Total CHOL/HDL Ratio: 3
Triglycerides: 68 mg/dL (ref 0.0–149.0)
VLDL: 13.6 mg/dL (ref 0.0–40.0)

## 2018-10-12 LAB — PSA: PSA: 0.37 ng/mL (ref 0.10–4.00)

## 2018-10-12 LAB — BASIC METABOLIC PANEL
BUN: 25 mg/dL — AB (ref 6–23)
CHLORIDE: 102 meq/L (ref 96–112)
CO2: 28 mEq/L (ref 19–32)
CREATININE: 1.07 mg/dL (ref 0.40–1.50)
Calcium: 9.6 mg/dL (ref 8.4–10.5)
GFR: 75.13 mL/min (ref >60.00)
Glucose, Bld: 105 mg/dL — ABNORMAL HIGH (ref 70–99)
Potassium: 4.9 mEq/L (ref 3.5–5.1)
Sodium: 138 mEq/L (ref 135–145)

## 2018-10-12 LAB — HEMOGLOBIN A1C: Hgb A1c MFr Bld: 5.6 % (ref 4.6–6.5)

## 2018-10-12 NOTE — Addendum Note (Signed)
Addended by: Carola RhineKIGOTHO, NANCY N on: 10/12/2018 10:03 AM   Modules accepted: Orders

## 2018-10-12 NOTE — Patient Instructions (Addendum)
Preventive Care 40-64 Years, Male Preventive care refers to lifestyle choices and visits with your health care provider that can promote health and wellness. What does preventive care include?  A yearly physical exam. This is also called an annual well check.  Dental exams once or twice a year.  Routine eye exams. Ask your health care provider how often you should have your eyes checked.  Personal lifestyle choices, including: ? Daily care of your teeth and gums. ? Regular physical activity. ? Eating a healthy diet. ? Avoiding tobacco and drug use. ? Limiting alcohol use. ? Practicing safe sex. ? Taking low-dose aspirin every day starting at age 39. What happens during an annual well check? The services and screenings done by your health care provider during your annual well check will depend on your age, overall health, lifestyle risk factors, and family history of disease. Counseling Your health care provider may ask you questions about your:  Alcohol use.  Tobacco use.  Drug use.  Emotional well-being.  Home and relationship well-being.  Sexual activity.  Eating habits.  Work and work Statistician.  Screening You may have the following tests or measurements:  Height, weight, and BMI.  Blood pressure.  Lipid and cholesterol levels. These may be checked every 5 years, or more frequently if you are over 76 years old.  Skin check.  Lung cancer screening. You may have this screening every year starting at age 19 if you have a 30-pack-year history of smoking and currently smoke or have quit within the past 15 years.  Fecal occult blood test (FOBT) of the stool. You may have this test every year starting at age 26.  Flexible sigmoidoscopy or colonoscopy. You may have a sigmoidoscopy every 5 years or a colonoscopy every 10 years starting at age 17.  Prostate cancer screening. Recommendations will vary depending on your family history and other risks.  Hepatitis C  blood test.  Hepatitis B blood test.  Sexually transmitted disease (STD) testing.  Diabetes screening. This is done by checking your blood sugar (glucose) after you have not eaten for a while (fasting). You may have this done every 1-3 years.  Discuss your test results, treatment options, and if necessary, the need for more tests with your health care provider. Vaccines Your health care provider may recommend certain vaccines, such as:  Influenza vaccine. This is recommended every year.  Tetanus, diphtheria, and acellular pertussis (Tdap, Td) vaccine. You may need a Td booster every 10 years.  Varicella vaccine. You may need this if you have not been vaccinated.  Zoster vaccine. You may need this after age 79.  Measles, mumps, and rubella (MMR) vaccine. You may need at least one dose of MMR if you were born in 1957 or later. You may also need a second dose.  Pneumococcal 13-valent conjugate (PCV13) vaccine. You may need this if you have certain conditions and have not been vaccinated.  Pneumococcal polysaccharide (PPSV23) vaccine. You may need one or two doses if you smoke cigarettes or if you have certain conditions.  Meningococcal vaccine. You may need this if you have certain conditions.  Hepatitis A vaccine. You may need this if you have certain conditions or if you travel or work in places where you may be exposed to hepatitis A.  Hepatitis B vaccine. You may need this if you have certain conditions or if you travel or work in places where you may be exposed to hepatitis B.  Haemophilus influenzae type b (Hib) vaccine.  You may need this if you have certain risk factors.  Talk to your health care provider about which screenings and vaccines you need and how often you need them. This information is not intended to replace advice given to you by your health care provider. Make sure you discuss any questions you have with your health care provider. Document Released: 11/21/2015  Document Revised: 07/14/2016 Document Reviewed: 08/26/2015 Elsevier Interactive Patient Education  2018 [place], Adult The ears produce a substance called earwax that helps keep bacteria out of the ear and protects the skin in the ear canal. Occasionally, earwax can build up in the ear and cause discomfort or hearing loss. What increases the risk? This condition is more likely to develop in people who:  Are male.  Are elderly.  Naturally produce more earwax.  Clean their ears often with cotton swabs.  Use earplugs often.  Use in-ear headphones often.  Wear hearing aids.  Have narrow ear canals.  Have earwax that is overly thick or sticky.  Have eczema.  Are dehydrated.  Have excess hair in the ear canal.  What are the signs or symptoms? Symptoms of this condition include:  Reduced or muffled hearing.  A feeling of fullness in the ear or feeling that the ear is plugged.  Fluid coming from the ear.  Ear pain.  Ear itch.  Ringing in the ear.  Coughing.  An obvious piece of earwax that can be seen inside the ear canal.  How is this diagnosed? This condition may be diagnosed based on:  Your symptoms.  Your medical history.  An ear exam. During the exam, your health care provider will look into your ear with an instrument called an otoscope.  You may have tests, including a hearing test. How is this treated? This condition may be treated by:  Using ear drops to soften the earwax.  Having the earwax removed by a health care provider. The health care provider may: ? Flush the ear with water. ? Use an instrument that has a loop on the end (curette). ? Use a suction device.  Surgery to remove the wax buildup. This may be done in severe cases.  Follow these instructions at home:  Take over-the-counter and prescription medicines only as told by your health care provider.  Do not put any objects, including cotton swabs, into your  ear. You can clean the opening of your ear canal with a washcloth or facial tissue.  Follow instructions from your health care provider about cleaning your ears. Do not over-clean your ears.  Drink enough fluid to keep your urine clear or pale yellow. This will help to thin the earwax.  Keep all follow-up visits as told by your health care provider. If earwax builds up in your ears often or if you use hearing aids, consider seeing your health care provider for routine, preventive ear cleanings. Ask your health care provider how often you should schedule your cleanings.  If you have hearing aids, clean them according to instructions from the manufacturer and your health care provider. Contact a health care provider if:  You have ear pain.  You develop a fever.  You have blood, pus, or other fluid coming from your ear.  You have hearing loss.  You have ringing in your ears that does not go away.  Your symptoms do not improve with treatment.  You feel like the room is spinning (vertigo). Summary  Earwax can build up in the ear  and cause discomfort or hearing loss.  The most common symptoms of this condition include reduced or muffled hearing and a feeling of fullness in the ear or feeling that the ear is plugged.  This condition may be diagnosed based on your symptoms, your medical history, and an ear exam.  This condition may be treated by using ear drops to soften the earwax or by having the earwax removed by a health care provider.  Do not put any objects, including cotton swabs, into your ear. You can clean the opening of your ear canal with a washcloth or facial tissue. This information is not intended to replace advice given to you by your health care provider. Make sure you discuss any questions you have with your health care provider. Document Released: 12/02/2004 Document Revised: 01/05/2017 Document Reviewed: 01/05/2017 Elsevier Interactive Patient Education  [employer].

## 2018-10-12 NOTE — Progress Notes (Addendum)
Subjective:     Dustin [name] is a 57 y.o. male and is here for a comprehensive physical exam. The patient reports no problems.  BP- taking 1/2 lisinopril-hydrochlorothiazide 20-12.5 mg tab.  Checking BP at home.  Has been running 115/76, 133/74, 129/84, 121/78, 119/80, 100/68, 103/66, 101/67, 125/80.  Pt endorsed HA when BP was low.  Drinking at least 3 16 ounce bottles of water per day in addition to other fluids.  Needs influenza vaccine. Social History   Socioeconomic History  . Marital status: Married    Spouse name: Not on file  . Number of children: Not on file  . Years of education: Not on file  . Highest education level: Not on file  Occupational History  . Not on file  Social Needs  . Financial resource strain: Not on file  . Food insecurity:    Worry: Not on file    Inability: Not on file  . Transportation needs:    Medical: Not on file    Non-medical: Not on file  Tobacco Use  . Smoking status: Never Smoker  . Smokeless tobacco: Never Used  Substance and Sexual Activity  . Alcohol use: Yes    Comment: occ beer  . Drug use: No  . Sexual activity: Yes  Lifestyle  . Physical activity:    Days per week: Not on file    Minutes per session: Not on file  . Stress: Not on file  Relationships  . Social connections:    Talks on phone: Not on file    Gets together: Not on file    Attends religious service: Not on file    Active member of club or organization: Not on file    Attends meetings of clubs or organizations: Not on file    Relationship status: Not on file  . Intimate partner violence:    Fear of current or ex partner: Not on file    Emotionally abused: Not on file    Physically abused: Not on file    Forced sexual activity: Not on file  Other Topics Concern  . Not on file  Social History Narrative  . Not on file   Health Maintenance  Topic Date Due  . HIV Screening  07/27/1974  . INFLUENZA VACCINE  06/08/2018  . TETANUS/TDAP  05/28/2024  .  COLONOSCOPY  10/29/2027  . Hepatitis C Screening  Completed    The following portions of the patient's history were reviewed and updated as appropriate: allergies, current medications, past family history, past medical history, past social history, past surgical history and problem list.  Review of Systems A comprehensive review of systems was negative.   Objective:    BP 128/70   Pulse 86   Temp 97.6 F (36.4 C)   Ht 6\' 1"  (1.854 m)   Wt 185 lb (83.9 kg)   BMI 24.41 kg/m  General appearance: alert, cooperative and no distress Head: Normocephalic, without obvious abnormality, atraumatic Eyes: conjunctivae/corneas clear. PERRL, EOM's intact. Fundi benign. Ears: Left canal occluded with cerumen.  Left ear irrigated.  After irrigation, b/l TM's and external ear canals normal Nose: Nares normal. Septum midline. Mucosa normal. No drainage or sinus tenderness. Throat: lips, mucosa, and tongue normal; teeth and gums normal Neck: no adenopathy, no carotid bruit, no JVD, supple, symmetrical, trachea midline and thyroid not enlarged, symmetric, no tenderness/mass/nodules Lungs: clear to auscultation bilaterally Heart: regular rate and rhythm, S1, S2 normal, no murmur, click, rub or gallop Abdomen: soft, non-tender;  bowel sounds normal; no masses,  no organomegaly Extremities: extremities normal, atraumatic, no cyanosis or edema Skin: Skin color, texture, turgor normal. No rashes or lesions Neurologic: Alert and oriented X 3, normal strength and tone. Normal symmetric reflexes. Normal coordination and gait    Assessment:    Healthy male exam with cerumen impaction.     Plan:     Anticipatory guidance given including wearing seatbelts, smoke detectors in the home, increasing physical activity, increasing p.o. intake of water and vegetables. -will obtain labs (CBC, BMP, lipid panel, hemoglobin A1c, PSA) -Influenza vaccine given this visit -handout given -next CPE in 1 yr See After  Visit Summary for Counseling Recommendations    Cerumen impaction -consent obtained.  Left ear irrigated.  Patient tolerated procedure well. -Given handout  Hypertension -Continue taking one half tab of lisinopril-hydrochlorothiazide 20-12.5 mg tablets -Continue checking BP at home -Continue increasing p.o. hydration -BP remains controlled.  Blood pressure medicine in the future.  Follow-up PRN  Abbe Amsterdam, MD

## 2018-10-16 ENCOUNTER — Other Ambulatory Visit: Payer: Self-pay | Admitting: Internal Medicine

## 2018-11-27 DIAGNOSIS — W540XXA Bitten by dog, initial encounter: Secondary | ICD-10-CM | POA: Diagnosis not present

## 2018-11-27 DIAGNOSIS — S61459A Open bite of unspecified hand, initial encounter: Secondary | ICD-10-CM | POA: Diagnosis not present

## 2018-11-28 DIAGNOSIS — L03119 Cellulitis of unspecified part of limb: Secondary | ICD-10-CM | POA: Diagnosis not present

## 2018-11-28 DIAGNOSIS — M25531 Pain in right wrist: Secondary | ICD-10-CM | POA: Diagnosis not present

## 2018-11-28 DIAGNOSIS — S61559A Open bite of unspecified wrist, initial encounter: Secondary | ICD-10-CM | POA: Diagnosis not present

## 2018-11-28 DIAGNOSIS — M25532 Pain in left wrist: Secondary | ICD-10-CM | POA: Diagnosis not present

## 2018-11-29 DIAGNOSIS — L03119 Cellulitis of unspecified part of limb: Secondary | ICD-10-CM | POA: Diagnosis not present

## 2019-01-09 ENCOUNTER — Encounter: Payer: Self-pay | Admitting: Family Medicine

## 2019-01-10 ENCOUNTER — Other Ambulatory Visit: Payer: Self-pay | Admitting: Family Medicine

## 2019-01-10 MED ORDER — LISINOPRIL-HYDROCHLOROTHIAZIDE 20-12.5 MG PO TABS: 0.5000 | ORAL_TABLET | Freq: Every day | ORAL | 3 refills | Status: DC

## 2019-04-18 ENCOUNTER — Encounter: Payer: Self-pay | Admitting: Family Medicine

## 2019-04-19 ENCOUNTER — Emergency Department (HOSPITAL_COMMUNITY): Payer: BLUE CROSS/BLUE SHIELD

## 2019-04-19 ENCOUNTER — Observation Stay (HOSPITAL_COMMUNITY)
Admission: EM | Admit: 2019-04-19 | Discharge: 2019-04-20 | Disposition: A | Discharge status: Home / Self Care | Payer: BLUE CROSS/BLUE SHIELD | Attending: Internal Medicine | Admitting: Internal Medicine

## 2019-04-19 ENCOUNTER — Other Ambulatory Visit: Payer: Self-pay

## 2019-04-19 ENCOUNTER — Encounter (HOSPITAL_COMMUNITY): Payer: Self-pay | Admitting: Emergency Medicine

## 2019-04-19 ENCOUNTER — Ambulatory Visit: Payer: Self-pay

## 2019-04-19 DIAGNOSIS — R0602 Shortness of breath: Secondary | ICD-10-CM

## 2019-04-19 DIAGNOSIS — I200 Unstable angina: Secondary | ICD-10-CM | POA: Diagnosis not present

## 2019-04-19 DIAGNOSIS — I10 Essential (primary) hypertension: Secondary | ICD-10-CM | POA: Insufficient documentation

## 2019-04-19 DIAGNOSIS — Z20828 Contact with and (suspected) exposure to other viral communicable diseases: Secondary | ICD-10-CM | POA: Insufficient documentation

## 2019-04-19 DIAGNOSIS — Z79899 Other long term (current) drug therapy: Secondary | ICD-10-CM | POA: Insufficient documentation

## 2019-04-19 DIAGNOSIS — Z9049 Acquired absence of other specified parts of digestive tract: Secondary | ICD-10-CM | POA: Insufficient documentation

## 2019-04-19 DIAGNOSIS — R7302 Impaired glucose tolerance (oral): Secondary | ICD-10-CM | POA: Diagnosis present

## 2019-04-19 DIAGNOSIS — R0789 Other chest pain: Secondary | ICD-10-CM | POA: Diagnosis not present

## 2019-04-19 DIAGNOSIS — R519 Headache, unspecified: Secondary | ICD-10-CM

## 2019-04-19 DIAGNOSIS — R51 Headache: Secondary | ICD-10-CM | POA: Insufficient documentation

## 2019-04-19 DIAGNOSIS — R079 Chest pain, unspecified: Secondary | ICD-10-CM

## 2019-04-19 DIAGNOSIS — K7689 Other specified diseases of liver: Secondary | ICD-10-CM | POA: Diagnosis not present

## 2019-04-19 LAB — COMPREHENSIVE METABOLIC PANEL
ALT: 19 U/L (ref 0–44)
AST: 22 U/L (ref 15–41)
Albumin: 4.2 g/dL (ref 3.5–5.0)
Alkaline Phosphatase: 37 U/L — ABNORMAL LOW (ref 38–126)
Anion gap: 9 (ref 5–15)
BUN: 17 mg/dL (ref 6–20)
CO2: 25 mmol/L (ref 22–32)
Calcium: 9.4 mg/dL (ref 8.9–10.3)
Chloride: 104 mmol/L (ref 98–111)
Creatinine, Ser: 1.11 mg/dL (ref 0.61–1.24)
GFR calc Af Amer: >60 mL/min (ref >60)
GFR calc non Af Amer: >60 mL/min (ref >60)
Glucose, Bld: 110 mg/dL — ABNORMAL HIGH (ref 70–99)
Potassium: 4.4 mmol/L (ref 3.5–5.1)
Sodium: 138 mmol/L (ref 135–145)
Total Bilirubin: 1 mg/dL (ref 0.3–1.2)
Total Protein: 7.4 g/dL (ref 6.5–8.1)

## 2019-04-19 LAB — CBC WITH DIFFERENTIAL/PLATELET
Abs Immature Granulocytes: 0.03 10*3/uL (ref 0.00–0.07)
Basophils Absolute: 0 10*3/uL (ref 0.0–0.1)
Basophils Relative: 0 %
Eosinophils Absolute: 0 10*3/uL (ref 0.0–0.5)
Eosinophils Relative: 1 %
HCT: 42.4 % (ref 39.0–52.0)
Hemoglobin: 14.5 g/dL (ref 13.0–17.0)
Immature Granulocytes: 0 %
Lymphocytes Relative: 20 %
Lymphs Abs: 1.4 10*3/uL (ref 0.7–4.0)
MCH: 30.9 pg (ref 26.0–34.0)
MCHC: 34.2 g/dL (ref 30.0–36.0)
MCV: 90.4 fL (ref 80.0–100.0)
Monocytes Absolute: 0.5 10*3/uL (ref 0.1–1.0)
Monocytes Relative: 7 %
Neutro Abs: 5 10*3/uL (ref 1.7–7.7)
Neutrophils Relative %: 72 %
Platelets: 270 10*3/uL (ref 150–400)
RBC: 4.69 MIL/uL (ref 4.22–5.81)
RDW: 12.5 % (ref 11.5–15.5)
WBC: 7 10*3/uL (ref 4.0–10.5)
nRBC: 0 % (ref 0.0–0.2)

## 2019-04-19 LAB — TSH: TSH: 0.891 u[IU]/mL (ref 0.350–4.500)

## 2019-04-19 LAB — TROPONIN I
Troponin I: <0.03 ng/mL (ref <0.03)
Troponin I: <0.03 ng/mL (ref <0.03)

## 2019-04-19 LAB — MAGNESIUM: Magnesium: 2.1 mg/dL (ref 1.7–2.4)

## 2019-04-19 LAB — SARS CORONAVIRUS 2: SARS Coronavirus 2: NOT DETECTED

## 2019-04-19 LAB — HEMOGLOBIN A1C
Hgb A1c MFr Bld: 5.5 % (ref 4.8–5.6)
Mean Plasma Glucose: 111.15 mg/dL

## 2019-04-19 MED ORDER — ATORVASTATIN CALCIUM 40 MG PO TABS: 40.0000 mg | ORAL_TABLET | Freq: Every day | ORAL | Status: DC

## 2019-04-19 MED ORDER — METOPROLOL TARTRATE 12.5 MG HALF TABLET: 12.5000 mg | ORAL_TABLET | Freq: Once | ORAL | Status: DC

## 2019-04-19 MED ORDER — METOPROLOL TARTRATE 25 MG PO TABS: 12.5000 mg | ORAL_TABLET | Freq: Two times a day (BID) | ORAL | Status: DC

## 2019-04-19 MED ORDER — ONDANSETRON HCL 4 MG/2ML IJ SOLN: 4.0000 mg | Freq: Four times a day (QID) | INTRAMUSCULAR | Status: DC | PRN

## 2019-04-19 MED ORDER — HEPARIN (PORCINE) 25000 UT/250ML-% IV SOLN
1100.0000 [IU]/h | INTRAVENOUS | Status: DC
  Administered 2019-04-19: 1000 [IU]/h via INTRAVENOUS
  Filled 2019-04-19 (×2): qty 250

## 2019-04-19 MED ORDER — SODIUM CHLORIDE 0.9% FLUSH: 3.0000 mL | INTRAVENOUS | Status: DC | PRN

## 2019-04-19 MED ORDER — ASPIRIN 81 MG PO CHEW: 324.0000 mg | CHEWABLE_TABLET | Freq: Once | ORAL | Status: DC

## 2019-04-19 MED ORDER — IOHEXOL 350 MG/ML SOLN
100.0000 mL | Freq: Once | INTRAVENOUS | Status: AC | PRN
  Administered 2019-04-19: 100 mL via INTRAVENOUS

## 2019-04-19 MED ORDER — SODIUM CHLORIDE 0.9% FLUSH: 3.0000 mL | Freq: Two times a day (BID) | INTRAVENOUS | Status: DC

## 2019-04-19 MED ORDER — SODIUM CHLORIDE 0.9 % IV SOLN: 250.0000 mL | INTRAVENOUS | Status: DC | PRN

## 2019-04-19 MED ORDER — NITROGLYCERIN 0.4 MG SL SUBL: 0.4000 mg | SUBLINGUAL_TABLET | SUBLINGUAL | Status: DC | PRN

## 2019-04-19 MED ORDER — ACETAMINOPHEN 325 MG PO TABS: 650.0000 mg | ORAL_TABLET | ORAL | Status: DC | PRN

## 2019-04-19 MED ORDER — METOPROLOL TARTRATE 12.5 MG HALF TABLET
12.5000 mg | ORAL_TABLET | Freq: Two times a day (BID) | ORAL | Status: DC
  Administered 2019-04-19 – 2019-04-20 (×2): 12.5 mg via ORAL
  Filled 2019-04-19 (×2): qty 1

## 2019-04-19 MED ORDER — PROCHLORPERAZINE EDISYLATE 10 MG/2ML IJ SOLN
10.0000 mg | Freq: Once | INTRAMUSCULAR | Status: AC
  Administered 2019-04-19: 10 mg via INTRAVENOUS
  Filled 2019-04-19: qty 2

## 2019-04-19 MED ORDER — FLUTICASONE PROPIONATE 50 MCG/ACT NA SUSP
1.0000 | Freq: Every day | NASAL | Status: DC
  Administered 2019-04-20: 1 via NASAL
  Filled 2019-04-19: qty 16

## 2019-04-19 MED ORDER — METOPROLOL TARTRATE 25 MG PO TABS
100.0000 mg | ORAL_TABLET | Freq: Once | ORAL | Status: AC
  Administered 2019-04-20: 100 mg via ORAL
  Filled 2019-04-19: qty 4

## 2019-04-19 MED ORDER — HYDROCHLOROTHIAZIDE 10 MG/ML ORAL SUSPENSION
6.2500 mg | Freq: Every day | ORAL | Status: DC
  Filled 2019-04-19: qty 1.25

## 2019-04-19 MED ORDER — DIPHENHYDRAMINE HCL 50 MG/ML IJ SOLN
25.0000 mg | Freq: Once | INTRAMUSCULAR | Status: AC
  Administered 2019-04-19: 25 mg via INTRAVENOUS
  Filled 2019-04-19: qty 1

## 2019-04-19 MED ORDER — HEPARIN BOLUS VIA INFUSION
4000.0000 [IU] | Freq: Once | INTRAVENOUS | Status: AC
  Administered 2019-04-19: 4000 [IU] via INTRAVENOUS
  Filled 2019-04-19: qty 4000

## 2019-04-19 MED ORDER — MORPHINE SULFATE (PF) 2 MG/ML IV SOLN: 2.0000 mg | INTRAVENOUS | Status: DC | PRN

## 2019-04-19 MED ORDER — LISINOPRIL 10 MG PO TABS: 10.0000 mg | ORAL_TABLET | Freq: Every day | ORAL | Status: DC

## 2019-04-19 MED ORDER — LISINOPRIL-HYDROCHLOROTHIAZIDE 20-12.5 MG PO TABS: 0.5000 | ORAL_TABLET | Freq: Every day | ORAL | Status: DC

## 2019-04-19 MED ORDER — ASPIRIN EC 81 MG PO TBEC
81.0000 mg | DELAYED_RELEASE_TABLET | Freq: Every day | ORAL | Status: DC
  Administered 2019-04-20: 81 mg via ORAL
  Filled 2019-04-19: qty 1

## 2019-04-19 MED ORDER — SODIUM CHLORIDE 0.9 % IV BOLUS
500.0000 mL | Freq: Once | INTRAVENOUS | Status: AC
  Administered 2019-04-19: 500 mL via INTRAVENOUS

## 2019-04-19 NOTE — Telephone Encounter (Signed)
Patient called and says he's been having chest pain, palpitations and SOB off and on, but it's getting worse, since Saturday. He says he has CP from time to time, but now it's affecting him when he's working in the yard. He says his chest feels tight all across the chest and into his shoulders. He says he has pain up to his neck and into his head. The pain he says is a dull pain at a 4. He says he has a cough as well. I advised to go to the ED for evaluation, patient verbalized understanding.  Reason for Disposition . Difficulty breathing  Answer Assessment - Initial Assessment Questions 1. LOCATION: "Where does it hurt?"       Dull pressure across chest, tightness 2. RADIATION: "Does the pain go anywhere else?" (e.g., into neck, jaw, arms, back)     Into my shoulders at times 3. ONSET: "When did the chest pain begin?" (Minutes, hours or days)      Saturday off and on when working outside 4. PATTERN "Does the pain come and go, or has it been constant since it started?"  "Does it get worse with exertion?"      Come and go, but chest is tight after going outside 5. DURATION: "How long does it last" (e.g., seconds, minutes, hours)     Sometimes a little and go away and sometimes every day all day 6. SEVERITY: "How bad is the pain?"  (e.g., Scale 1-10; mild, moderate, or severe)    - MILD (1-3): doesn't interfere with normal activities     - MODERATE (4-7): interferes with normal activities or awakens from sleep    - SEVERE (8-10): excruciating pain, unable to do any normal activities       Sitting down a 4 right now 7. CARDIAC RISK FACTORS: "Do you have any history of heart problems or risk factors for heart disease?" (e.g., prior heart attack, angina; high blood pressure, diabetes, being overweight, high cholesterol, smoking, or strong family history of heart disease)     Family history, high blood pressure 8. PULMONARY RISK FACTORS: "Do you have any history of lung disease?"  (e.g., blood clots  in lung, asthma, emphysema, birth control pills)    No 9. CAUSE: "What do you think is causing the chest pain?"     I don't know 10. OTHER SYMPTOMS: "Do you have any other symptoms?" (e.g., dizziness, nausea, vomiting, sweating, fever, difficulty breathing, cough)      SOB, headache, neck ache, vision a little fuzzy, cough 11. PREGNANCY: "Is there any chance you are pregnant?" "When was your last menstrual period?"      N/A  Protocols used: CHEST PAIN-A-AH

## 2019-04-19 NOTE — ED Provider Notes (Signed)
[facility] Provider Note   CSN: [id number] Arrival date & time: 04/19/19  1246    History   Chief Complaint Chief Complaint  Patient presents with   Chest Pain   Shortness of Breath   Headache    HPI Dustin [name] is a 58 y.o. male.     Dustin [name] is a 58 y.o. male with a history of hypertension, orthostatic dizziness, and GERD, who presents to the emergency department for evaluation of chest pain, shortness of breath and headache.  He reports that since Saturday he has been having increasing episodes of chest tightness which occur primarily with exertion.  The first occurred when he was out working in his yard on Saturday.  He reports that he was walking around his yard with heavy equipment when he began to have severe chest tightness that radiates across the left side of the chest, at this time he started to feel short of breath and also developed a posterior headache with some blurred vision.  He reports that the symptoms subsided on their own, throughout the week he has had some intermittent episodes of chest tightness.  He reports that at baseline he has palpitations thought to be PVCs that intermittently cause some chest tightness but he does not usually have episodes this frequent and they are not usually associated with exertion, he also does not typically have associated headaches.  He had another severe episode today while he was walking his dog outdoors where he had onset of severe chest tightness on the left side, nonradiating with associated shortness of breath and once again developed posterior headache with some blurred vision, he reports chest tightness has eased off, he still complains of a headache but reports his vision is normal.  He has had increasing palpitations throughout the week, he reports it frequently feels like his heart skips a beat and he can feel at every few beats sometimes and then it will ease off and then return.   He has not had any lightheadedness or syncope.  He denies any lower extremity edema or swelling.  No associated abdominal pain, nausea vomiting or diarrhea.  No diaphoresis.  No fevers or cough.  He reports that he was seen for chest pain a few years ago and had a nuclear stress test which looked reassuring but he has not seen a cardiologist, no prior history of CAD personally but his sister had a heart attack at similar age as well as his father.  Hypertension is primary risk factor.  Denies smoking history.  Does have the occasional beer, denies any other substance use.     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 Authorizing Provider  acetaminophen (TYLENOL) 500 MG tablet Take 1,000 mg by mouth every 6 (six) hours as needed for headache (pain).    [provider]  aspirin EC 81 MG tablet Take  81 mg by mouth daily as needed (chest pain).    [provider]  fluticasone (FLONASE) 50 MCG/ACT nasal spray PLACE 1 SPRAY INTO BOTH NOSTRILS DAILY. 02/02/18   Billie Ruddy, MD  lisinopril-hydrochlorothiazide (PRINZIDE,ZESTORETIC) 20-12.5 MG tablet Take 0.5 tablets by mouth daily. 01/10/19   Billie Ruddy, MD  Multiple Vitamin (MULTIVITAMIN WITH MINERALS) TABS tablet Take 1 tablet by mouth daily.    [provider]    Family History Family History  Problem Relation Age of Onset   Hypertension Mother    Cancer Sister        melanoma    Heart disease Sister        heart attack 11/2010 - stents placed   Colon cancer Neg Hx    Colon polyps Neg Hx    Rectal cancer Neg Hx    Stomach cancer Neg Hx     Social History Social History   Tobacco Use   Smoking status: Never Smoker   Smokeless tobacco: Never Used  Substance Use Topics   Alcohol use: Yes    Comment: occ beer   Drug use: No     Allergies   Other   Review of Systems Review of Systems  Constitutional: Negative for chills and fever.  HENT: Negative.   Eyes: Positive for visual disturbance.  Respiratory: Positive for chest tightness and shortness of breath. Negative for cough.   Cardiovascular: Positive for chest pain and palpitations. Negative for leg swelling.  Gastrointestinal: Negative for abdominal pain, constipation, diarrhea, nausea and vomiting.  Musculoskeletal: Negative for arthralgias and myalgias.  Skin: Negative for color change and rash.  Neurological: Positive for headaches. Negative for dizziness, syncope, speech difficulty, weakness, light-headedness and numbness.  All other systems reviewed and are negative.    Physical Exam Updated Vital Signs BP 133/83 (BP Location: Right Arm)    Pulse 99    Temp 98.6 F (37 C) (Oral)    Resp 19    SpO2 97%   Physical Exam Vitals signs and nursing note reviewed.  Constitutional:      General: He is not in acute distress.    Appearance: He is well-developed and normal weight. He is not ill-appearing or diaphoretic.  HENT:     Head: Normocephalic and atraumatic.  Eyes:     General:        Right eye: No discharge.        Left eye: No discharge.     Pupils: Pupils are equal, round, and reactive to light.  Neck:     Musculoskeletal: Neck supple.     Vascular: No JVD.  Cardiovascular:     Rate and Rhythm: Normal rate and regular rhythm.     Pulses:          Radial pulses are 2+ on the right side and 2+ on the left side.       Dorsalis pedis pulses are 2+ on the right side and 2+  on the left side.     Heart sounds: Normal heart sounds. No murmur. No friction rub. No gallop.      Comments: Normal sinus rhythm with multiple PVCs. Pulmonary:     Effort: Pulmonary effort is normal. No respiratory distress.     Breath sounds: Normal breath sounds. No wheezing or rales.     Comments: Respirations equal and unlabored, patient able to speak in full sentences, lungs clear to auscultation bilaterally Abdominal:     General: Bowel sounds are normal. There is  no distension.     Palpations: Abdomen is soft. There is no mass.     Tenderness: There is no abdominal tenderness. There is no guarding.  Musculoskeletal:        General: No deformity.     Right lower leg: He exhibits no tenderness. No edema.     Left lower leg: He exhibits no tenderness. No edema.     Comments: Bilateral lower extremities warm and well perfused without edema.  Skin:    General: Skin is warm and dry.     Capillary Refill: Capillary refill takes less than 2 seconds.  Neurological:     General: No focal deficit present.     Mental Status: He is alert and oriented to person, place, and time.     Coordination: Coordination normal.     Comments: Speech is clear, able to follow commands CN III-XII intact Normal strength in upper and lower extremities bilaterally including dorsiflexion and plantar flexion, strong and equal grip strength Sensation normal to light and sharp touch Moves extremities without ataxia, coordination intact  Psychiatric:        Mood and Affect: Mood normal.        Behavior: Behavior normal.      ED Treatments / Results  Labs (all labs ordered are listed, but only abnormal results are displayed) Labs Reviewed  COMPREHENSIVE METABOLIC PANEL - Abnormal; Notable for the following components:      Result Value   Glucose, Bld 110 (*)    Alkaline Phosphatase 37 (*)    All other components within normal limits  SARS CORONAVIRUS 2 (HOSPITAL ORDER, PERFORMED IN [place]  [facility])  CBC WITH DIFFERENTIAL/PLATELET  TROPONIN I  MAGNESIUM    EKG EKG Interpretation  Date/Time:  Thursday April 19 2019 12:58:08 EDT Ventricular Rate:  96 PR Interval:    QRS Duration: 79 QT Interval:  340 QTC Calculation: 430 R Axis:   56 Text Interpretation:  Sinus rhythm Abnormal R-wave progression, early transition Baseline wander in lead(s) V1 Confirmed by Virgina Norfolk [phone]) on 04/19/2019 1:20:32 PM   Radiology Ct Head Wo Contrast  Result Date: 04/19/2019 CLINICAL DATA:  Blurred vision, headache. EXAM: CT HEAD WITHOUT CONTRAST TECHNIQUE: Contiguous axial images were obtained from the base of the skull through the vertex without intravenous contrast. COMPARISON:  None. FINDINGS: Brain: No evidence of acute infarction, hemorrhage, hydrocephalus, extra-axial collection or mass lesion/mass effect. Vascular: No hyperdense vessel or unexpected calcification. Skull: Normal. Negative for fracture or focal lesion. Sinuses/Orbits: No acute finding. Other: None. IMPRESSION: Normal head CT. Electronically Signed   By: Lupita Raider M.D.   On: 04/19/2019 15:21   Dg Chest Port 1 View  Result Date: 04/19/2019 CLINICAL DATA:  BILATERAL chest pain and shortness of breath. EXAM: PORTABLE CHEST 1 VIEW COMPARISON:  01/05/2018. FINDINGS: The heart size and mediastinal contours are within normal limits. Both lungs are clear. The visualized skeletal structures are unremarkable. IMPRESSION: No active disease. Electronically Signed   By: Elsie Stain M.D.   On: 04/19/2019 13:32   Ct Angio Chest/abd/pel For Dissection W And/or Wo Contrast  Result Date: 04/19/2019 CLINICAL DATA:  Chest pain on Saturday. Short of breath and blurred vision. Recurrent chest pain and headache. Shoulder blade pain. Concern for aortic dissection EXAM: CT ANGIOGRAPHY CHEST, ABDOMEN AND PELVIS TECHNIQUE: Multidetector CT imaging through the chest, abdomen and pelvis was performed using the standard protocol during  bolus administration of intravenous contrast. Multiplanar reconstructed images and MIPs were obtained  and reviewed to evaluate the vascular anatomy. CONTRAST:  100mL OMNIPAQUE IOHEXOL 350 MG/ML SOLN COMPARISON:  CT abdomen 01/29/2015 FINDINGS: CTA CHEST FINDINGS Cardiovascular: Noncontrast imaging demonstrates no intramural hematoma within the thoracic aorta. Contrast enhanced imaging demonstrates no dissection or aneurysm of the thoracic aorta. Great vessels are normal. No pericardial fluid. Heart appears normal. No central pulmonary embolism. Mediastinum/Nodes: No axillary or supraclavicular adenopathy. No mediastinal hilar adenopathy. Esophagus normal. Lungs/Pleura: No suspicious pulmonary nodules. No pulmonary infarction. No infiltrate. No pleural fluid. No pneumothorax. Musculoskeletal: No acute osseous findings. Review of the MIP images confirms the above findings. CTA ABDOMEN AND PELVIS FINDINGS VASCULAR Aorta: Abdominal aorta is normal in caliber. No aortic dissection or aneurysm. Celiac: Widely patent SMA: Widely patent Renals: Single patent renal arteries IMA: Widely patent Inflow: Normal Veins: Poorly opacified but normal Review of the MIP images confirms the above findings. NON-VASCULAR Hepatobiliary: Simple cysts in LEFT hepatic lobe. No biliary duct dilatation. Postcholecystectomy Pancreas: .  No pancreatic inflammation or duct dilatation. Spleen: Normal spleen Adrenals/Urinary Tract: Adrenal glands, kidneys, ureters and bladder normal. Stomach/Bowel: Stomach, small-bowel, appendix cecum normal. The colon and rectosigmoid colon are normal. Lymphatic: No retroperitoneal periportal adenopathy. No pelvic adenopathy. Reproductive: Prostate normal Other: No free fluid.  Hernia. Musculoskeletal: No acute osseous abnormality. Review of the MIP images confirms the above findings. IMPRESSION: Chest Impression: 1. Normal thoracic aorta.  No dissection or aneurysm. 2. No acute pulmonary parenchymal findings.  Abdomen / Pelvis Impression: 1. No aortic dissection or aneurysm. 2. No acute findings in the abdomen or pelvis. 3. No acute musculoskeletal abnormality identified. Electronically Signed   By: Genevive BiStewart  Edmunds M.D.   On: 04/19/2019 15:45    Procedures Procedures (including critical care time)  Medications Ordered in ED Medications  iohexol (OMNIPAQUE) 350 MG/ML injection 100 mL (has no administration in time range)  sodium chloride 0.9 % bolus 500 mL (500 mLs Intravenous New Bag/Given 04/19/19 1359)  prochlorperazine (COMPAZINE) injection 10 mg (10 mg Intravenous Given 04/19/19 1356)  diphenhydrAMINE (BENADRYL) injection 25 mg (25 mg Intravenous Given 04/19/19 1356)     Initial Impression / Assessment and Plan / ED Course  I have reviewed the triage vital signs and the nursing notes.  Pertinent labs & imaging results that were available during my care of the patient were reviewed by me and considered in my medical decision making (see chart for details).  Patient presents with intermittent episodes of chest pain and shortness of breath associated with posterior headache.  First episode started on Saturday when patient was out working in the yard and he had additional severe episode today.  On arrival he is continuing complain of headache but is chest pain-free.  He has normal vitals.  He has had any associated fevers or cough.  Does have a history of palpitations with intermittent chest tightness, but these chest pain episodes seem different than his typical and he never has associated headache, chest pain with neurologic symptoms raises some concern for aortic dissection patient does not have any differential pulses on exam but will proceed with dissection study as well as work-up for ACS.  Dissection study will also assess for any possible PE, this seems unlikely given presentation.  EKG shows sinus rhythm with abnormal R wave progression but no obvious ischemic changes.  Initial troponin is  negative.  No leukocytosis, normal hemoglobin and no acute electrolyte derangements, normal renal and liver function.  Magnesium is normal as well.  Chest x-ray without acute cardiopulmonary disease.  CT of  the head shows no acute abnormalities and CT dissection study shows no evidence of dissection, PE or other acute abnormalities.  Despite reassuring work-up patient's episodes of chest pain seem to be brought on by exertion and are different than pain he is experienced in the past feel he would benefit from admission for chest pain work-up, most recent chest pain work-up and stress test was over 3 years ago.  Case discussed with Dr. Ophelia CharterYates with [facility] who will see and admit the patient.  Final Clinical Impressions(s) / ED Diagnoses   Final diagnoses:  Exertional chest pain  Acute nonintractable headache, unspecified headache type  Shortness of breath    ED Discharge Orders    None       Legrand RamsFord, Verdie Barrows N, PA-C 04/19/19 2145    Virgina Norfolkuratolo, Adam, DO 04/23/19 0703

## 2019-04-19 NOTE — ED Provider Notes (Signed)
Medical screening examination/treatment/procedure(s) were conducted as a shared visit with non-physician practitioner(s) and myself.  I personally evaluated the patient during the encounter. Briefly, the patient is a 58 y.o. male with history of hypertension who presents to the ED with chest pain, shortness of breath, headache.  Patient states that he has episodes in the past of chest pain, palpitations and headaches but over the last several days has noticed more exertional chest pain.  Has had left-sided headache on and off during this time as well.  Patient had a stress test several years ago that was unremarkable.  Patient has a significant family history of CAD with sibling and father both having heart attacks before the age of 73.  Patient states that is not typical for him to have chest pain with normal activities which he started to have this past week.  He does not have any severe chest pain now or shortness of breath.  Has a mild left-sided headache.  Patient has EKG that shows sinus rhythm.  Concerning with neurological symptoms and chest pain will get a dissection study.  Chest x-ray has already been done and is unremarkable.  Will evaluate with troponins, basic labs.  Believe patient would benefit from admission for further ACS care if imaging and lab work is unremarkable given his high heart score.  Patient with normal troponin.  CT of the head, chest, abdomen, pelvis shows no acute findings.  No dissection.  No head bleed.  Patient to be admitted to medicine for further cardiac work-up.  Hemodynamically stable throughout my care.  This chart was dictated using voice recognition software.  Despite best efforts to proofread,  errors can occur which can change the documentation meaning.    EKG Interpretation  Date/Time:  Thursday April 19 2019 12:58:08 EDT Ventricular Rate:  96 PR Interval:    QRS Duration: 79 QT Interval:  340 QTC Calculation: 430 R Axis:   56 Text Interpretation:  Sinus  rhythm Abnormal R-wave progression, early transition Baseline wander in lead(s) V1 Confirmed by Lennice Sites [phone]) on 04/19/2019 1:20:32 PM           Lennice Sites, DO 04/19/19 1638

## 2019-04-19 NOTE — H&P (Signed)
History and Physical    Dustin SicBrian K Dave LKG:[id number] DOB: 02-Mar-1961 DOA: 04/19/2019  PCP: Deeann SaintBanks, Shannon R, MD Consultants:  None Patient coming from:  Home - lives with wife; NOK: Wife, [phone]  Chief Complaint: chest pain  HPI: Dustin [name] is a 58 y.o. male with medical history significant of HTN presenting with chest pain.  He has had periodic chest pains in the past, generally with more frequent palpitations.  He has never been SOB with it.  Saturday, he was working outside in the heat and came into the house SOB and with tightness all the way across his chest.  Sunday, he worked outside in the yard and felt ok.  Dull pain was still present through Monday.  He didn't feel that well yesterday.  His wife was concerned about dehydration.  This AM, he went outside for 5-10 minutes.  His chest tightened and he was SOB; he was shoveling dog poop.  It improves with sitting down.  He had a normal Myoview in 10/17.  He is not having any discomfort now.   ED Course:  Multiple episodes of left-sided CP with exertion since Saturday.  Has h/o palpitations, occasionally with chest tightness.  Increasing palpitations but severe episode today with posterior headache with blurry vision (new). Has never seen cards.  Had nuc med stress test 3 years ago.  He had a dissection study and CT head - negative.    Review of Systems: As per HPI; otherwise review of systems reviewed and negative.   Ambulatory Status:  Ambulates without assistance  Past Medical History:  Diagnosis Date  . GERD (gastroesophageal reflux disease)    occasionally with no meds  . Hypertension   . 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 CHOLECYSTECTOMY  2003  . UMBILICAL HERNIA REPAIR  2003  . WISDOM TOOTH EXTRACTION      Social History   Socioeconomic History  . Marital status: Married    Spouse name: Not on file  . Number of children: Not on file  . Years of  education: Not on file  . Highest education level: Not on file  Occupational History  . Occupation: Education officer, communitycoonstruction superintendent  Social Needs  . Financial resource strain: Not on file  . Food insecurity    Worry: Not on file    Inability: Not on file  . Transportation needs    Medical: Not on file    Non-medical: Not on file  Tobacco Use  . Smoking status: Never Smoker  . Smokeless tobacco: Never Used  Substance and Sexual Activity  . Alcohol use: Yes    Comment: occ beer  . Drug use: No  . Sexual activity: Yes  Lifestyle  . Physical activity    Days per week: Not on file    Minutes per session: Not on file  . Stress: Not on file  Relationships  . Social Musicianconnections    Talks on phone: Not on file    Gets together: Not on file    Attends religious service: Not on file    Active member of club or organization: Not on file    Attends meetings of clubs or organizations: Not on file    Relationship status: Not on file  . Intimate partner violence    Fear of current or ex partner: Not on file    Emotionally abused: Not on file    Physically abused: Not on file    Forced  sexual activity: Not on file  Other Topics Concern  . Not on file  Social History Narrative  . Not on file    Allergies  Allergen Reactions  . Other Swelling    Reaction to novocaine at 58 years old (dentist's office)    Family History  Problem Relation Age of Onset  . Hypertension Mother   . Cancer Sister        melanoma  . CAD Father        CABG at age 60-60  . Heart disease Sister        heart attack 11/2010 - stents placed at age 60-60  . Colon cancer Neg Hx   . Colon polyps Neg Hx   . Rectal cancer Neg Hx   . Stomach cancer Neg Hx     Prior to Admission medications   Medication Sig Start Date End Date Taking? Authorizing Provider  acetaminophen (TYLENOL) 500 MG tablet Take 1,000 mg by mouth every 6 (six) hours as needed for headache (pain).    [provider]  aspirin EC 81  MG tablet Take 81 mg by mouth daily as needed (chest pain).    [provider]  fluticasone (FLONASE) 50 MCG/ACT nasal spray PLACE 1 SPRAY INTO BOTH NOSTRILS DAILY. 02/02/18   Deeann SaintBanks, Shannon R, MD  lisinopril-hydrochlorothiazide (PRINZIDE,ZESTORETIC) 20-12.5 MG tablet Take 0.5 tablets by mouth daily. 01/10/19   Deeann SaintBanks, Shannon R, MD  Multiple Vitamin (MULTIVITAMIN WITH MINERALS) TABS tablet Take 1 tablet by mouth daily.    [provider]    Physical Exam: Vitals:   04/19/19 1430 04/19/19 1445 04/19/19 1600 04/19/19 1615  BP: 103/68 104/63 (!) 141/76 107/74  Pulse: 82 70 97 73  Resp: 15 14 20 17   Temp:      TempSrc:      SpO2: 95% 95% 99% 95%     . General:  Appears calm and comfortable and is NAD . Eyes:  PERRL, EOMI, normal lids, iris . ENT:  grossly normal hearing, lips & tongue, mmm; appropriate dentition . Neck:  no LAD, masses or thyromegaly . Cardiovascular:  RRR with frequent PVCs, no m/r/g. No LE edema.  Marland Kitchen. Respiratory:   CTA bilaterally with no wheezes/rales/rhonchi.  Normal respiratory effort. . Abdomen:  soft, NT, ND, NABS . Back:   normal alignment, no CVAT . Skin:  no rash or induration seen on limited exam . Musculoskeletal:  grossly normal tone BUE/BLE, good ROM, no bony abnormality . Psychiatric:  grossly normal mood and affect, speech fluent and appropriate, AOx3 . Neurologic:  CN 2-12 grossly intact, moves all extremities in coordinated fashion, sensation intact    Radiological Exams on Admission: Ct Head Wo Contrast  Result Date: 04/19/2019 CLINICAL DATA:  Blurred vision, headache. EXAM: CT HEAD WITHOUT CONTRAST TECHNIQUE: Contiguous axial images were obtained from the base of the skull through the vertex without intravenous contrast. COMPARISON:  None. FINDINGS: Brain: No evidence of acute infarction, hemorrhage, hydrocephalus, extra-axial collection or mass lesion/mass effect. Vascular: No hyperdense vessel or unexpected calcification. Skull:  Normal. Negative for fracture or focal lesion. Sinuses/Orbits: No acute finding. Other: None. IMPRESSION: Normal head CT. Electronically Signed   By: Lupita RaiderJames  Green Jr M.D.   On: 04/19/2019 15:21   Dg Chest Port 1 View  Result Date: 04/19/2019 CLINICAL DATA:  BILATERAL chest pain and shortness of breath. EXAM: PORTABLE CHEST 1 VIEW COMPARISON:  01/05/2018. FINDINGS: The heart size and mediastinal contours are within normal limits. Both lungs are clear. The  visualized skeletal structures are unremarkable. IMPRESSION: No active disease. Electronically Signed   By: Staci Righter M.D.   On: 04/19/2019 13:32   Ct Angio Chest/abd/pel For Dissection W And/or Wo Contrast  Result Date: 04/19/2019 CLINICAL DATA:  Chest pain on Saturday. Short of breath and blurred vision. Recurrent chest pain and headache. Shoulder blade pain. Concern for aortic dissection EXAM: CT ANGIOGRAPHY CHEST, ABDOMEN AND PELVIS TECHNIQUE: Multidetector CT imaging through the chest, abdomen and pelvis was performed using the standard protocol during bolus administration of intravenous contrast. Multiplanar reconstructed images and MIPs were obtained and reviewed to evaluate the vascular anatomy. CONTRAST:  152mL OMNIPAQUE IOHEXOL 350 MG/ML SOLN COMPARISON:  CT abdomen 01/29/2015 FINDINGS: CTA CHEST FINDINGS Cardiovascular: Noncontrast imaging demonstrates no intramural hematoma within the thoracic aorta. Contrast enhanced imaging demonstrates no dissection or aneurysm of the thoracic aorta. Great vessels are normal. No pericardial fluid. Heart appears normal. No central pulmonary embolism. Mediastinum/Nodes: No axillary or supraclavicular adenopathy. No mediastinal hilar adenopathy. Esophagus normal. Lungs/Pleura: No suspicious pulmonary nodules. No pulmonary infarction. No infiltrate. No pleural fluid. No pneumothorax. Musculoskeletal: No acute osseous findings. Review of the MIP images confirms the above findings. CTA ABDOMEN AND PELVIS  FINDINGS VASCULAR Aorta: Abdominal aorta is normal in caliber. No aortic dissection or aneurysm. Celiac: Widely patent SMA: Widely patent Renals: Single patent renal arteries IMA: Widely patent Inflow: Normal Veins: Poorly opacified but normal Review of the MIP images confirms the above findings. NON-VASCULAR Hepatobiliary: Simple cysts in LEFT hepatic lobe. No biliary duct dilatation. Postcholecystectomy Pancreas: .  No pancreatic inflammation or duct dilatation. Spleen: Normal spleen Adrenals/Urinary Tract: Adrenal glands, kidneys, ureters and bladder normal. Stomach/Bowel: Stomach, small-bowel, appendix cecum normal. The colon and rectosigmoid colon are normal. Lymphatic: No retroperitoneal periportal adenopathy. No pelvic adenopathy. Reproductive: Prostate normal Other: No free fluid.  Hernia. Musculoskeletal: No acute osseous abnormality. Review of the MIP images confirms the above findings. IMPRESSION: Chest Impression: 1. Normal thoracic aorta.  No dissection or aneurysm. 2. No acute pulmonary parenchymal findings. Abdomen / Pelvis Impression: 1. No aortic dissection or aneurysm. 2. No acute findings in the abdomen or pelvis. 3. No acute musculoskeletal abnormality identified. Electronically Signed   By: Suzy Bouchard M.D.   On: 04/19/2019 15:45    EKG: Independently reviewed.  NSR with rate 96; nonspecific ST changes with no evidence of acute ischemia   Labs on Admission: I have personally reviewed the available labs and imaging studies at the time of the admission.  Pertinent labs:   Glucose 110 Troponin <0.03 Normal CBC   Assessment/Plan Principal Problem:   Unstable angina (HCC) Active Problems:   Essential hypertension   Impaired glucose tolerance   Unstable angina -Patient with substernal chest pain that came on gradually since Saturday, but more frequently.  Associated with exertion, relieved with rest -3/3 typical symptoms suggestive of typical cardiac chest pain.  -CXR  unremarkable.   -Initial troponin negative.   -EKG with no apparent STEMI, subtle nonspecific changes; frequent PVCs on tele. -TIMI risk score is 2; which predicts a 14 days risk of death, recurrent MI, or urgent revascularization of 8.3%.  -Will plan to admit to [facility] at [facility] on telemetry to further evaluate for ACS by overnight observation.  -Cardiology to consult. -Will start empiric Heparin drip -cycle troponin q6h x 3 and repeat EKG in AM -ASA 81 mg PO daily -morphine given -Start Lipitor 40 mg qhs for now -Risk factor stratification with FLP and HgbA1c; will also check TSH  HTN -Takes ACE/HCTZ at home -Will add low-dose metoprolol, as this might also help palpitations  HLD -LDL in 12/19 was 116 Empiric Lipitor 40 for now -Repeat lipid panel  Impaired glucose tolerance -Glucose 110 -Diet controlled -Last A1c was 5.6 in 12/19, will repeat -There is no indication to start medication at this time, but will follow with fasting glucose    Note: This patient has been tested and is pending for the novel coronavirus COVID-19.   DVT prophylaxis: Heparin drip Code Status:  Full - confirmed with patient/family Family Communication: None present; the patient, his wife, and I discussed the plan via telephone Disposition Plan:  Home once clinically improved Consults called: Cardiology  Admission status: It is my clinical opinion that referral for OBSERVATION is reasonable and necessary in this patient based on the above information provided. The aforementioned taken together are felt to place the patient at high risk for further clinical deterioration. However it is anticipated that the patient may be medically stable for discharge from the hospital within 24 to 48 hours.     Jonah BlueJennifer Naveya Ellerman MD [facility]   How to contact the [facility] Attending or Consulting provider 7A - 7P or covering provider during after hours 7P -7A, for this patient?  1. Check the care team in [facility] and  look for a) attending/consulting [facility] provider listed and b) the [facility] team listed 2. Log into [email or web address] and use [place]'s universal password to access. If you do not have the password, please contact the hospital operator. 3. Locate the [facility] provider you are looking for under [facility] and page to a number that you can be directly reached. 4. If you still have difficulty reaching the provider, please page the [facility] (Director on Call) for the Hospitalists listed on amion for assistance.   04/19/2019, 5:37 PM

## 2019-04-19 NOTE — Telephone Encounter (Signed)
Will monitor for ED arrival.  

## 2019-04-19 NOTE — Consult Note (Signed)
Cardiology Consultation:   Patient ID: Dustin [name]; [id number]; 05-14-1961   Admit date: 04/19/2019 Date of Consult: 04/19/2019  Primary Care Provider: Deeann [name], Dustin R, MD Primary Cardiologist: No primary care provider on file. New Primary Electrophysiologist:  None   Patient Profile:   Dustin [name] is a 58 y.o. male with a hx of HTN, strong FH premature CAD, nl MV 2017, who is being seen today for the evaluation of chest pain at the request of Dr Dustin [name].  History of Present Illness:   Mr. Dustin [name] had a MV in 2017 for chest pain.  He has had intermitted chest pain for > 5 years. The MV was done for this. He will get it 1-2 x month. No relation to exertion. No relation to meals or position. The pain is L or R or middle, sharp or dull or pressure. Can last <5" but may return multiple times in a day. 5/10 at its worst. No associated sx.  Occasional problems w/ reflux, those sx are pressure below his sternum, relieved by food. Similar to the chest pain at times.   6 days ago, he worked a couple of hours in the heat, felt chest tightness across his chest, 5/10. He had SOB and a HA as well, unusual with the chest pain. He went inside and drank Propel and rested. Sx tapered off, but lasted several hours. He took 2 baby ASA, no other meds. Sunday had 1/10 CP for 5". Had CP Weds, but it resolved.   Today, he was shoveling dog poop (4 [place]). Walking around outside and had chest pain, tightness across his chest with SOB and HA again. No rx tried.   Called PCP, sx concerned them so he came to ER as requested. In ER, he got benadryl and compazine for the HA, no other rx. Has not had ASA today. CP has resolved.    Past Medical History:  Diagnosis Date  . GERD (gastroesophageal reflux disease)    occasionally with no meds  . Hypertension   . 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 CHOLECYSTECTOMY  2003  . UMBILICAL HERNIA REPAIR   2003  . WISDOM TOOTH EXTRACTION       Prior to Admission medications   Medication Sig Start Date End Date Taking? Authorizing Provider  acetaminophen (TYLENOL) 500 MG tablet Take 1,000 mg by mouth every 6 (six) hours as needed for headache (pain).    [provider]  aspirin EC 81 MG tablet Take 81 mg by mouth daily as needed (chest pain).    [provider]  fluticasone (FLONASE) 50 MCG/ACT nasal spray PLACE 1 SPRAY INTO BOTH NOSTRILS DAILY. 02/02/18   Dustin [name], Dustin R, MD  lisinopril-hydrochlorothiazide (PRINZIDE,ZESTORETIC) 20-12.5 MG tablet Take 0.5 tablets by mouth daily. 01/10/19   Dustin [name], Dustin R, MD  Multiple Vitamin (MULTIVITAMIN WITH MINERALS) TABS tablet Take 1 tablet by mouth daily.    [provider]    Inpatient Medications: Scheduled Meds: . [START ON 04/20/2019] aspirin EC  81 mg Oral Daily  . atorvastatin  40 mg Oral q1800  . [START ON 04/20/2019] fluticasone  1 spray Each Nare Daily  . heparin  4,000 Units Intravenous Once  . lisinopril-hydrochlorothiazide  0.5 tablet Oral Daily  . metoprolol tartrate  12.5 mg Oral BID  . sodium chloride flush  3 mL Intravenous Q12H   Continuous Infusions: . sodium chloride    . heparin     PRN  Meds:   Allergies:    Allergies  Allergen Reactions  . Novocain [Procaine] Swelling    Social History:   Social History   Socioeconomic History  . Marital status: Married    Spouse name: Not on file  . Number of children: Not on file  . Years of education: Not on file  . Highest education level: Not on file  Occupational History  . Occupation: Theme park managerconstruction superintendent    Employer: [employer]  Social Needs  . Financial resource strain: Not on file  . Food insecurity    Worry: Not on file    Inability: Not on file  . Transportation needs    Medical: Not on file    Non-medical: Not on file  Tobacco Use  . Smoking status: Never Smoker  . Smokeless tobacco: Never Used  Substance and  Sexual Activity  . Alcohol use: Yes    Comment: occ beer  . Drug use: No  . Sexual activity: Yes  Lifestyle  . Physical activity    Days per week: Not on file    Minutes per session: Not on file  . Stress: Not on file  Relationships  . Social Musicianconnections    Talks on phone: Not on file    Gets together: Not on file    Attends religious service: Not on file    Active member of club or organization: Not on file    Attends meetings of clubs or organizations: Not on file    Relationship status: Not on file  . Intimate partner violence    Fear of current or ex partner: Not on file    Emotionally abused: Not on file    Physically abused: Not on file    Forced sexual activity: Not on file  Other Topics Concern  . Not on file  Social History Narrative   Lives in [place] with his wife.    Family History:   Family History  Problem Relation Age of Onset  . Hypertension Mother   . Cancer Sister        melanoma  . CAD Father        CABG at age 60-60  . Heart disease Sister        heart attack 11/2010 - stents placed at age 60-60  . Colon cancer Neg Hx   . Colon polyps Neg Hx   . Rectal cancer Neg Hx   . Stomach cancer Neg Hx    Family Status:  Family Status  Relation Name Status  . Mother  Alive  . Sister  Alive  . Father  Deceased at age 60  . Sister  Alive  . Sister  Alive  . Neg Hx  (Not Specified)    ROS:  Please see the history of present illness.  All other ROS reviewed and negative.     Physical Exam/Data:   Vitals:   04/19/19 1430 04/19/19 1445 04/19/19 1600 04/19/19 1615  BP: 103/68 104/63 (!) 141/76 107/74  Pulse: 82 70 97 73  Resp: 15 14 20 17   Temp:      TempSrc:      SpO2: 95% 95% 99% 95%    Intake/Output Summary (Last 24 hours) at 04/19/2019 1750 Last data filed at 04/19/2019 1558 Gross per 24 hour  Intake 500 ml  Output -  Net 500 ml   There were no vitals filed for this visit. There is no height or weight on file to calculate BMI.   General:  Well nourished, well developed, in no acute distress HEENT: normal Lymph: no adenopathy Neck: no JVD Endocrine:  No thryomegaly Vascular: No carotid bruits; 4/4 extremity pulses 2+, without bruits  Cardiac:  normal S1, S2; RRR; no murmur  Lungs:  clear to auscultation bilaterally, no wheezing, rhonchi or rales  Abd: soft, nontender, no hepatomegaly  Ext: no edema Musculoskeletal:  No deformities, BUE and BLE strength normal and equal Skin: warm and dry  Neuro:  CNs 2-12 intact, no focal abnormalities noted Psych:  Normal affect   EKG:  The EKG was personally reviewed and demonstrates:  SR, HR 96, no acute ischemic changes Telemetry:  Telemetry was personally reviewed and demonstrates:  SR  Relevant CV Studies:  MYOVIEW: 08/26/2016  Nuclear stress EF: 61%. Normal LV function  There was no ST segment deviation noted during stress.  The study is normal. No ischemia . No infarction  This is a low risk study.     Laboratory Data:  Chemistry Recent Labs  Lab 04/19/19 1355  NA 138  K 4.4  CL 104  CO2 25  GLUCOSE 110*  BUN 17  CREATININE 1.11  CALCIUM 9.4  GFRNONAA >60  GFRAA >60  ANIONGAP 9    Lab Results  Component Value Date   ALT 19 04/19/2019   AST 22 04/19/2019   ALKPHOS 37 (L) 04/19/2019   BILITOT 1.0 04/19/2019   Hematology Recent Labs  Lab 04/19/19 1355  WBC 7.0  RBC 4.69  HGB 14.5  HCT 42.4  MCV 90.4  MCH 30.9  MCHC 34.2  RDW 12.5  PLT 270   Cardiac Enzymes Recent Labs  Lab 04/19/19 1355  TROPONINI <0.03   No results for input(s): TROPIPOC in the last 168 hours.   TSH:  Lab Results  Component Value Date   TSH 1.46 10/11/2017   Lipids: Lab Results  Component Value Date   CHOL 188 10/12/2018   HDL 58.30 10/12/2018   LDLCALC 116 (H) 10/12/2018   TRIG 68.0 10/12/2018   CHOLHDL 3 10/12/2018   HgbA1c: Lab Results  Component Value Date   HGBA1C 5.6 10/12/2018   Magnesium:  Magnesium  Date Value Ref Range Status   04/19/2019 2.1 1.7 - 2.4 mg/dL Final    Comment:    Performed at [facility], [address] [place], [place] [address]     Radiology/Studies:  Ct Head Wo Contrast  Result Date: 04/19/2019 CLINICAL DATA:  Blurred vision, headache. EXAM: CT HEAD WITHOUT CONTRAST TECHNIQUE: Contiguous axial images were obtained from the base of the skull through the vertex without intravenous contrast. COMPARISON:  None. FINDINGS: Brain: No evidence of acute infarction, hemorrhage, hydrocephalus, extra-axial collection or mass lesion/mass effect. Vascular: No hyperdense vessel or unexpected calcification. Skull: Normal. Negative for fracture or focal lesion. Sinuses/Orbits: No acute finding. Other: None. IMPRESSION: Normal head CT. Electronically Signed   By: Lupita RaiderJames  Green Jr M.D.   On: 04/19/2019 15:21   Dg Chest Port 1 View  Result Date: 04/19/2019 CLINICAL DATA:  BILATERAL chest pain and shortness of breath. EXAM: PORTABLE CHEST 1 VIEW COMPARISON:  01/05/2018. FINDINGS: The heart size and mediastinal contours are within normal limits. Both lungs are clear. The visualized skeletal structures are unremarkable. IMPRESSION: No active disease. Electronically Signed   By: Elsie StainJohn T Curnes M.D.   On: 04/19/2019 13:32   Ct Angio Chest/abd/pel For Dissection W And/or Wo Contrast  Result Date: 04/19/2019 CLINICAL DATA:  Chest pain on Saturday. Short of breath and blurred vision.  Recurrent chest pain and headache. Shoulder blade pain. Concern for aortic dissection EXAM: CT ANGIOGRAPHY CHEST, ABDOMEN AND PELVIS TECHNIQUE: Multidetector CT imaging through the chest, abdomen and pelvis was performed using the standard protocol during bolus administration of intravenous contrast. Multiplanar reconstructed images and MIPs were obtained and reviewed to evaluate the vascular anatomy. CONTRAST:  192mL OMNIPAQUE IOHEXOL 350 MG/ML SOLN COMPARISON:  CT abdomen 01/29/2015 FINDINGS: CTA CHEST FINDINGS Cardiovascular: Noncontrast  imaging demonstrates no intramural hematoma within the thoracic aorta. Contrast enhanced imaging demonstrates no dissection or aneurysm of the thoracic aorta. Great vessels are normal. No pericardial fluid. Heart appears normal. No central pulmonary embolism. Mediastinum/Nodes: No axillary or supraclavicular adenopathy. No mediastinal hilar adenopathy. Esophagus normal. Lungs/Pleura: No suspicious pulmonary nodules. No pulmonary infarction. No infiltrate. No pleural fluid. No pneumothorax. Musculoskeletal: No acute osseous findings. Review of the MIP images confirms the above findings. CTA ABDOMEN AND PELVIS FINDINGS VASCULAR Aorta: Abdominal aorta is normal in caliber. No aortic dissection or aneurysm. Celiac: Widely patent SMA: Widely patent Renals: Single patent renal arteries IMA: Widely patent Inflow: Normal Veins: Poorly opacified but normal Review of the MIP images confirms the above findings. NON-VASCULAR Hepatobiliary: Simple cysts in LEFT hepatic lobe. No biliary duct dilatation. Postcholecystectomy Pancreas: .  No pancreatic inflammation or duct dilatation. Spleen: Normal spleen Adrenals/Urinary Tract: Adrenal glands, kidneys, ureters and bladder normal. Stomach/Bowel: Stomach, small-bowel, appendix cecum normal. The colon and rectosigmoid colon are normal. Lymphatic: No retroperitoneal periportal adenopathy. No pelvic adenopathy. Reproductive: Prostate normal Other: No free fluid.  Hernia. Musculoskeletal: No acute osseous abnormality. Review of the MIP images confirms the above findings. IMPRESSION: Chest Impression: 1. Normal thoracic aorta.  No dissection or aneurysm. 2. No acute pulmonary parenchymal findings. Abdomen / Pelvis Impression: 1. No aortic dissection or aneurysm. 2. No acute findings in the abdomen or pelvis. 3. No acute musculoskeletal abnormality identified. Electronically Signed   By: Suzy Bouchard M.D.   On: 04/19/2019 15:45    Assessment and Plan:   1. Chest pain - no  consistent exertional sx - BP generally well controlled, but has strong FH premature CAD (Father and sister dx w/ CAD at his current age) - MV 2017 was normal - sx today were prolonged, lasted several hours w/ no ECG changes and initial ez negative - However, last 2 episodes were associated w/ SOB and HA, unusual for him.  - both episodes started when he was out in the heat.  - feel definitive test needed, consider CTA chest but will have to lower HR  2. Possible Hyperlipidemia:  - if pt +CAD, goal LDL will be <70 and he will need to continue rx  Otherwise, per IM Principal Problem:   Unstable angina ([place]) Active Problems:   Essential hypertension   Impaired glucose tolerance   For questions or updates, please contact [facility] Please consult [email or web address] for contact info under Cardiology/STEMI.   SignedRosaria Ferries, PA-C  04/19/2019 5:50 PM

## 2019-04-19 NOTE — Progress Notes (Signed)
ANTICOAGULATION CONSULT NOTE - Initial Consult  Pharmacy Consult for heparin Indication: chest pain/ACS  Allergies  Allergen Reactions  . Other Swelling    Reaction to novocaine at 58 years old (dentist's office)    Patient Measurements: Last actual body weight: 84kg Ideal Body Weight: 79.9 kg Heparin Dosing Weight: 84 kg  Vital Signs: Temp: 98.6 F (37 C) (06/11 1307) Temp Source: Oral (06/11 1307) BP: 107/74 (06/11 1615) Pulse Rate: 73 (06/11 1615)  Labs: Recent Labs    04/19/19 1355  HGB 14.5  HCT 42.4  PLT 270  CREATININE 1.11  TROPONINI <0.03    CrCl cannot be calculated (Unknown ideal weight.).   Medical History: Past Medical History:  Diagnosis Date  . GERD (gastroesophageal reflux disease)    occasionally with no meds  . Hypertension   . 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 Presents with with chest pain, shortness of breath, headache.  Pharmacy consulted to dose heparin. No anticoagulants prior to admission. CBC stable with Hgb 14.5 and pltc 270.  Goal of Therapy:  Heparin level 0.3-0.7 units/ml Monitor platelets by anticoagulation protocol: Yes   Plan:  Heparin bolus 4000 units x1 now Start heparin infusion at 1000 units/hr  Heparin level at 0100 Monitor daily heparin level, CBC, s/sx of bleeding  Thank you for involving pharmacy in this patient's care.  Janae Bridgeman, PharmD PGY1 Pharmacy Resident Phone: [phone] 04/19/2019 5:37 PM

## 2019-04-19 NOTE — ED Triage Notes (Signed)
Pt co Chest pain that is really tight. Pt states that Saturday he experienced chest pain, SOB, & blurred vision and HA. This morning pt states he went outside an started walking and he experienced chest tightness and and bad headache as well as shoulder blade pain.

## 2019-04-19 NOTE — ED Notes (Signed)
ED TO INPATIENT HANDOFF REPORT  ED Nurse Name and Phone #: Everlene FarrierHilary Dannisha Eckmann, RN [phone]   S Name/Age/Gender Dustin [name] 58 y.o. male Room/Bed: 046C/046C  Code Status   Code Status: Full Code  Home/SNF/Other Home Patient oriented to: self, place, time and situation Is this baseline? Yes   Triage Complete: Triage complete  Chief Complaint Chest pain,sob,headache  Triage Note Pt co Chest pain that is really tight. Pt states that Saturday he experienced chest pain, SOB, & blurred vision and HA. This morning pt states he went outside an started walking and he experienced chest tightness and and bad headache as well as shoulder blade pain.    Allergies Allergies  Allergen Reactions  . Novocain [Procaine] Swelling    Level of Care/Admitting Diagnosis ED Disposition    ED Disposition Condition Comment   Admit  [facility]ospital [facility] [[id number]]  Level of Care: Progressive [102]  I expect the patient will be discharged within 24 hours: No (not a candidate for 5C-Observation unit)  Covid Evaluation: Screening Protocol (No Symptoms)  Diagnosis: Unstable angina [facility](HCC) [[id number]]) [[id number]]  Admitting Physician: Jonah BlueYATES, JENNIFER [[id number]]  Attending Physician: Jonah BlueYATES, JENNIFER [[id number]]  PT Class (Do Not Modify): Observation [104]  PT Acc Code (Do Not Modify): Observation [10022]       B Medical/Surgery History Past Medical History:  Diagnosis Date  . GERD (gastroesophageal reflux disease)    occasionally with no meds  . Hypertension   . 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   Past Surgical History:  Procedure Laterality Date  . CHOLECYSTECTOMY  2003  . UMBILICAL HERNIA REPAIR  2003  . WISDOM TOOTH EXTRACTION       A IV Location/Drains/Wounds Patient Lines/Drains/Airways Status   Active Line/Drains/Airways    Name:   Placement date:   Placement time:   Site:   Days:   Peripheral IV 04/19/19   04/19/19    1345    -   less than 1          Intake/Output Last 24  hours  Intake/Output Summary (Last 24 hours) at 04/19/2019 1751 Last data filed at 04/19/2019 1558 Gross per 24 hour  Intake 500 ml  Output -  Net 500 ml    Labs/Imaging Results for orders placed or performed during the hospital encounter of 04/19/19 (from the past 48 hour(s))  Comprehensive metabolic panel     Status: Abnormal   Collection Time: 04/19/19  1:55 PM  Result Value Ref Range   Sodium 138 135 - 145 mmol/L   Potassium 4.4 3.5 - 5.1 mmol/L   Chloride 104 98 - 111 mmol/L   CO2 25 22 - 32 mmol/L   Glucose, Bld 110 (H) 70 - 99 mg/dL   BUN 17 6 - 20 mg/dL   Creatinine, Ser 5.621.11 0.61 - 1.24 mg/dL   Calcium 9.4 8.9 - 13.010.3 mg/dL   Total Protein 7.4 6.5 - 8.1 g/dL   Albumin 4.2 3.5 - 5.0 g/dL   AST 22 15 - 41 U/L   ALT 19 0 - 44 U/L   Alkaline Phosphatase 37 (L) 38 - 126 U/L   Total Bilirubin 1.0 0.3 - 1.2 mg/dL   GFR calc non Af Amer >60 >60 mL/min   GFR calc Af Amer >60 >60 mL/min   Anion gap 9 5 - 15    Comment: Performed at [facility], [address] [place], [place] [address]  CBC with Differential     Status: None  Collection Time: 04/19/19  1:55 PM  Result Value Ref Range   WBC 7.0 4.0 - 10.5 K/uL   RBC 4.69 4.22 - 5.81 MIL/uL   Hemoglobin 14.5 13.0 - 17.0 g/dL   HCT 16.142.4 09.639.0 - 04.552.0 %   MCV 90.4 80.0 - 100.0 fL   MCH 30.9 26.0 - 34.0 pg   MCHC 34.2 30.0 - 36.0 g/dL   RDW 40.912.5 81.111.5 - 91.415.5 %   Platelets 270 150 - 400 K/uL   nRBC 0.0 0.0 - 0.2 %   Neutrophils Relative % 72 %   Neutro Abs 5.0 1.7 - 7.7 K/uL   Lymphocytes Relative 20 %   Lymphs Abs 1.4 0.7 - 4.0 K/uL   Monocytes Relative 7 %   Monocytes Absolute 0.5 0.1 - 1.0 K/uL   Eosinophils Relative 1 %   Eosinophils Absolute 0.0 0.0 - 0.5 K/uL   Basophils Relative 0 %   Basophils Absolute 0.0 0.0 - 0.1 K/uL   Immature Granulocytes 0 %   Abs Immature Granulocytes 0.03 0.00 - 0.07 K/uL    Comment: Performed at [facility], [address] [place], [place] [address]  Troponin I - ONCE -  STAT     Status: None   Collection Time: 04/19/19  1:55 PM  Result Value Ref Range   Troponin I <0.03 <0.03 ng/mL    Comment: Performed at [facility], [address] [place], [place] [address]  Magnesium     Status: None   Collection Time: 04/19/19  1:55 PM  Result Value Ref Range   Magnesium 2.1 1.7 - 2.4 mg/dL    Comment: Performed at [facility], [address] [place], [place] [address]   Ct Head Wo Contrast  Result Date: 04/19/2019 CLINICAL DATA:  Blurred vision, headache. EXAM: CT HEAD WITHOUT CONTRAST TECHNIQUE: Contiguous axial images were obtained from the base of the skull through the vertex without intravenous contrast. COMPARISON:  None. FINDINGS: Brain: No evidence of acute infarction, hemorrhage, hydrocephalus, extra-axial collection or mass lesion/mass effect. Vascular: No hyperdense vessel or unexpected calcification. Skull: Normal. Negative for fracture or focal lesion. Sinuses/Orbits: No acute finding. Other: None. IMPRESSION: Normal head CT. Electronically Signed   By: Lupita RaiderJames  Green Jr M.D.   On: 04/19/2019 15:21   Dg Chest Port 1 View  Result Date: 04/19/2019 CLINICAL DATA:  BILATERAL chest pain and shortness of breath. EXAM: PORTABLE CHEST 1 VIEW COMPARISON:  01/05/2018. FINDINGS: The heart size and mediastinal contours are within normal limits. Both lungs are clear. The visualized skeletal structures are unremarkable. IMPRESSION: No active disease. Electronically Signed   By: Elsie StainJohn T Curnes M.D.   On: 04/19/2019 13:32   Ct Angio Chest/abd/pel For Dissection W And/or Wo Contrast  Result Date: 04/19/2019 CLINICAL DATA:  Chest pain on Saturday. Short of breath and blurred vision. Recurrent chest pain and headache. Shoulder blade pain. Concern for aortic dissection EXAM: CT ANGIOGRAPHY CHEST, ABDOMEN AND PELVIS TECHNIQUE: Multidetector CT imaging through the chest, abdomen and pelvis was performed using the standard protocol during bolus administration of  intravenous contrast. Multiplanar reconstructed images and MIPs were obtained and reviewed to evaluate the vascular anatomy. CONTRAST:  100mL OMNIPAQUE IOHEXOL 350 MG/ML SOLN COMPARISON:  CT abdomen 01/29/2015 FINDINGS: CTA CHEST FINDINGS Cardiovascular: Noncontrast imaging demonstrates no intramural hematoma within the thoracic aorta. Contrast enhanced imaging demonstrates no dissection or aneurysm of the thoracic aorta. Great vessels are normal. No pericardial fluid. Heart appears normal. No central pulmonary embolism.  Mediastinum/Nodes: No axillary or supraclavicular adenopathy. No mediastinal hilar adenopathy. Esophagus normal. Lungs/Pleura: No suspicious pulmonary nodules. No pulmonary infarction. No infiltrate. No pleural fluid. No pneumothorax. Musculoskeletal: No acute osseous findings. Review of the MIP images confirms the above findings. CTA ABDOMEN AND PELVIS FINDINGS VASCULAR Aorta: Abdominal aorta is normal in caliber. No aortic dissection or aneurysm. Celiac: Widely patent SMA: Widely patent Renals: Single patent renal arteries IMA: Widely patent Inflow: Normal Veins: Poorly opacified but normal Review of the MIP images confirms the above findings. NON-VASCULAR Hepatobiliary: Simple cysts in LEFT hepatic lobe. No biliary duct dilatation. Postcholecystectomy Pancreas: .  No pancreatic inflammation or duct dilatation. Spleen: Normal spleen Adrenals/Urinary Tract: Adrenal glands, kidneys, ureters and bladder normal. Stomach/Bowel: Stomach, small-bowel, appendix cecum normal. The colon and rectosigmoid colon are normal. Lymphatic: No retroperitoneal periportal adenopathy. No pelvic adenopathy. Reproductive: Prostate normal Other: No free fluid.  Hernia. Musculoskeletal: No acute osseous abnormality. Review of the MIP images confirms the above findings. IMPRESSION: Chest Impression: 1. Normal thoracic aorta.  No dissection or aneurysm. 2. No acute pulmonary parenchymal findings. Abdomen / Pelvis Impression:  1. No aortic dissection or aneurysm. 2. No acute findings in the abdomen or pelvis. 3. No acute musculoskeletal abnormality identified. Electronically Signed   By: Genevive BiStewart  Edmunds M.D.   On: 04/19/2019 15:45    Pending Labs Unresulted Labs (From admission, onward)    Start     Ordered   04/21/19 0500  Heparin level (unfractionated)  Daily,   R     04/19/19 1742   04/20/19 0500  Basic metabolic panel  Tomorrow morning,   R     04/19/19 1729   04/20/19 0500  Lipid panel  Tomorrow morning,   R     04/19/19 1729   04/20/19 0500  CBC  Tomorrow morning,   R     04/19/19 1729   04/20/19 0500  Protime-INR  Tomorrow morning,   R     04/19/19 1729   04/20/19 0500  CBC  Daily,   R     04/19/19 1742   04/20/19 0100  Heparin level (unfractionated)  Once-Timed,   STAT     04/19/19 1742   04/19/19 2000  Troponin I - Now Then Q6H  Now then every 6 hours,   STAT     04/19/19 1729   04/19/19 1713  HIV antibody (Routine Testing)  Once,   STAT     04/19/19 1729   04/19/19 1713  TSH  Once,   STAT     04/19/19 1729   04/19/19 1713  Hemoglobin A1c  Once,   STAT     04/19/19 1729   04/19/19 1545  SARS Coronavirus 2  Once,   R     04/19/19 1545          Vitals/Pain Today's Vitals   04/19/19 1430 04/19/19 1445 04/19/19 1600 04/19/19 1615  BP: 103/68 104/63 (!) 141/76 107/74  Pulse: 82 70 97 73  Resp: 15 14 20 17   Temp:      TempSrc:      SpO2: 95% 95% 99% 95%  PainSc:        Isolation Precautions No active isolations  Medications Medications  aspirin EC tablet 81 mg (has no administration in time range)  lisinopril-hydrochlorothiazide (ZESTORETIC) 20-12.5 MG per tablet 0.5 tablet (has no administration in time range)  fluticasone (FLONASE) 50 MCG/ACT nasal spray 1 spray (has no administration in time range)  sodium chloride flush (NS) 0.9 % injection  3 mL (has no administration in time range)  sodium chloride flush (NS) 0.9 % injection 3 mL (has no administration in time range)  0.9 %   sodium chloride infusion (has no administration in time range)  nitroGLYCERIN (NITROSTAT) SL tablet 0.4 mg (has no administration in time range)  atorvastatin (LIPITOR) tablet 40 mg (has no administration in time range)  acetaminophen (TYLENOL) tablet 650 mg (has no administration in time range)  ondansetron (ZOFRAN) injection 4 mg (has no administration in time range)  metoprolol tartrate (LOPRESSOR) tablet 12.5 mg (has no administration in time range)  morphine 2 MG/ML injection 2 mg (has no administration in time range)  heparin bolus via infusion 4,000 Units (has no administration in time range)  heparin ADULT infusion 100 units/mL (25000 units/276mL sodium chloride 0.45%) (has no administration in time range)  sodium chloride 0.9 % bolus 500 mL (0 mLs Intravenous Stopped 04/19/19 1558)  prochlorperazine (COMPAZINE) injection 10 mg (10 mg Intravenous Given 04/19/19 1356)  diphenhydrAMINE (BENADRYL) injection 25 mg (25 mg Intravenous Given 04/19/19 1356)  iohexol (OMNIPAQUE) 350 MG/ML injection 100 mL (100 mLs Intravenous Contrast Given 04/19/19 1508)    Mobility walks Low fall risk   Focused Assessments Cardiac Assessment Handoff:    Lab Results  Component Value Date   TROPONINI <0.03 04/19/2019   No results found for: DDIMER Does the Patient currently have chest pain? No     R Recommendations: See Admitting Provider Note  Report given to:   Additional Notes: .

## 2019-04-20 ENCOUNTER — Observation Stay (HOSPITAL_COMMUNITY): Payer: BLUE CROSS/BLUE SHIELD

## 2019-04-20 DIAGNOSIS — R0602 Shortness of breath: Secondary | ICD-10-CM | POA: Diagnosis not present

## 2019-04-20 DIAGNOSIS — I10 Essential (primary) hypertension: Secondary | ICD-10-CM | POA: Diagnosis not present

## 2019-04-20 DIAGNOSIS — R079 Chest pain, unspecified: Secondary | ICD-10-CM | POA: Diagnosis not present

## 2019-04-20 DIAGNOSIS — I200 Unstable angina: Secondary | ICD-10-CM

## 2019-04-20 DIAGNOSIS — R51 Headache: Secondary | ICD-10-CM | POA: Diagnosis not present

## 2019-04-20 LAB — BASIC METABOLIC PANEL
Anion gap: 11 (ref 5–15)
BUN: 17 mg/dL (ref 6–20)
CO2: 22 mmol/L (ref 22–32)
Calcium: 9.2 mg/dL (ref 8.9–10.3)
Chloride: 106 mmol/L (ref 98–111)
Creatinine, Ser: 1.13 mg/dL (ref 0.61–1.24)
GFR calc Af Amer: >60 mL/min (ref >60)
GFR calc non Af Amer: >60 mL/min (ref >60)
Glucose, Bld: 95 mg/dL (ref 70–99)
Potassium: 4.1 mmol/L (ref 3.5–5.1)
Sodium: 139 mmol/L (ref 135–145)

## 2019-04-20 LAB — HIV ANTIBODY (ROUTINE TESTING W REFLEX): HIV Screen 4th Generation wRfx: NONREACTIVE

## 2019-04-20 LAB — LIPID PANEL
Cholesterol: 174 mg/dL (ref 0–200)
HDL: 55 mg/dL (ref >40)
LDL Cholesterol: 106 mg/dL — ABNORMAL HIGH (ref 0–99)
Total CHOL/HDL Ratio: 3.2 RATIO
Triglycerides: 67 mg/dL (ref <150)
VLDL: 13 mg/dL (ref 0–40)

## 2019-04-20 LAB — CBC
HCT: 41.4 % (ref 39.0–52.0)
Hemoglobin: 14.3 g/dL (ref 13.0–17.0)
MCH: 31 pg (ref 26.0–34.0)
MCHC: 34.5 g/dL (ref 30.0–36.0)
MCV: 89.6 fL (ref 80.0–100.0)
Platelets: 268 10*3/uL (ref 150–400)
RBC: 4.62 MIL/uL (ref 4.22–5.81)
RDW: 12.4 % (ref 11.5–15.5)
WBC: 8.5 10*3/uL (ref 4.0–10.5)
nRBC: 0 % (ref 0.0–0.2)

## 2019-04-20 LAB — HEPARIN LEVEL (UNFRACTIONATED)
Heparin Unfractionated: 0.32 IU/mL (ref 0.30–0.70)
Heparin Unfractionated: 0.43 IU/mL (ref 0.30–0.70)

## 2019-04-20 LAB — PROTIME-INR
INR: 1 (ref 0.8–1.2)
Prothrombin Time: 13.5 seconds (ref 11.4–15.2)

## 2019-04-20 LAB — TROPONIN I
Troponin I: <0.03 ng/mL (ref <0.03)
Troponin I: <0.03 ng/mL (ref <0.03)

## 2019-04-20 MED ORDER — ATORVASTATIN CALCIUM 20 MG PO TABS: 40.0000 mg | ORAL_TABLET | Freq: Every day | ORAL | 2 refills | Status: DC

## 2019-04-20 MED ORDER — NITROGLYCERIN 0.4 MG SL SUBL
SUBLINGUAL_TABLET | SUBLINGUAL | Status: AC
  Filled 2019-04-20: qty 2

## 2019-04-20 MED ORDER — IOHEXOL 350 MG/ML SOLN
100.0000 mL | Freq: Once | INTRAVENOUS | Status: AC | PRN
  Administered 2019-04-20: 100 mL via INTRAVENOUS

## 2019-04-20 MED ORDER — NITROGLYCERIN 0.4 MG SL SUBL
0.8000 mg | SUBLINGUAL_TABLET | Freq: Once | SUBLINGUAL | Status: AC
  Administered 2019-04-20: 0.8 mg via SUBLINGUAL

## 2019-04-20 MED ORDER — METOPROLOL TARTRATE 50 MG PO TABS
50.0000 mg | ORAL_TABLET | Freq: Once | ORAL | Status: AC
  Administered 2019-04-20: 50 mg via ORAL
  Filled 2019-04-20: qty 1

## 2019-04-20 NOTE — Progress Notes (Signed)
[facility] for heparin Indication: chest pain/ACS  Allergies  Allergen Reactions  . Novocain [Procaine] Swelling    Patient Measurements: Last actual body weight: 84kg Ideal Body Weight: 79.9 kg Heparin Dosing Weight: 84 kg  Vital Signs: Temp: 97.6 F (36.4 C) (06/12 0525) Temp Source: Oral (06/12 0525) BP: 126/79 (06/12 0820) Pulse Rate: 91 (06/12 0820)  Labs: Recent Labs    04/19/19 1355 04/19/19 1943 04/20/19 0425 04/20/19 1013  HGB 14.5  --  14.3  --   HCT 42.4  --  41.4  --   PLT 270  --  268  --   LABPROT  --   --  13.5  --   INR  --   --  1.0  --   HEPARINUNFRC  --   --  0.32 0.43  CREATININE 1.11  --  1.13  --   TROPONINI <0.03 <0.03 <0.03 <0.03    CrCl cannot be calculated (Unknown ideal weight.).  Assessment: 58yo male admitted with ACS. Presents with with chest pain, shortness of breath, headache.  Pharmacy consulted to dose heparin. No anticoagulants prior to admission.   Heparin level is therapeutic at 0.43 on 1100 units/hr. No bleeding noted, CBC stable.  Goal of Therapy:  Heparin level 0.3-0.7 units/ml Monitor platelets by anticoagulation protocol: Yes   Plan:  Continue heparin infusion at 1100 units/hr  Monitor daily heparin level, CBC, s/sx of bleeding F/U coronary CT results and LOT for heparin   Thank you for involving pharmacy in this patient's care.  Renold Genta, PharmD, BCPS Clinical Pharmacist 04/20/2019 12:19 PM  **Pharmacist phone directory can be found on [email or web address] listed under [place]**

## 2019-04-20 NOTE — Telephone Encounter (Signed)
Pt seen and evaluated at [facility] ED, admitted for observation.

## 2019-04-20 NOTE — Plan of Care (Signed)
Pt discharged well, in good health.

## 2019-04-20 NOTE — Progress Notes (Signed)
ANTICOAGULATION CONSULT NOTE - Follow Up Consult  Pharmacy Consult for heparin Indication: chest pain/ACS  Labs: Recent Labs    04/19/19 1355 04/19/19 1943 04/20/19 0425  HGB 14.5  --  14.3  HCT 42.4  --  41.4  PLT 270  --  268  LABPROT  --   --  13.5  INR  --   --  1.0  HEPARINUNFRC  --   --  0.32  CREATININE 1.11  --   --   TROPONINI <0.03 <0.03  --     Assessment: 58yo male therapeutic on heparin with initial dosing for CP though at very low end of goal; no gtt issues or signs of bleeding per RN.  Goal of Therapy:  Heparin level 0.3-0.7 units/ml   Plan:  Will increase heparin gtt by 1 unit/kg/hr to 1100 units/hr to prevent from dropping below goal and check level in 6 hours.    Wynona Neat, PharmD, BCPS  04/20/2019,5:18 AM

## 2019-04-20 NOTE — Progress Notes (Signed)
Progress Note  Patient Name: Dustin [name] Date of Encounter: 04/20/2019  Primary Cardiologist: Jodelle RedBridgette Zaya Kessenich, MD   Subjective   No acute events overnight. No chest pain. Awaiting coronary CT today. Reviewed with patient and his wife (via speakerphone).  Inpatient Medications    Scheduled Meds:  aspirin  324 mg Oral Once   aspirin EC  81 mg Oral Daily   atorvastatin  40 mg Oral q1800   fluticasone  1 spray Each Nare Daily   lisinopril  10 mg Oral Daily   And   hydrochlorothiazide  6.25 mg Oral Daily   metoprolol tartrate  100 mg Oral Once   metoprolol tartrate  12.5 mg Oral Once   metoprolol tartrate  12.5 mg Oral BID   sodium chloride flush  3 mL Intravenous Q12H   Continuous Infusions:  sodium chloride     heparin 1,100 Units/hr (04/20/19 0551)   PRN Meds: sodium chloride, acetaminophen, morphine injection, nitroGLYCERIN, ondansetron (ZOFRAN) IV, sodium chloride flush   Vital Signs    Vitals:   04/19/19 1851 04/19/19 2151 04/20/19 0525 04/20/19 0649  BP:  136/78 124/76   Pulse:  (!) 102 67   Resp:  18    Temp: 98.8 F (37.1 C) 98.1 F (36.7 C) 97.6 F (36.4 C)   TempSrc: Oral Oral Oral   SpO2: 97% 97% 94%   Weight:    82.6 kg    Intake/Output Summary (Last 24 hours) at 04/20/2019 0810 Last data filed at 04/20/2019 0000 Gross per 24 hour  Intake 575.64 ml  Output --  Net 575.64 ml   Last 3 Weights 04/20/2019 10/12/2018 09/06/2018  Weight (lbs) 182 lb 3.2 oz 185 lb 189 lb  Weight (kg) 82.645 kg 83.915 kg 85.73 kg      Telemetry    SR with one 6 beat run of SVT, intermittent PACs - Personally Reviewed  ECG    NSR - Personally Reviewed  Physical Exam   GEN: No acute distress.   Neck: No JVD Cardiac: RRR, no murmurs, rubs, or gallops.  Respiratory: Clear to auscultation bilaterally. GI: Soft, nontender, non-distended  MS: No edema; No deformity. Neuro:  Nonfocal  Psych: Normal affect   Labs    Chemistry Recent Labs   Lab 04/19/19 1355 04/20/19 0425  NA 138 139  K 4.4 4.1  CL 104 106  CO2 25 22  GLUCOSE 110* 95  BUN 17 17  CREATININE 1.11 1.13  CALCIUM 9.4 9.2  PROT 7.4  --   ALBUMIN 4.2  --   AST 22  --   ALT 19  --   ALKPHOS 37*  --   BILITOT 1.0  --   GFRNONAA >60 >60  GFRAA >60 >60  ANIONGAP 9 11     Hematology Recent Labs  Lab 04/19/19 1355 04/20/19 0425  WBC 7.0 8.5  RBC 4.69 4.62  HGB 14.5 14.3  HCT 42.4 41.4  MCV 90.4 89.6  MCH 30.9 31.0  MCHC 34.2 34.5  RDW 12.5 12.4  PLT 270 268    Cardiac Enzymes Recent Labs  Lab 04/19/19 1355 04/19/19 1943 04/20/19 0425  TROPONINI <0.03 <0.03 <0.03   No results for input(s): TROPIPOC in the last 168 hours.   BNPNo results for input(s): BNP, PROBNP in the last 168 hours.   DDimer No results for input(s): DDIMER in the last 168 hours.   Radiology    Ct Head Wo Contrast  Result Date: 04/19/2019 CLINICAL DATA:  Blurred vision,  headache. EXAM: CT HEAD WITHOUT CONTRAST TECHNIQUE: Contiguous axial images were obtained from the base of the skull through the vertex without intravenous contrast. COMPARISON:  None. FINDINGS: Brain: No evidence of acute infarction, hemorrhage, hydrocephalus, extra-axial collection or mass lesion/mass effect. Vascular: No hyperdense vessel or unexpected calcification. Skull: Normal. Negative for fracture or focal lesion. Sinuses/Orbits: No acute finding. Other: None. IMPRESSION: Normal head CT. Electronically Signed   By: Marijo Conception M.D.   On: 04/19/2019 15:21   Dg Chest Port 1 View  Result Date: 04/19/2019 CLINICAL DATA:  BILATERAL chest pain and shortness of breath. EXAM: PORTABLE CHEST 1 VIEW COMPARISON:  01/05/2018. FINDINGS: The heart size and mediastinal contours are within normal limits. Both lungs are clear. The visualized skeletal structures are unremarkable. IMPRESSION: No active disease. Electronically Signed   By: Staci Righter M.D.   On: 04/19/2019 13:32   Ct Angio Chest/abd/pel For  Dissection W And/or Wo Contrast  Result Date: 04/19/2019 CLINICAL DATA:  Chest pain on Saturday. Short of breath and blurred vision. Recurrent chest pain and headache. Shoulder blade pain. Concern for aortic dissection EXAM: CT ANGIOGRAPHY CHEST, ABDOMEN AND PELVIS TECHNIQUE: Multidetector CT imaging through the chest, abdomen and pelvis was performed using the standard protocol during bolus administration of intravenous contrast. Multiplanar reconstructed images and MIPs were obtained and reviewed to evaluate the vascular anatomy. CONTRAST:  160mL OMNIPAQUE IOHEXOL 350 MG/ML SOLN COMPARISON:  CT abdomen 01/29/2015 FINDINGS: CTA CHEST FINDINGS Cardiovascular: Noncontrast imaging demonstrates no intramural hematoma within the thoracic aorta. Contrast enhanced imaging demonstrates no dissection or aneurysm of the thoracic aorta. Great vessels are normal. No pericardial fluid. Heart appears normal. No central pulmonary embolism. Mediastinum/Nodes: No axillary or supraclavicular adenopathy. No mediastinal hilar adenopathy. Esophagus normal. Lungs/Pleura: No suspicious pulmonary nodules. No pulmonary infarction. No infiltrate. No pleural fluid. No pneumothorax. Musculoskeletal: No acute osseous findings. Review of the MIP images confirms the above findings. CTA ABDOMEN AND PELVIS FINDINGS VASCULAR Aorta: Abdominal aorta is normal in caliber. No aortic dissection or aneurysm. Celiac: Widely patent SMA: Widely patent Renals: Single patent renal arteries IMA: Widely patent Inflow: Normal Veins: Poorly opacified but normal Review of the MIP images confirms the above findings. NON-VASCULAR Hepatobiliary: Simple cysts in LEFT hepatic lobe. No biliary duct dilatation. Postcholecystectomy Pancreas: .  No pancreatic inflammation or duct dilatation. Spleen: Normal spleen Adrenals/Urinary Tract: Adrenal glands, kidneys, ureters and bladder normal. Stomach/Bowel: Stomach, small-bowel, appendix cecum normal. The colon and  rectosigmoid colon are normal. Lymphatic: No retroperitoneal periportal adenopathy. No pelvic adenopathy. Reproductive: Prostate normal Other: No free fluid.  Hernia. Musculoskeletal: No acute osseous abnormality. Review of the MIP images confirms the above findings. IMPRESSION: Chest Impression: 1. Normal thoracic aorta.  No dissection or aneurysm. 2. No acute pulmonary parenchymal findings. Abdomen / Pelvis Impression: 1. No aortic dissection or aneurysm. 2. No acute findings in the abdomen or pelvis. 3. No acute musculoskeletal abnormality identified. Electronically Signed   By: Suzy Bouchard M.D.   On: 04/19/2019 15:45    Cardiac Studies   Pending coronary CTA  Patient Profile     58 y.o. male with PMH HTN, strong family history of CAD, history of chest pain who was seen at the request of Dr. Lorin Mercy for increasingly intense episodes of chest pain on exertion concerning for angina.  Assessment & Plan    Chest pain: both typical and atypical symptoms. He does have risk factors. See yesterday's note for shared decision making re: testing -planned for coronary CT today. He  will received metoprolol and SL NG. He may require IV fluids with SL NG as he reports a distant history of hypotension with administration. -Cr stable (did get dye with CT dissection yesterday) -troponin negative x3, no further chest pain -was on aspirin at home, may need statin continued at discharge based on results of scan. -if scan without high risk lesion, can be discharged once the read has returned.  Risk factors: -family history (not premature, father and sister around the age of 60) -lipids: ASCVD risk 7%, LDL 106, HDL 55, TG 67 -normal BMI -no tobacco -no diabetes, A1c 5.5% -normal TSH -hypertension: BP has been generally well controlled here. On lisinopril-HCTZ at home. Holding this AM so he can tolerate metoprolol/SL NG for study. Restart at discharge  The 10-year ASCVD risk score Denman George(Goff [place] Montez HagemanJr., et al.,  2013) is: 7.1%   Values used to calculate the score:     Age: [age] years     Sex: Male     Is Non-Hispanic African American: No     Diabetic: No     Tobacco smoker: No     Systolic Blood Pressure: 124 mmHg     Is BP treated: Yes     HDL Cholesterol: 55 mg/dL     Total Cholesterol: 174 mg/dL  For questions or updates, please contact [facility] Please consult [email or web address] for contact info under     Signed, Jodelle RedBridgette Jeane Cashatt, MD  04/20/2019, 8:10 AM

## 2019-04-20 NOTE — Progress Notes (Signed)
   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  Pt is OK to discharge from cardiac standpoint. Will arrange for cardiology follow up as an outpatient.   Daune Perch, AGNP-C [facility] 04/20/2019  4:14 PM Pager: [phone]

## 2019-04-20 NOTE — Discharge Summary (Signed)
Physician Discharge Summary  Dustin [name]:[id number] DOB: 1961-04-06 DOA: 04/19/2019  PCP: Billie Ruddy, MD  Admit date: 04/19/2019 Discharge date: 04/20/2019  Admitted From: home  Disposition:  Home   Recommendations for Outpatient Follow-up:  1. Follow up with PCP in 1-2 weeks 2. Follow up with cardiology   Home Health:NA  Equipment/Devices:NA   Discharge Condition:stable   CODE STATUS:full code  Diet recommendation: cardiac diet   Discharge summary. 58 year old gentleman with history of hypertension, strong family stay of coronary artery disease, history of chest pain who presented to the emergency room with increasing intensity of chest pain on exertion for the last few days.  He had both typical and atypical symptoms.  Was admitted to the hospital and treated with heparin infusion. Nonspecific EKG changes.  Sequential troponins were nonischemic.  Seen by cardiology.  Currently asymptomatic. Underwent CT coronaries that was essentially normal.  Negative for PE. LDL 106, will start patient on low-dose atorvastatin.  Normal BMI.  No tobacco use.  A1c 5.5.  Normal TSH.  Hypertension well-controlled on lisinopril hydrochlorothiazide.  Will discharge on aspirin and low-dose atorvastatin with outpatient cardiology follow-up.  Discharge Diagnoses:  Principal Problem:   Unstable angina [facility]) Active Problems:   Essential hypertension   Impaired glucose tolerance    Discharge Instructions  Discharge Instructions    Call MD for:   Complete by: As directed    Recurrent chest pain and shortness of breath   Diet - low sodium heart healthy   Complete by: As directed    Increase activity slowly   Complete by: As directed      Allergies as of 04/20/2019      Reactions   Novocain [procaine] Swelling      Medication List    TAKE these medications   acetaminophen 500 MG tablet Commonly known as: TYLENOL Take 1,000 mg by mouth every 6 (six) hours as needed for headache  (pain).   aspirin EC 81 MG tablet Take 81 mg by mouth daily as needed (chest pain).   atorvastatin 20 MG tablet Commonly known as: LIPITOR Take 2 tablets (40 mg total) by mouth daily at 6 PM.   fluticasone 50 MCG/ACT nasal spray Commonly known as: FLONASE PLACE 1 SPRAY INTO BOTH NOSTRILS DAILY. What changed:   when to take this  reasons to take this   lisinopril-hydrochlorothiazide 20-12.5 MG tablet Commonly known as: ZESTORETIC Take 0.5 tablets by mouth daily.   multivitamin with minerals Tabs tablet Take 1 tablet by mouth daily.      Follow-up Information    Buford Dresser, MD Follow up.   Specialty: Cardiology Why: Hospital cardiology follow up on 05/15/19 at 11:00. This may be a virtual visit. You will be notified closer to the visit.  Contact information: [address] [place] [address] [phone]          Allergies  Allergen Reactions  . Novocain [Procaine] Swelling    Consultations:  Cardiology   Procedures/Studies: Ct Head Wo Contrast  Result Date: 04/19/2019 CLINICAL DATA:  Blurred vision, headache. EXAM: CT HEAD WITHOUT CONTRAST TECHNIQUE: Contiguous axial images were obtained from the base of the skull through the vertex without intravenous contrast. COMPARISON:  None. FINDINGS: Brain: No evidence of acute infarction, hemorrhage, hydrocephalus, extra-axial collection or mass lesion/mass effect. Vascular: No hyperdense vessel or unexpected calcification. Skull: Normal. Negative for fracture or focal lesion. Sinuses/Orbits: No acute finding. Other: None. IMPRESSION: Normal head CT. Electronically Signed   By: Jeneen Rinks  Christen ButterGreen Jr M.D.   On: 04/19/2019 15:21   Ct Coronary Morph W/cta Cor W/score W/ca W/cm &/or Wo/cm  Result Date: 04/20/2019 EXAM: OVER-READ INTERPRETATION  CT CHEST The following report is an over-read performed by radiologist Dr. Trudie Reedaniel Entrikin of [facility] on 04/20/2019. This over-read does not  include interpretation of cardiac or coronary anatomy or pathology. The coronary calcium score/coronary CTA interpretation by the cardiologist is attached. COMPARISON:  Chest CT 04/19/2019. FINDINGS: Within the visualized portions of the thorax there are no suspicious appearing pulmonary nodules or masses, there is no acute consolidative airspace disease, no pleural effusions, no pneumothorax and no lymphadenopathy. Visualized portions of the upper abdomen are unremarkable. There are no aggressive appearing lytic or blastic lesions noted in the visualized portions of the skeleton. IMPRESSION: No significant incidental noncardiac findings are noted. Electronically Signed   By: Trudie Reedaniel  Entrikin M.D.   On: 04/20/2019 15:26   Dg Chest Port 1 View  Result Date: 04/19/2019 CLINICAL DATA:  BILATERAL chest pain and shortness of breath. EXAM: PORTABLE CHEST 1 VIEW COMPARISON:  01/05/2018. FINDINGS: The heart size and mediastinal contours are within normal limits. Both lungs are clear. The visualized skeletal structures are unremarkable. IMPRESSION: No active disease. Electronically Signed   By: Elsie StainJohn T Curnes M.D.   On: 04/19/2019 13:32   Ct Coronary Fractional Flow Reserve Fluid Analysis  Result Date: 04/20/2019 CLINICAL DATA:  58 year old male with chest pain. EXAM: Cardiac/Coronary  CT TECHNIQUE: The patient was scanned on a [employer] scanner. FINDINGS: A 120 kV prospective scan was triggered in the descending thoracic aorta at 111 HU's. Axial non-contrast 3 mm slices were carried out through the heart. The data set was analyzed on a dedicated work station and scored using the Agatson method. Gantry rotation speed was 250 msecs and collimation was .6 mm. 50 mg of PO Metoprolol and 0.8 mg of sl NTG was given. The 3D data set was reconstructed in 5% intervals of the 67-82 % of the R-R cycle. Diastolic phases were analyzed on a dedicated work station using MPR, MIP and VRT modes. The patient received 80 cc of  contrast. Aorta: Normal size. Minimal atherosclerotic plaque and no calcifications. No dissection. Aortic Valve:  Trileaflet.  No calcifications. Coronary Arteries:  Normal coronary origin.  Right dominance. RCA is a large dominant artery that gives rise to PDA and PLVB. There is no plaque. Left main is a large artery that gives rise to LAD, two ramus intermedius and LCX arteries. Left main has no plaque. LAD is a large vessel that has no plaque. RI 1 and RI 2 are medium caliber vessels with no plaque. LCX is a non-dominant artery that gives rise to one large OM1 branch. There is no plaque. Other findings: Normal pulmonary vein drainage into the left atrium. Normal let atrial appendage without a thrombus. HISTORY OF PRESENT ILLNESS: 1. Coronary calcium score of 0. This was 0 percentile for age and sex matched control. 2. Normal coronary origin with right dominance. 3. No evidence of CAD.  Consider non-cardiac causes of chest pain. 4. Moderately dilated pulmonary artery measuring 37 mm suggestive of pulmonary hypertension. Electronically Signed   By: Tobias AlexanderKatarina  Nelson   On: 04/20/2019 16:12   Ct Angio Chest/abd/pel For Dissection W And/or Wo Contrast  Result Date: 04/19/2019 CLINICAL DATA:  Chest pain on Saturday. Short of breath and blurred vision. Recurrent chest pain and headache. Shoulder blade pain. Concern for aortic dissection EXAM: CT ANGIOGRAPHY CHEST, ABDOMEN AND PELVIS TECHNIQUE:  Multidetector CT imaging through the chest, abdomen and pelvis was performed using the standard protocol during bolus administration of intravenous contrast. Multiplanar reconstructed images and MIPs were obtained and reviewed to evaluate the vascular anatomy. CONTRAST:  OMNIPAQUE IOHEXOL 350 MG/ML SOLN COMPARISON:  CT abdomen 01/29/2015 FINDINGS: CTA CHEST FINDINGS Cardiovascular: Noncontrast imaging demonstrates no intramural hematoma within the thoracic aorta. Contrast enhanced imaging demonstrates no dissection or  aneurysm of the thoracic aorta. Great vessels are normal. No pericardial fluid. Heart appears normal. No central pulmonary embolism. Mediastinum/Nodes: No axillary or supraclavicular adenopathy. No mediastinal hilar adenopathy. Esophagus normal. Lungs/Pleura: No suspicious pulmonary nodules. No pulmonary infarction. No infiltrate. No pleural fluid. No pneumothorax. Musculoskeletal: No acute osseous findings. Review of the MIP images confirms the above findings. CTA ABDOMEN AND PELVIS FINDINGS VASCULAR Aorta: Abdominal aorta is normal in caliber. No aortic dissection or aneurysm. Celiac: Widely patent SMA: Widely patent Renals: Single patent renal arteries IMA: Widely patent Inflow: Normal Veins: Poorly opacified but normal Review of the MIP images confirms the above findings. NON-VASCULAR Hepatobiliary: Simple cysts in LEFT hepatic lobe. No biliary duct dilatation. Postcholecystectomy Pancreas: .  No pancreatic inflammation or duct dilatation. Spleen: Normal spleen Adrenals/Urinary Tract: Adrenal glands, kidneys, ureters and bladder normal. Stomach/Bowel: Stomach, small-bowel, appendix cecum normal. The colon and rectosigmoid colon are normal. Lymphatic: No retroperitoneal periportal adenopathy. No pelvic adenopathy. Reproductive: Prostate normal Other: No free fluid.  Hernia. Musculoskeletal: No acute osseous abnormality. Review of the MIP images confirms the above findings. IMPRESSION: Chest Impression: 1. Normal thoracic aorta.  No dissection or aneurysm. 2. No acute pulmonary parenchymal findings. Abdomen / Pelvis Impression: 1. No aortic dissection or aneurysm. 2. No acute findings in the abdomen or pelvis. 3. No acute musculoskeletal abnormality identified. Electronically Signed   By: Genevive Bi M.D.   On: 04/19/2019 15:45      Subjective: Patient seen and examined for discharge readiness.  Remains asymptomatic.  Results were normal.   Discharge Exam: Vitals:   04/20/19 1500 04/20/19 1513  BP:  133/83 119/76  Pulse:    Resp:    Temp:    SpO2:     Vitals:   04/20/19 1230 04/20/19 1357 04/20/19 1500 04/20/19 1513  BP: 116/71 128/73 133/83 119/76  Pulse: 70 61    Resp:  20    Temp:  98.3 F (36.8 C)    TempSrc:  Oral    SpO2:  98%    Weight:        General: Pt is alert, awake, not in acute distress Cardiovascular: RRR, S1/S2 +, no rubs, no gallops Respiratory: CTA bilaterally, no wheezing, no rhonchi Abdominal: Soft, NT, ND, bowel sounds + Extremities: no edema, no cyanosis    The results of significant diagnostics from this hospitalization (including imaging, microbiology, ancillary and laboratory) are listed below for reference.     Microbiology: Recent Results (from the past 240 hour(s))  SARS Coronavirus 2     Status: None   Collection Time: 04/19/19  3:45 PM  Result Value Ref Range Status   SARS Coronavirus 2 NOT DETECTED NOT DETECTED Final    Comment: (NOTE) SARS-CoV-2 target nucleic acids are NOT DETECTED. The SARS-CoV-2 RNA is generally detectable in upper and lower respiratory specimens during the acute phase of infection.  Negative  results do not preclude SARS-CoV-2 infection, do not rule out co-infections with other pathogens, and should not be used as the sole basis for treatment or other patient management decisions.  Negative results must be combined  with clinical observations, patient history, and epidemiological information. The expected result is Not Detected. Fact Sheet for Patients: http://www.biofiredefense.com/wp-content/uploads/2020/03/BIOFIRE-COVID -19-patients.pdf Fact Sheet for Healthcare Providers: http://www.biofiredefense.com/wp-content/uploads/2020/03/BIOFIRE-COVID -19-hcp.pdf This test is not yet approved or cleared by the [place] FDA and  has been authorized for detection and/or diagnosis of SARS-CoV-2 by FDA under an Emergency Use Authorization (EUA).  This EUA will remain in effec t (meaning this test can be used)  for the duration of  the COVID-19 declaration under Section 564(b)(1) of the Act, 21 U.S.C. section 360bbb-3(b)(1), unless the authorization is terminated or revoked sooner. Performed at [facility], [address] [place], [place] [address]      Labs: BNP (last 3 results) No results for input(s): BNP in the last 8760 hours. Basic Metabolic Panel: Recent Labs  Lab 04/19/19 1355 04/20/19 0425  NA 138 139  K 4.4 4.1  CL 104 106  CO2 25 22  GLUCOSE 110* 95  BUN 17 17  CREATININE 1.11 1.13  CALCIUM 9.4 9.2  MG 2.1  --    Liver Function Tests: Recent Labs  Lab 04/19/19 1355  AST 22  ALT 19  ALKPHOS 37*  BILITOT 1.0  PROT 7.4  ALBUMIN 4.2   No results for input(s): LIPASE, AMYLASE in the last 168 hours. No results for input(s): AMMONIA in the last 168 hours. CBC: Recent Labs  Lab 04/19/19 1355 04/20/19 0425  WBC 7.0 8.5  NEUTROABS 5.0  --   HGB 14.5 14.3  HCT 42.4 41.4  MCV 90.4 89.6  PLT 270 268   Cardiac Enzymes: Recent Labs  Lab 04/19/19 1355 04/19/19 1943 04/20/19 0425 04/20/19 1013  TROPONINI <0.03 <0.03 <0.03 <0.03   BNP: Invalid input(s): POCBNP CBG: No results for input(s): GLUCAP in the last 168 hours. D-Dimer No results for input(s): DDIMER in the last 72 hours. Hgb A1c Recent Labs    04/19/19 1810  HGBA1C 5.5   Lipid Profile Recent Labs    04/20/19 0425  CHOL 174  HDL 55  LDLCALC 106*  TRIG 67  CHOLHDL 3.2   Thyroid function studies Recent Labs    04/19/19 1943  TSH 0.891   Anemia work up No results for input(s): VITAMINB12, FOLATE, FERRITIN, TIBC, IRON, RETICCTPCT in the last 72 hours. Urinalysis    Component Value Date/Time   COLORURINE YELLOW 01/29/2015 0547   APPEARANCEUR CLEAR 01/29/2015 0547   LABSPEC 1.027 01/29/2015 0547   PHURINE 5.5 01/29/2015 0547   GLUCOSEU NEGATIVE 01/29/2015 0547   GLUCOSEU NEGATIVE 07/25/2009 0807   HGBUR LARGE (A) 01/29/2015 0547   HGBUR negative 11/26/2010 1004    BILIRUBINUR n 01/05/2016 1013   KETONESUR NEGATIVE 01/29/2015 0547   PROTEINUR n 01/05/2016 1013   PROTEINUR NEGATIVE 01/29/2015 0547   UROBILINOGEN 0.2 01/05/2016 1013   UROBILINOGEN 0.2 01/29/2015 0547   NITRITE n 01/05/2016 1013   NITRITE NEGATIVE 01/29/2015 0547   LEUKOCYTESUR Negative 01/05/2016 1013   Sepsis Labs Invalid input(s): PROCALCITONIN,  WBC,  LACTICIDVEN Microbiology Recent Results (from the past 240 hour(s))  SARS Coronavirus 2     Status: None   Collection Time: 04/19/19  3:45 PM  Result Value Ref Range Status   SARS Coronavirus 2 NOT DETECTED NOT DETECTED Final    Comment: (NOTE) SARS-CoV-2 target nucleic acids are NOT DETECTED. The SARS-CoV-2 RNA is generally detectable in upper and lower respiratory specimens during the acute phase of infection.  Negative  results do not preclude SARS-CoV-2 infection, do not rule out co-infections  with other pathogens, and should not be used as the sole basis for treatment or other patient management decisions.  Negative results must be combined with clinical observations, patient history, and epidemiological information. The expected result is Not Detected. Fact Sheet for Patients: http://www.biofiredefense.com/wp-content/uploads/2020/03/BIOFIRE-COVID -19-patients.pdf Fact Sheet for Healthcare Providers: http://www.biofiredefense.com/wp-content/uploads/2020/03/BIOFIRE-COVID -19-hcp.pdf This test is not yet approved or cleared by the [place] FDA and  has been authorized for detection and/or diagnosis of SARS-CoV-2 by FDA under an Emergency Use Authorization (EUA).  This EUA will remain in effec t (meaning this test can be used) for the duration of  the COVID-19 declaration under Section 564(b)(1) of the Act, 21 U.S.C. section 360bbb-3(b)(1), unless the authorization is terminated or revoked sooner. Performed at [facility], [address] [place], [place] [address]      Time coordinating discharge:   25 minutes  SIGNED:   Dorcas CarrowKuber Dak Szumski, MD  [facility] 04/20/2019, 4:26 PM Pager [phone]  If 7PM-7AM, please contact night-coverage [email or web address] Password [email or web address]

## 2019-04-20 NOTE — Progress Notes (Signed)
Gave pt Metoprolol 12.5mg  6/11 @ 2151.  HR at that time was 90-115.  6/12- 0420 pt HR is 59- Pt to have coronary CTA today and has Metoprolol 100 mg scheduled @ 0700 for procedure. Will hand this off to day shift RN to clarify dose.

## 2019-04-22 DIAGNOSIS — L03119 Cellulitis of unspecified part of limb: Secondary | ICD-10-CM | POA: Diagnosis not present

## 2019-04-22 DIAGNOSIS — S61452A Open bite of left hand, initial encounter: Secondary | ICD-10-CM | POA: Diagnosis not present

## 2019-04-25 DIAGNOSIS — S61452A Open bite of left hand, initial encounter: Secondary | ICD-10-CM | POA: Diagnosis not present

## 2019-04-25 DIAGNOSIS — L03119 Cellulitis of unspecified part of limb: Secondary | ICD-10-CM | POA: Diagnosis not present

## 2019-04-25 DIAGNOSIS — S62301K Unspecified fracture of second metacarpal bone, left hand, subsequent encounter for fracture with nonunion: Secondary | ICD-10-CM | POA: Diagnosis not present

## 2019-05-01 DIAGNOSIS — S62311A Displaced fracture of base of second metacarpal bone. left hand, initial encounter for closed fracture: Secondary | ICD-10-CM | POA: Diagnosis not present

## 2019-05-10 ENCOUNTER — Telehealth: Payer: Self-pay

## 2019-05-10 NOTE — Telephone Encounter (Signed)
   Pt informed of visitor's policy and made aware to wear a mask. COVID-19 Pre-Screening Questions:  . In the past 7 to 10 days have you had a cough,  shortness of breath, headache, congestion, fever (100 or greater) body aches, chills, sore throat, or sudden loss of taste or sense of smell?NO . Have you been around anyone with known Covid 19. NO . Have you been around anyone who is awaiting Covid 19 test results in the past 7 to 10 days?NO . Have you been around anyone who has been exposed to Covid 19, or has mentioned symptoms of Covid 19 within the past 7 to 10 days? Pt stated his wife was exposed to someone last Friday but was tested and had negative results   If you have any concerns/questions about symptoms patients report during screening (either on the phone or at threshold). Contact the provider seeing the patient or DOD for further guidance.  If neither are available contact a member of the leadership team.

## 2019-05-15 ENCOUNTER — Encounter: Payer: Self-pay | Admitting: Cardiology

## 2019-05-15 ENCOUNTER — Ambulatory Visit: Payer: BLUE CROSS/BLUE SHIELD | Admitting: Cardiology

## 2019-05-15 ENCOUNTER — Other Ambulatory Visit: Payer: Self-pay

## 2019-05-15 VITALS — BP 138/74 | HR 80 | Temp 97.9°F | Ht 73.0 in | Wt 192.0 lb

## 2019-05-15 DIAGNOSIS — R0789 Other chest pain: Secondary | ICD-10-CM

## 2019-05-15 DIAGNOSIS — Z7189 Other specified counseling: Secondary | ICD-10-CM

## 2019-05-15 DIAGNOSIS — Z712 Person consulting for explanation of examination or test findings: Secondary | ICD-10-CM | POA: Diagnosis not present

## 2019-05-15 MED ORDER — ATORVASTATIN CALCIUM 20 MG PO TABS: 20.0000 mg | ORAL_TABLET | Freq: Every day | ORAL | 3 refills | Status: DC

## 2019-05-15 NOTE — Progress Notes (Signed)
Cardiology Office Note:    Date:  05/15/2019   ID:  [name], DOB Mar 31, 1961, MRN [id number]  PCP:  Billie Ruddy, MD  Cardiologist:  Buford Dresser, MD PhD  Referring MD: Billie Ruddy, MD   CC: post hospital follow up  History of Present Illness:    Dustin [name] is a 58 y.o. male with a hx of HTN, strong FH of CAD who is seen for post hospital follow up. I met him in the hospital on 04/19/19 during an [place] for chest pain.  Cardiac history: longstanding chest pain, normal myoview 2017. Ct coronary 04/2019 during admission for chest pain with calcium score of 0, normal cors, no evidence of CAD. PA measured 37 mm. CT angio chest/dissection done the day prior was unremarkable.  Today: Overall doing very well. No more chest pain. He hasnt started taking lipitor yet. LDL 106 on check in hospital. Wanted to discuss to see if he should start it.   Eating very healthy, uses HelloFresh meals and does not add salt. Walks 3-4 mile/sday at work, climbs stairs. Plans to go back to the gym agree.   Discussed guidelines, prognostic value of calcium score of 0. Discussed his family history at length as well. reviwed lifestyle recommendations, goals, management options. Takes aspirin not daily but intermittently. Had prolonged shared decision making session re: statins, aspirin. Reviewed pathophysiology of CAD at length, mechanism of statins and aspirin.  Denies chest pain, shortness of breath at rest or with normal exertion. No PND, orthopnea, LE edema or unexpected weight gain. No syncope or palpitations.  Past Medical History:  Diagnosis Date  . GERD (gastroesophageal reflux disease)    occasionally with no meds  . Hypertension   . 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 CHOLECYSTECTOMY  2003  . UMBILICAL HERNIA REPAIR  2003  . WISDOM TOOTH EXTRACTION      Current Medications: Current Outpatient Medications on File  Prior to Visit  Medication Sig  . acetaminophen (TYLENOL) 500 MG tablet Take 1,000 mg by mouth every 6 (six) hours as needed for headache (pain).  Marland Kitchen aspirin EC 81 MG tablet Take 81 mg by mouth daily as needed (chest pain).  Marland Kitchen atorvastatin (LIPITOR) 20 MG tablet Take 2 tablets (40 mg total) by mouth daily at 6 PM.  . fluticasone (FLONASE) 50 MCG/ACT nasal spray PLACE 1 SPRAY INTO BOTH NOSTRILS DAILY. (Patient taking differently: Place 1 spray into both nostrils daily as needed for allergies. )  . lisinopril-hydrochlorothiazide (PRINZIDE,ZESTORETIC) 20-12.5 MG tablet Take 0.5 tablets by mouth daily.  . Multiple Vitamin (MULTIVITAMIN WITH MINERALS) TABS tablet Take 1 tablet by mouth daily.   No current facility-administered medications on file prior to visit.      Allergies:   Novocain [procaine]   Social History   Socioeconomic History  . Marital status: Married    Spouse name: Not on file  . Number of children: Not on file  . Years of education: Not on file  . Highest education level: Not on file  Occupational History  . Occupation: IT trainer: [employer]  . Financial resource strain: Not on file  . Food insecurity    Worry: Not on file    Inability: Not on file  . Transportation needs    Medical: Not on file    Non-medical: Not on file  Tobacco Use  . Smoking status: Never Smoker  .  Smokeless tobacco: Never Used  Substance and Sexual Activity  . Alcohol use: Yes    Comment: occ beer  . Drug use: No  . Sexual activity: Yes  Lifestyle  . Physical activity    Days per week: Not on file    Minutes per session: Not on file  . Stress: Not on file  Relationships  . Social Herbalist on phone: Not on file    Gets together: Not on file    Attends religious service: Not on file    Active member of club or organization: Not on file    Attends meetings of clubs or organizations: Not on file    Relationship status:  Not on file  Other Topics Concern  . Not on file  Social History Narrative   Lives in [place] with his wife.     Family History: The patient's family history includes CAD in his father; Cancer in his sister; Heart disease in his sister; Hypertension in his mother. There is no history of Colon cancer, Colon polyps, Rectal cancer, or Stomach cancer.  ROS:   Please see the history of present illness.  Additional pertinent ROS:  Constitutional: Negative for chills, fever, night sweats, unintentional weight loss  HENT: Negative for ear pain and hearing loss.   Eyes: Negative for loss of vision and eye pain.  Respiratory: Negative for cough, sputum, shortness of breath, wheezing.   Cardiovascular: See HPI. Gastrointestinal: Negative for abdominal pain, melena, and hematochezia.  Genitourinary: Negative for dysuria and hematuria.  Musculoskeletal: Negative for falls and myalgias.  Skin: Negative for itching and rash.  Neurological: Negative for focal weakness, focal sensory changes and loss of consciousness.  Endo/Heme/Allergies: Does not bruise/bleed easily.    EKGs/Labs/Other Studies Reviewed:    The following studies were reviewed today: Exercise nuclear stress 08/26/2016 CT coronary 04/20/19  EKG:  EKG is personally reviewed.  The ekg ordered 04/20/19 demonstrates NSR  Recent Labs: 04/19/2019: ALT 19; Magnesium 2.1; TSH 0.891 04/20/2019: BUN 17; Creatinine, Ser 1.13; Hemoglobin 14.3; Platelets 268; Potassium 4.1; Sodium 139  Recent Lipid Panel    Component Value Date/Time   CHOL 174 04/20/2019 0425   TRIG 67 04/20/2019 0425   HDL 55 04/20/2019 0425   CHOLHDL 3.2 04/20/2019 0425   VLDL 13 04/20/2019 0425   LDLCALC 106 (H) 04/20/2019 0425    Physical Exam:    VS:  BP 138/74 (BP Location: Right Arm, Cuff Size: Normal)   Pulse 80   Temp 97.9 F (36.6 C)   Ht '6\' 1"'$  (1.854 m)   Wt 192 lb (87.1 kg)   BMI 25.33 kg/m     Wt Readings from Last 3 Encounters:  05/15/19  192 lb (87.1 kg)  04/20/19 182 lb 3.2 oz (82.6 kg)  10/12/18 185 lb (83.9 kg)     GEN: Well nourished, well developed in no acute distress HEENT: Normal NECK: No JVD; No carotid bruits LYMPHATICS: No lymphadenopathy CARDIAC: regular rhythm, normal S1 and S2, no murmurs, rubs, gallops. Radial and DP pulses 2+ bilaterally. RESPIRATORY:  Clear to auscultation without rales, wheezing or rhonchi  ABDOMEN: Soft, non-tender, non-distended MUSCULOSKELETAL:  No edema; No deformity  SKIN: Warm and dry NEUROLOGIC:  Alert and oriented x 3 PSYCHIATRIC:  Normal affect   ASSESSMENT:    1. Encounter to discuss test results   2. Other chest pain   3. Cardiac risk counseling   4. Counseling on health promotion and disease prevention    PLAN:  Chest pain, with recent admission: CT coronary without plaque or calcium, reviewed this result with him today -discussed prognostic value of this at length, including showing graphs from clinical trials -spent significant time re: shared decision making for aspirin and statin -though his calcium score is low risk, he does have family history. Decided to take atorvastatin 20 mg daily (instead of 40) and stop aspirin -counseled on red flag warning signs  Cardiac risk counseling and prevention recommendations: -recommend heart healthy/Mediterranean diet, with whole grains, fruits, vegetable, fish, lean meats, nuts, and olive oil. Limit salt. -recommend moderate walking, 3-5 times/week for 30-50 minutes each session. Aim for at least 150 minutes.week. Goal should be pace of 3 miles/hours, or walking 1.5 miles in 30 minutes -recommend avoidance of tobacco products. Avoid excess alcohol. -Additional risk factor control:  -Diabetes: A1c is 5.5, no history of diabetes  -Lipids: Tchol 174, TG 67, HDL 55, LDL 106 not on statin  -Blood pressure control: near goal, wants to work on lifestyle changes prior to discussing medication changes  -Weight: BMI 25 -ASCVD  risk score: The 10-year ASCVD risk score Mikey Bussing DC Brooke Bonito., et al., 2013) is: 7.8%   Values used to calculate the score:     Age: 73 years     Sex: Male     Is Non-Hispanic African American: No     Diabetic: No     Tobacco smoker: No     Systolic Blood Pressure: 706 mmHg     Is BP treated: Yes     HDL Cholesterol: 55 mg/dL     Total Cholesterol: 174 mg/dL    Plan for follow up: 1 year or sooner PRN  TIME SPENT WITH PATIENT: 28 minutes of direct patient care. More than 50% of that time was spent on coordination of care and counseling regarding test results, shared decision making, and medications for primary prevention.  Buford Dresser, MD, PhD [place]  [facility]   Medication Adjustments/Labs and Tests Ordered: Current medicines are reviewed at length with the patient today.  Concerns regarding medicines are outlined above.  No orders of the defined types were placed in this encounter.  Meds ordered this encounter  Medications  . atorvastatin (LIPITOR) 20 MG tablet    Sig: Take 1 tablet (20 mg total) by mouth daily at 6 PM.    Dispense:  90 tablet    Refill:  3    Patient Instructions  Medication Instructions:  Decrease Lipitor to 20 mg daily Stop: Aspirin 81 mg   If you need a refill on your cardiac medications before your next appointment, please call your pharmacy.   Lab work: None  Testing/Procedures: None  Follow-Up: At [facility], you and your health needs are our priority.  As part of our continuing mission to provide you with exceptional heart care, we have created designated Provider Care Teams.  These Care Teams include your primary Cardiologist (physician) and Advanced Practice Providers (APPs -  Physician Assistants and Nurse Practitioners) who all work together to provide you with the care you need, when you need it. You will need a follow up appointment in 1 years.  Please call our office 2 months in advance to schedule this appointment.   You may see Buford Dresser, MD or one of the following Advanced Practice Providers on your designated Care Team:   Rosaria Ferries, PA-C . Jory Sims, DNP, ANP       Signed, Buford Dresser, MD PhD 05/15/2019 9:00 PM  [facility]

## 2019-05-15 NOTE — Patient Instructions (Addendum)
Medication Instructions:  Decrease Lipitor to 20 mg daily Stop: Aspirin 81 mg   If you need a refill on your cardiac medications before your next appointment, please call your pharmacy.   Lab work: None  Testing/Procedures: None  Follow-Up: At [facility], you and your health needs are our priority.  As part of our continuing mission to provide you with exceptional heart care, we have created designated Provider Care Teams.  These Care Teams include your primary Cardiologist (physician) and Advanced Practice Providers (APPs -  Physician Assistants and Nurse Practitioners) who all work together to provide you with the care you need, when you need it. You will need a follow up appointment in 1 years.  Please call our office 2 months in advance to schedule this appointment.  You may see Buford Dresser, MD or one of the following Advanced Practice Providers on your designated Care Team:   Rosaria Ferries, PA-C . Jory Sims, DNP, ANP

## 2019-05-17 ENCOUNTER — Encounter: Payer: Self-pay | Admitting: Cardiology

## 2019-06-05 DIAGNOSIS — S62311D Displaced fracture of base of second metacarpal bone. left hand, subsequent encounter for fracture with routine healing: Secondary | ICD-10-CM | POA: Diagnosis not present

## 2019-08-27 ENCOUNTER — Ambulatory Visit: Payer: BC Managed Care – PPO | Admitting: Cardiology

## 2019-08-27 ENCOUNTER — Telehealth: Payer: Self-pay | Admitting: Cardiology

## 2019-08-27 ENCOUNTER — Other Ambulatory Visit: Payer: Self-pay

## 2019-08-27 ENCOUNTER — Encounter: Payer: Self-pay | Admitting: Cardiology

## 2019-08-27 VITALS — BP 147/86 | HR 87 | Temp 97.5°F | Ht 73.0 in | Wt 188.4 lb

## 2019-08-27 DIAGNOSIS — Z87898 Personal history of other specified conditions: Secondary | ICD-10-CM | POA: Diagnosis not present

## 2019-08-27 DIAGNOSIS — I10 Essential (primary) hypertension: Secondary | ICD-10-CM | POA: Diagnosis not present

## 2019-08-27 DIAGNOSIS — R030 Elevated blood-pressure reading, without diagnosis of hypertension: Secondary | ICD-10-CM | POA: Insufficient documentation

## 2019-08-27 DIAGNOSIS — R002 Palpitations: Secondary | ICD-10-CM

## 2019-08-27 DIAGNOSIS — Z7189 Other specified counseling: Secondary | ICD-10-CM

## 2019-08-27 NOTE — Telephone Encounter (Signed)
Patient c/o Palpitations:  High priority if patient c/o lightheadedness, shortness of breath, or chest pain  1) How long have you had palpitations/irregular HR/ Afib? Are you having the symptoms now? On and off for the past 4 days  2) Are you currently experiencing lightheadedness, SOB or CP? no  3) Do you have a history of afib (atrial fibrillation) or irregular heart rhythm? yes  4) Have you checked your BP or HR? (document readings if available):   Today:   144/82 HR 84 L Arm   124/78 HR 84 R Arm  10:15 7:00 pm    132/73 HR 74 L Arm   124/74 HR 75 L Arm    125/83 HR 68 R Arm  10/13 7:30 pm   115/75 HR 75 L Arm   117/74 HR 75 R Arm  10/12 7:00 pm   120/77 HR 66 L Arm   124/73 HR 67 R Arm  10/3 3:45 pm   128/78 HR 103 L Arm   103/73 HR 96 L Arm   116/70 Hr 95 R Arm  09/25 6:25 pm    115/76 HR 71 L Arm   129/81 HR 81 R Arm  09/24 4:56 pm   119/70 HR 81 L Arm(only arm checked this day)    5) Are you experiencing any other symptoms? Headache for the past 3 days   Patient states that his heart will beat normally , beat 2x in a row and then skip a beat. He states that sometimes this happens as frequently as every 3rd beat and skip multiple beats in a minute. There have been times when he will have 60 normal beats and it only skips once per minute. It is not consistent when the beats skip. He is concerned with how frequently the beats are irregular.He has an appointment scheduled for Wed 10/28, but wanted to know if he needed to be seen sooner.   Patient states he is taking his medication as directed, but  reports taking a baby aspirin this morning.

## 2019-08-27 NOTE — Progress Notes (Signed)
Cardiology Office Note:    Date:  08/27/2019   ID:  Dustin [name], DOB [birth year], MRN [id number]  PCP:  Billie Ruddy, MD  Cardiologist:  Buford Dresser, MD PhD  Referring MD: Billie Ruddy, MD   CC: urgent follow up for frequent palpitations  History of Present Illness:    Dustin [name] is a 58 y.o. male with a hx of HTN, strong FH of CAD who is seen for post hospital follow up. I met him in the hospital on 04/19/19 during an [place] for chest pain.  Cardiac history: longstanding chest pain, normal myoview 2017. Ct coronary 04/2019 during admission for chest pain with calcium score of 0, normal cors, no evidence of CAD. PA measured 37 mm. CT angio chest/dissection done the day prior was unremarkable.  Today: Noted having more palpitations. Started about 3 weeks ago, then worsened over last three days. Feels he has between 2-30 beats, then a pause. Has been able to be active. No recent changes other than some headache/chest congestion. No med changes, no other recent changes, no illnesses.  Beats feel hard in his chest, sometimes will have many in a minute, sometimes fewer. Cannot find clear pattern to them, except that he does not notice them as much when he is being active.  Denies chest pain, shortness of breath at rest or with normal exertion. No PND, orthopnea, LE edema or unexpected weight gain. No syncope.  Past Medical History:  Diagnosis Date  . GERD (gastroesophageal reflux disease)    occasionally with no meds  . Hypertension   . 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 CHOLECYSTECTOMY  2003  . UMBILICAL HERNIA REPAIR  2003  . WISDOM TOOTH EXTRACTION      Current Medications: Current Outpatient Medications on File Prior to Visit  Medication Sig  . acetaminophen (TYLENOL) 500 MG tablet Take 1,000 mg by mouth every 6 (six) hours as needed for headache (pain).  Marland Kitchen atorvastatin (LIPITOR) 20 MG tablet Take  1 tablet (20 mg total) by mouth daily at 6 PM.  . fluticasone (FLONASE) 50 MCG/ACT nasal spray PLACE 1 SPRAY INTO BOTH NOSTRILS DAILY. (Patient taking differently: Place 1 spray into both nostrils daily as needed for allergies. )  . lisinopril-hydrochlorothiazide (PRINZIDE,ZESTORETIC) 20-12.5 MG tablet Take 0.5 tablets by mouth daily.  . Multiple Vitamin (MULTIVITAMIN WITH MINERALS) TABS tablet Take 1 tablet by mouth daily.   No current facility-administered medications on file prior to visit.      Allergies:   Novocain [procaine]   Social History   Socioeconomic History  . Marital status: Married    Spouse name: Not on file  . Number of children: Not on file  . Years of education: Not on file  . Highest education level: Not on file  Occupational History  . Occupation: IT trainer: [employer]  . Financial resource strain: Not on file  . Food insecurity    Worry: Not on file    Inability: Not on file  . Transportation needs    Medical: Not on file    Non-medical: Not on file  Tobacco Use  . Smoking status: Never Smoker  . Smokeless tobacco: Never Used  Substance and Sexual Activity  . Alcohol use: Yes    Comment: occ beer  . Drug use: No  . Sexual activity: Yes  Lifestyle  . Physical activity    Days per  week: Not on file    Minutes per session: Not on file  . Stress: Not on file  Relationships  . Social Herbalist on phone: Not on file    Gets together: Not on file    Attends religious service: Not on file    Active member of club or organization: Not on file    Attends meetings of clubs or organizations: Not on file    Relationship status: Not on file  Other Topics Concern  . Not on file  Social History Narrative   Lives in [place] with his wife.     Family History: The patient's family history includes CAD in his father; Cancer in his sister; Heart disease in his sister; Hypertension in his  mother. There is no history of Colon cancer, Colon polyps, Rectal cancer, or Stomach cancer.  ROS:   Please see the history of present illness.  Additional pertinent ROS: Constitutional: Negative for chills, fever, night sweats, unintentional weight loss  HENT: Negative for ear pain and hearing loss.   Eyes: Negative for loss of vision and eye pain.  Respiratory: Negative for cough, sputum, wheezing.   Cardiovascular: See HPI. Gastrointestinal: Negative for abdominal pain, melena, and hematochezia.  Genitourinary: Negative for dysuria and hematuria.  Musculoskeletal: Negative for falls and myalgias.  Skin: Negative for itching and rash.  Neurological: Negative for focal weakness, focal sensory changes and loss of consciousness. Positive for headaches Endo/Heme/Allergies: Does not bruise/bleed easily.   EKGs/Labs/Other Studies Reviewed:    The following studies were reviewed today: Exercise nuclear stress 08/26/2016 CT coronary 04/20/19  EKG:  EKG is personally reviewed.  The ekg ordered today demonstrates NSR  Recent Labs: 04/19/2019: ALT 19; Magnesium 2.1; TSH 0.891 04/20/2019: BUN 17; Creatinine, Ser 1.13; Hemoglobin 14.3; Platelets 268; Potassium 4.1; Sodium 139  Recent Lipid Panel    Component Value Date/Time   CHOL 174 04/20/2019 0425   TRIG 67 04/20/2019 0425   HDL 55 04/20/2019 0425   CHOLHDL 3.2 04/20/2019 0425   VLDL 13 04/20/2019 0425   LDLCALC 106 (H) 04/20/2019 0425    Physical Exam:    VS:  BP (!) 147/86   Pulse 87   Temp (!) 97.5 F (36.4 C)   Ht _0  (1.854 m)   Wt 188 lb 6.4 oz (85.5 kg)   SpO2 95%   BMI 24.86 kg/m     Wt Readings from Last 3 Encounters:  08/27/19 188 lb 6.4 oz (85.5 kg)  05/15/19 192 lb (87.1 kg)  04/20/19 182 lb 3.2 oz (82.6 kg)    GEN: Well nourished, well developed in no acute distress HEENT: Normal, moist mucous membranes NECK: No JVD CARDIAC: regular rhythm with intermittent premature beats, normal S1 and S2, no rubs or  gallops. No murmurs. VASCULAR: Radial and DP pulses 2+ bilaterally. No carotid bruits RESPIRATORY:  Clear to auscultation without rales, wheezing or rhonchi  ABDOMEN: Soft, non-tender, non-distended MUSCULOSKELETAL:  Ambulates independently SKIN: Warm and dry, no edema NEUROLOGIC:  Alert and oriented x 3. No focal neuro deficits noted. PSYCHIATRIC:  Normal affect   ASSESSMENT:    1. Palpitation   2. Cardiac risk counseling   3. Counseling on health promotion and disease prevention   4. History of chest pain   5. Elevated blood pressure reading    PLAN:    Palpitations: based on exam, symptoms, my suspicion is for frequent PVCs -will order Zio to evaluate burden -discussed that management depends on burden. May  be helpful to determine if these are better or worse with exercise  Hypertension: stressed today. -continue lisinopril-HCTZ (half of 20-12.5 mg tab) -if BP remains elevated at follow up, would increase to full tab  History of Chest pain:  CT coronary without plaque or calcium -despite low risk CT, has family history. Continue atorvastatin 20 mg, does not have to be at 6 pm specifically, any time is fine -counseled on red flag warning signs  Cardiac risk counseling and prevention recommendations: -recommend heart healthy/Mediterranean diet, with whole grains, fruits, vegetable, fish, lean meats, nuts, and olive oil. Limit salt. -recommend moderate walking, 3-5 times/week for 30-50 minutes each session. Aim for at least 150 minutes.week. Goal should be pace of 3 miles/hours, or walking 1.5 miles in 30 minutes -recommend avoidance of tobacco products. Avoid excess alcohol. -ASCVD risk score: The 10-year ASCVD risk score Mikey Bussing DC Brooke Bonito., et al., 2013) is: 10.5%   Values used to calculate the score:     Age: 56 years     Sex: Male     Is Non-Hispanic African American: No     Diabetic: No     Tobacco smoker: No     Systolic Blood Pressure: 814 mmHg     Is BP treated: Yes      HDL Cholesterol: 55 mg/dL     Total Cholesterol: 174 mg/dL    Plan for follow up: 3 mos or sooner PRN  Buford Dresser, MD, PhD [place]  [facility]   Medication Adjustments/Labs and Tests Ordered: Current medicines are reviewed at length with the patient today.  Concerns regarding medicines are outlined above.  Orders Placed This Encounter  Procedures  . LONG TERM MONITOR (3-14 DAYS)  . EKG 12-Lead   No orders of the defined types were placed in this encounter.   Patient Instructions  Medication Instructions:  Your Physician recommend you continue on your current medication as directed.    *If you need a refill on your cardiac medications before your next appointment, please call your pharmacy*  Lab Work: None   Testing/Procedures: Our physician has recommended that you wear an 3  DAY ZIO-PATCH monitor. The Zio patch cardiac monitor continuously records heart rhythm data for up to 14 days, this is for patients being evaluated for multiple types heart rhythms. For the first 24 hours post application, please avoid getting the Zio monitor wet in the shower or by excessive sweating during exercise. After that, feel free to carry on with regular activities. Keep soaps and lotions away from the ZIO XT Patch.   Someone will contact you to have monitor mailed.     Follow-Up: At [facility], you and your health needs are our priority.  As part of our continuing mission to provide you with exceptional heart care, we have created designated Provider Care Teams.  These Care Teams include your primary Cardiologist (physician) and Advanced Practice Providers (APPs -  Physician Assistants and Nurse Practitioners) who all work together to provide you with the care you need, when you need it.  Your next appointment:   3 months  The format for your next appointment:   In Person  Provider:   Buford Dresser, MD       Signed, Buford Dresser, MD PhD  08/27/2019 10:12 PM    [place]

## 2019-08-27 NOTE — Telephone Encounter (Signed)
SPOKE TO PATIENT . APPOINTMENT MADE FOR TODAY 4:20 PM PATIENT AWARE TO  ABOUT APPOINTMENT.

## 2019-08-27 NOTE — Patient Instructions (Signed)
Medication Instructions:  Your Physician recommend you continue on your current medication as directed.    *If you need a refill on your cardiac medications before your next appointment, please call your pharmacy*  Lab Work: None   Testing/Procedures: Our physician has recommended that you wear an 3  DAY ZIO-PATCH monitor. The Zio patch cardiac monitor continuously records heart rhythm data for up to 14 days, this is for patients being evaluated for multiple types heart rhythms. For the first 24 hours post application, please avoid getting the Zio monitor wet in the shower or by excessive sweating during exercise. After that, feel free to carry on with regular activities. Keep soaps and lotions away from the ZIO XT Patch.   Someone will contact you to have monitor mailed.     Follow-Up: At [facility], you and your health needs are our priority.  As part of our continuing mission to provide you with exceptional heart care, we have created designated Provider Care Teams.  These Care Teams include your primary Cardiologist (physician) and Advanced Practice Providers (APPs -  Physician Assistants and Nurse Practitioners) who all work together to provide you with the care you need, when you need it.  Your next appointment:   3 months  The format for your next appointment:   In Person  Provider:   Buford Dresser, MD

## 2019-08-27 NOTE — Telephone Encounter (Signed)
Left message to call back  Going to offer appointment with Dr Harrell Gave this afternoon, she currently has open appiontment

## 2019-08-30 DIAGNOSIS — J019 Acute sinusitis, unspecified: Secondary | ICD-10-CM | POA: Diagnosis not present

## 2019-08-30 DIAGNOSIS — R197 Diarrhea, unspecified: Secondary | ICD-10-CM | POA: Diagnosis not present

## 2019-08-30 DIAGNOSIS — R112 Nausea with vomiting, unspecified: Secondary | ICD-10-CM | POA: Diagnosis not present

## 2019-09-04 ENCOUNTER — Telehealth: Payer: Self-pay | Admitting: *Deleted

## 2019-09-04 NOTE — Telephone Encounter (Signed)
3 day ZIO XT long term holter monitor to be mailed to the patients home.  Instructions reviewed briefly as they are included in the monitor kit. 

## 2019-09-05 ENCOUNTER — Ambulatory Visit: Payer: BLUE CROSS/BLUE SHIELD | Admitting: Cardiology

## 2019-09-08 ENCOUNTER — Ambulatory Visit (INDEPENDENT_AMBULATORY_CARE_PROVIDER_SITE_OTHER): Payer: BC Managed Care – PPO

## 2019-09-08 DIAGNOSIS — R002 Palpitations: Secondary | ICD-10-CM | POA: Diagnosis not present

## 2019-09-19 ENCOUNTER — Telehealth (INDEPENDENT_AMBULATORY_CARE_PROVIDER_SITE_OTHER): Payer: BC Managed Care – PPO | Admitting: Family Medicine

## 2019-09-19 DIAGNOSIS — R002 Palpitations: Secondary | ICD-10-CM

## 2019-09-19 DIAGNOSIS — I10 Essential (primary) hypertension: Secondary | ICD-10-CM

## 2019-09-19 DIAGNOSIS — R931 Abnormal findings on diagnostic imaging of heart and coronary circulation: Secondary | ICD-10-CM | POA: Diagnosis not present

## 2019-09-19 DIAGNOSIS — J302 Other seasonal allergic rhinitis: Secondary | ICD-10-CM | POA: Diagnosis not present

## 2019-09-19 MED ORDER — FLUTICASONE PROPIONATE 50 MCG/ACT NA SUSP: 1.0000 | Freq: Every day | NASAL | 2 refills | Status: DC

## 2019-09-19 MED ORDER — LOSARTAN POTASSIUM 25 MG PO TABS: 25.0000 mg | ORAL_TABLET | Freq: Every day | ORAL | 3 refills | Status: DC

## 2019-09-19 NOTE — Progress Notes (Signed)
Virtual Visit via Video Note  I connected with Dustin [name] on 09/19/19 at  3:30 PM EST by a video enabled telemedicine application 2/2 [id number] pandemic and verified that I am speaking with the correct person using two identifiers.  Location patient: home Location provider:work or home office Persons participating in the virtual visit: patient, provider  I discussed the limitations of evaluation and management by telemedicine and the availability of in person appointments. The patient expressed understanding and agreed to proceed.   HPI: Feels like had a sore throat off and on since March.  States the feeling comes and goes.  Pt's wife now having similar sore throat.  Having carpet removed and has 4 dogs at home.  Pt also notes increased HAs for the last wk that starts when driving to work. Starts at the base of his neck then moves up head.  Pt also feeling more palpitations.  Had a holter monitor, awaiting report from Cardiology to determine if meds are needed.  Pt notices pain between shoulder blades for the last wk.  Today does have a HA and throat feels better, took claritin this am.    99/72 P 91  142/77 P 86.  Sometimes checks both arms  Recently seen by UC, told had "fluid in his ear", started on abx and steroid. Had negative COVID test at that time.  Family hx: Older sis and middle have palpitations and on meds  ROS: See pertinent positives and negatives per HPI.  Past Medical History:  Diagnosis Date  . GERD (gastroesophageal reflux disease)    occasionally with no meds  . Hypertension   . 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 CHOLECYSTECTOMY  2003  . UMBILICAL HERNIA REPAIR  2003  . WISDOM TOOTH EXTRACTION      Family History  Problem Relation Age of Onset  . Hypertension Mother   . Cancer Sister        melanoma  . CAD Father        CABG at age 79-60  . Heart disease Sister        heart attack 11/2010 -  stents placed at age 64-60  . Colon cancer Neg Hx   . Colon polyps Neg Hx   . Rectal cancer Neg Hx   . Stomach cancer Neg Hx       Current Outpatient Medications:  .  acetaminophen (TYLENOL) 500 MG tablet, Take 1,000 mg by mouth every 6 (six) hours as needed for headache (pain)., Disp: , Rfl:  .  atorvastatin (LIPITOR) 20 MG tablet, Take 1 tablet (20 mg total) by mouth daily at 6 PM., Disp: 90 tablet, Rfl: 3 .  fluticasone (FLONASE) 50 MCG/ACT nasal spray, Place 1 spray into both nostrils daily., Disp: 16 g, Rfl: 2 .  losartan (COZAAR) 25 MG tablet, Take 1 tablet (25 mg total) by mouth daily., Disp: 30 tablet, Rfl: 3 .  Multiple Vitamin (MULTIVITAMIN WITH MINERALS) TABS tablet, Take 1 tablet by mouth daily., Disp: , Rfl:   EXAM:  VITALS per patient if applicable:  GENERAL: alert, oriented, appears well and in no acute distress  HEENT: atraumatic, conjunctiva clear, no obvious abnormalities on inspection of external nose and ears  NECK: normal movements of the head and neck  LUNGS: on inspection no signs of respiratory distress, breathing rate appears normal, no obvious gross SOB, gasping or wheezing  CV: no obvious cyanosis  MS: moves all visible extremities without noticeable  abnormality  PSYCH/NEURO: pleasant and cooperative, no obvious depression or anxiety, speech and thought processing grossly intact  ASSESSMENT AND PLAN:  Discussed the following assessment and plan:  Seasonal allergies  -likely contributing to sore throat/irritaiton -consider allergy pill such as Claritin daily.   - Plan: fluticasone (FLONASE) 50 MCG/ACT nasal spray  Palpitations -continue f/u with Cardiology -avoid caffeine  Essential hypertension  -given continued cough will d/c lisinopril-hctz 20-12.5 mg  -will start losartan -pt to continue monitoring bp at home. 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 - Plan: Ambulatory referral to Pulmonology  F/u in 1 month   I discussed the assessment and treatment plan with the patient. The patient was provided an opportunity to ask questions and all were answered. The patient agreed with the plan and demonstrated an understanding of the instructions.   The patient was advised to call back or seek an in-person evaluation if the symptoms worsen or if the condition fails to improve as anticipated.   [name], MD

## 2019-09-20 ENCOUNTER — Telehealth: Payer: BC Managed Care – PPO | Admitting: Family Medicine

## 2019-09-22 DIAGNOSIS — R002 Palpitations: Secondary | ICD-10-CM | POA: Diagnosis not present

## 2019-09-25 ENCOUNTER — Telehealth: Payer: Self-pay | Admitting: Family Medicine

## 2019-09-25 NOTE — Telephone Encounter (Signed)
Spoke with patient . Patient verbalized he started onLosartan Sunday and took yesterday. Felt good Sunday and yesterday. He checked his BP  Monday afternoon  144/81(left arm) and right 132/82. Then rechecked 135/77 ,  127/85. This morning his BP was 167/88. Patienteports when he got up to go to the bathroom he almost fell over d/t dizziness. He verbalizes his BP was 130/77 a few minutes ago. Patient reports when he gets up he sits up on bed for 2-5 seconds as opposed to 30 seconds to 1 minute.  Please advise

## 2019-09-25 NOTE — Telephone Encounter (Signed)
The lisinopril was d/c 2/2 pt's cough and the losartan was started.  In regards to his potassium, pt was never advised to take both the lisinopril and losartan together.

## 2019-09-25 NOTE — Telephone Encounter (Signed)
Spoke with patient. Informed him per Dr. Volanda Napoleon Overall bp readings improved.  Note one elevated reading, was that prior to taking medication?  Would advise pt to continue losartan as pt has only taken the med 2 days, increase po intake of water and fluids.  Pt should also sit on the side of the bed as opposed to immediately jumping out of bed.  Pt should continue checking bp once or twice a day for the next 2 wks.  11/16: 144/81 (had taken medication) (right arm) 132/82  11/17 : 167/88 this am (did not take medication).   Patient now c/o headache above left side of head. Has taken Tylenol. Also c/o diarrhea.   Patient read insert  And noticed it said do not take Lisinopril and Losartan together.  Also read where it can raise Potassium." I dont want to stay on this living with dizziness, I rather deal with the cough from lisinopril." Patient verbalized he read that he can have dizziness until his body gets use to the medication. Patient is concerned about his potassium level. Patient wants to know can he wait to see what the cardiologist says in regards to his palpitations. Before he starts back on the Losartan.

## 2019-09-25 NOTE — Telephone Encounter (Signed)
Overall bp readings improved.  Note one elevated reading, was that prior to taking medication?  Would advise pt to continue losartan as pt has only taken the med 2 days, increase po intake of water and fluids.  Pt should also sit on the side of the bed as opposed to immediately jumping out of bed.  Pt should continue checking bp once or twice a day for the next 2 wks.

## 2019-09-25 NOTE — Telephone Encounter (Signed)
losartan (COZAAR) 25 MG tablet Pt called to report that he stopped taking this because it was raising his BP, he reported dizziness while taking it with elevated pressures. He is asking for feedback from provider.  [phone]

## 2019-09-28 ENCOUNTER — Telehealth: Payer: Self-pay | Admitting: Cardiology

## 2019-09-28 NOTE — Telephone Encounter (Signed)
Patient calling for monitor results.  I did not see it was reviewed yet, will route to advise.

## 2019-09-28 NOTE — Telephone Encounter (Signed)
New message:    Patient would like for someone to call him concerning some results. Please call patient.

## 2019-09-30 NOTE — Telephone Encounter (Signed)
Monitor results reviewed. His symptoms are largely related to early beats (mostly PVCs, occasional PACs and rare/brief SVT). His overall PVC burden is below the 20% of all beat threshold that we worry about it affecting the squeeze of the heart. Does he feel better or worse when he exercises? There are medications we can try, though they can have occasional side effects. It's ok to monitor and not treat for now, but I'm also happy to do an in person or virtual visit to discuss further. Thanks.

## 2019-10-01 NOTE — Telephone Encounter (Signed)
Pt updated with monitor report and voiced he would like to discuss monitor report and BP medication with MD.  Appointment scheduled for 11/25

## 2019-10-03 ENCOUNTER — Other Ambulatory Visit: Payer: Self-pay | Admitting: Family Medicine

## 2019-10-03 ENCOUNTER — Encounter: Payer: Self-pay | Admitting: Cardiology

## 2019-10-03 ENCOUNTER — Ambulatory Visit (INDEPENDENT_AMBULATORY_CARE_PROVIDER_SITE_OTHER): Payer: BC Managed Care – PPO | Admitting: Cardiology

## 2019-10-03 ENCOUNTER — Other Ambulatory Visit: Payer: Self-pay

## 2019-10-03 VITALS — BP 125/74 | HR 79 | Ht 73.0 in | Wt 187.8 lb

## 2019-10-03 DIAGNOSIS — I471 Supraventricular tachycardia, unspecified: Secondary | ICD-10-CM | POA: Insufficient documentation

## 2019-10-03 DIAGNOSIS — I10 Essential (primary) hypertension: Secondary | ICD-10-CM

## 2019-10-03 DIAGNOSIS — I493 Ventricular premature depolarization: Secondary | ICD-10-CM

## 2019-10-03 DIAGNOSIS — Z712 Person consulting for explanation of examination or test findings: Secondary | ICD-10-CM | POA: Diagnosis not present

## 2019-10-03 DIAGNOSIS — Z87898 Personal history of other specified conditions: Secondary | ICD-10-CM

## 2019-10-03 NOTE — Patient Instructions (Addendum)
Medication Instructions:  Ok to return to prior lisinopril--HCTZ dose instead of losartan  *If you need a refill on your cardiac medications before your next appointment, please call your pharmacy*  Lab Work: None  Testing/Procedures: None  Follow-Up: At [facility], you and your health needs are our priority.  As part of our continuing mission to provide you with exceptional heart care, we have created designated Provider Care Teams.  These Care Teams include your primary Cardiologist (physician) and Advanced Practice Providers (APPs -  Physician Assistants and Nurse Practitioners) who all work together to provide you with the care you need, when you need it.  Your next appointment:   1 year  The format for your next appointment:   In Person  Provider:   You may see Buford Dresser, MD or one of the following Advanced Practice Providers on your designated Care Team:    Rosaria Ferries, PA-C  Jory Sims, DNP, ANP  Cadence Kathlen Mody, NP

## 2019-10-03 NOTE — Telephone Encounter (Signed)
Requested medication (s) are due for refill today - yes   Requested medication (s) are on the active medication list -yes  Future visit scheduled -yes  Last refill: historical Rx  Notes to clinic: Patient is requesting historical Rx- meets RF criteria  Requested Prescriptions  Pending Prescriptions Disp Refills   lisinopril-hydrochlorothiazide (ZESTORETIC) 20-12.5 MG tablet       Sig: Take 0.5 tablets by mouth daily.     Cardiovascular:  ACEI + Diuretic Combos Passed - 10/03/2019  3:36 PM      Passed - Na in normal range and within 180 days    Sodium  Date Value Ref Range Status  04/20/2019 139 135 - 145 mmol/L Final         Passed - K in normal range and within 180 days    Potassium  Date Value Ref Range Status  04/20/2019 4.1 3.5 - 5.1 mmol/L Final         Passed - Cr in normal range and within 180 days    Creatinine, Ser  Date Value Ref Range Status  04/20/2019 1.13 0.61 - 1.24 mg/dL Final         Passed - Ca in normal range and within 180 days    Calcium  Date Value Ref Range Status  04/20/2019 9.2 8.9 - 10.3 mg/dL Final         Passed - Patient is not pregnant      Passed - Last BP in normal range    BP Readings from Last 1 Encounters:  10/03/19 125/74         Passed - Valid encounter within last 6 months    Recent Outpatient Visits          2 weeks ago Seasonal allergies   [place] [facility] [place], MD   11 months ago Well adult exam   Therap[facility], [facility] at [facility], Langley Adie, MD   1 year ago Essential hypertension   [facility]rap[facility], [facility] at [facility], [place]y Adie, MD   1 year ago Essential hypertension   Therapist, [facility] at [facility], [place] Adie, MD   1 year ago Subacute maxillary sinusitis   Therapist, music at [place], MD      Future Appointments            In 4 weeks Billie Ruddy, MD [place] [facility] [place], [facility]              Requested Prescriptions   Pending Prescriptions Disp Refills   lisinopril-hydrochlorothiazide (ZESTORETIC) 20-12.5 MG tablet       Sig: Take 0.5 tablets by mouth daily.     Cardiovascular:  ACEI + Diuretic Combos Passed - 10/03/2019  3:36 PM      Passed - Na in normal range and within 180 days    Sodium  Date Value Ref Range Status  04/20/2019 139 135 - 145 mmol/L Final         Passed - K in normal range and within 180 days    Potassium  Date Value Ref Range Status  04/20/2019 4.1 3.5 - 5.1 mmol/L Final         Passed - Cr in normal range and within 180 days    Creatinine, Ser  Date Value Ref Range Status  04/20/2019 1.13 0.61 - 1.24 mg/dL Final         Passed - Ca in normal range and within 180 days    Calcium  Date Value Ref Range Status  04/20/2019 9.2 8.9 - 10.3 mg/dL Final         Passed - Patient is not pregnant      Passed - Last BP in normal range    BP Readings from Last 1 Encounters:  10/03/19 125/74         Passed - Valid encounter within last 6 months    Recent Outpatient Visits          2 weeks ago Seasonal allergies   [facility], Bettey Mare, MD   11 months ago Well adult exam   [facility] officer at [facility], Bettey Mare, MD   1 year ago Essential hypertension   [facility] conservation officer at [facility], Bettey Mare, MD   1 year ago Essential hypertension   [facility] conservation officer at [facility], Bettey Mare, MD   1 year ago Subacute maxillary sinusitis   [facility] conservation officer at [facility], Bettey Mare, MD      Future Appointments            In 4 weeks Salomon Fick, Bettey Mare, MD [facility] [place], [facility]

## 2019-10-03 NOTE — Telephone Encounter (Signed)
Copied from [place] [id number]. Topic: Quick Communication - Rx Refill/Question >> Oct 03, 2019  3:27 PM Yvette Rack wrote: Medication: lisinopril-hydrochlorothiazide (ZESTORETIC) 20-12.5 MG tablet  Has the patient contacted their pharmacy? yes   Preferred Pharmacy (with phone number or street name): [id number] - [place], [place] - [address] [phone] (Phone)  [phone] (Fax)  Agent: Please be advised that RX refills may take up to 3 business days. We ask that you follow-up with your pharmacy.

## 2019-10-03 NOTE — Progress Notes (Signed)
Cardiology Office Note:    Date:  10/03/2019   ID:  Dustin [name], DOB Mar 21, 1961, MRN [id number]  PCP:  Billie Ruddy, MD  Cardiologist:  Buford Dresser, MD PhD  Referring MD: Billie Ruddy, MD   CC: follow up to discuss monitor results.  History of Present Illness:    Dustin [name] is a 58 y.o. male with a hx of HTN, strong FH of CAD who is seen for post hospital follow up. I met him in the hospital on 04/19/19 during an [place] for chest pain.  Cardiac history: longstanding chest pain, normal myoview 2017. Ct coronary 04/2019 during admission for chest pain with calcium score of 0, normal cors, no evidence of CAD. PA measured 37 mm. CT angio chest/dissection done the day prior was unremarkable.  Today: Reviewed monitor results with him today. Showed images from Zio report. Pointed out SVT (very brief) and PVCs.   Checked home BP cuff against our cuffs, agrees. Notes HR bounces between 60-90s predominantly.  Last night had severe chest tightness for about 30 minutes. Was sitting on the couch. No other associated symptoms. Noted that once chest tightness subsided, his heart rate was a steady 60 bpm the rest of the night.   Has daily severe headaches. Also has positional vertigo sensation.   Switched from lisinopril to losartan due to cough. Got up on day 3 of losartan to use the restroom, felt very dizzy. Went downstairs, BP was 342 systolic. Took lisinopril that day instead of losartan, that evening BP had normalized. Since then has been 876O systolic, resumed lisinopril-HCTZ combo pill. Has mild cough in AM only, not that bothersome to him. Prefers to be on lisinopril vs losartan, ok with me.  Denies shortness of breath at rest or with normal exertion. No PND, orthopnea, LE edema or unexpected weight gain. No syncope.  Past Medical History:  Diagnosis Date  . GERD (gastroesophageal reflux disease)    occasionally with no meds  . Hypertension   . ORTHOSTATIC  DIZZINESS 04/29/2008    Past Surgical History:  Procedure Laterality Date  . CHOLECYSTECTOMY  2003  . UMBILICAL HERNIA REPAIR  2003  . WISDOM TOOTH EXTRACTION      Current Medications: Current Outpatient Medications on File Prior to Visit  Medication Sig  . acetaminophen (TYLENOL) 500 MG tablet Take 1,000 mg by mouth every 6 (six) hours as needed for headache (pain).  Marland Kitchen atorvastatin (LIPITOR) 20 MG tablet Take 20 mg by mouth as needed.  . fluticasone (FLONASE) 50 MCG/ACT nasal spray Place 1 spray into both nostrils daily.  Marland Kitchen lisinopril-hydrochlorothiazide (ZESTORETIC) 20-12.5 MG tablet Take 0.5 tablets by mouth daily.  . Multiple Vitamin (MULTIVITAMIN WITH MINERALS) TABS tablet Take 1 tablet by mouth daily.   No current facility-administered medications on file prior to visit.      Allergies:   Novocain [procaine]   Social History   Tobacco Use  . Smoking status: Never Smoker  . Smokeless tobacco: Never Used  Substance Use Topics  . Alcohol use: Yes    Comment: occ beer  . Drug use: No    Family History: The patient's family history includes CAD in his father; Cancer in his sister; Heart disease in his sister; Hypertension in his mother. There is no history of Colon cancer, Colon polyps, Rectal cancer, or Stomach cancer.  ROS:   Please see the history of present illness.  Additional pertinent ROS: Constitutional: Negative for chills, fever, night sweats, unintentional weight loss  HENT: Negative for ear pain and hearing loss.  Positive for positional vertigo Eyes: Negative for loss of vision and eye pain.   Respiratory: Negative for cough, sputum, wheezing.   Cardiovascular: See HPI. Gastrointestinal: Negative for abdominal pain, melena, and hematochezia.  Genitourinary: Negative for dysuria and hematuria.  Musculoskeletal: Negative for falls and myalgias.  Skin: Negative for itching and rash.  Neurological: Negative for focal weakness, focal sensory changes and loss  of consciousness. Positive for headaches Endo/Heme/Allergies: Does not bruise/bleed easily.   EKGs/Labs/Other Studies Reviewed:    The following studies were reviewed today: Exercise nuclear stress 08/26/2016 CT coronary 04/20/19  Monitor 09/2019 3 days of data recorded on Zio monitor. Patient had a min HR of 53 bpm, max HR of 182 bpm, and avg HR of 96 bpm. Predominant underlying rhythm was Sinus Rhythm. No VT, atrial fibrillation, high degree block, or pauses noted. Isolated atrial ectopy was rare (<1%). PVCs were frequent (6.4%), with rare couplets/triplets and rare bigeminy/trigeminy. There were 35 triggered events, which were sinus rhythm with ectopy. 6 brief Supraventricular Tachycardia runs occurred, the run with the fastest interval lasting 6 beats with a max rate of 182 bpm, the longest lasting 9 beats with an avg rate of 137 bpm.  EKG:  EKG is personally reviewed.  The ekg ordered 08/27/19 demonstrates NSR  Recent Labs: 04/19/2019: ALT 19; Magnesium 2.1; TSH 0.891 04/20/2019: BUN 17; Creatinine, Ser 1.13; Hemoglobin 14.3; Platelets 268; Potassium 4.1; Sodium 139  Recent Lipid Panel    Component Value Date/Time   CHOL 174 04/20/2019 0425   TRIG 67 04/20/2019 0425   HDL 55 04/20/2019 0425   CHOLHDL 3.2 04/20/2019 0425   VLDL 13 04/20/2019 0425   LDLCALC 106 (H) 04/20/2019 0425    Physical Exam:    VS:  BP 125/74   Pulse 79   Ht _0  (1.854 m)   Wt 187 lb 12.8 oz (85.2 kg)   SpO2 98%   BMI 24.78 kg/m     Wt Readings from Last 3 Encounters:  10/03/19 187 lb 12.8 oz (85.2 kg)  08/27/19 188 lb 6.4 oz (85.5 kg)  05/15/19 192 lb (87.1 kg)    GEN: Well nourished, well developed in no acute distress HEENT: Normal, moist mucous membranes NECK: No JVD CARDIAC: regular rhythm, normal S1 and S2, no rubs or gallops. No murmur. VASCULAR: Radial and DP pulses 2+ bilaterally. No carotid bruits RESPIRATORY:  Clear to auscultation without rales, wheezing or rhonchi  ABDOMEN: Soft,  non-tender, non-distended MUSCULOSKELETAL:  Ambulates independently SKIN: Warm and dry, no edema NEUROLOGIC:  Alert and oriented x 3. No focal neuro deficits noted. PSYCHIATRIC:  Normal affect   ASSESSMENT:    1. Encounter to discuss test results   2. PVC (premature ventricular contraction)   3. Paroxysmal SVT (supraventricular tachycardia) ([place])   4. Essential hypertension   5. History of chest pain    PLAN:    Palpitations: discussed monitor results, which show rare/brief paroxysmal SVT and 6% PVC burden -discussed report at length today, reviewed today -discussed options for management. He feels better knowing what it is, does not wish to trial medical management at this point -counseled on red flag warning signs  Hypertension: normotensive today -continue lisinopril-HCTZ (half of 20-12.5 mg tab), ok from my perspective despite mild cough. Unclear if losartan was the cause of his symptoms the morning he describes, but he is adamant that he will not take losartan based on how he felt. -home cuff agrees with  office cuff  History of Chest pain:  CT coronary without plaque or calcium -despite low risk CT, has family history. Continue atorvastatin 20 mg -counseled on red flag warning signs. For instance, discussed that severe episode he had last night of chest tightness would be an example of a reason to be evaluated at least with blood work and an ECG in the ER. He is fine now, no reason to go at this time. While it is unlikely to be the heart, a clean CT is not a promise of no future events (though the probability overall is low).  Cardiac risk counseling and prevention recommendations: -recommend heart healthy/Mediterranean diet, with whole grains, fruits, vegetable, fish, lean meats, nuts, and olive oil. Limit salt. -recommend moderate walking, 3-5 times/week for 30-50 minutes each session. Aim for at least 150 minutes.week. Goal should be pace of 3 miles/hours, or walking 1.5 miles  in 30 minutes -recommend avoidance of tobacco products. Avoid excess alcohol. -ASCVD risk score: The 10-year ASCVD risk score Mikey Bussing DC Brooke Bonito., et al., 2013) is: 7.9%   Values used to calculate the score:     Age: 26 years     Sex: Male     Is Non-Hispanic African American: No     Diabetic: No     Tobacco smoker: No     Systolic Blood Pressure: 010 mmHg     Is BP treated: Yes     HDL Cholesterol: 55 mg/dL     Total Cholesterol: 174 mg/dL    Plan for follow up: 1 year or sooner PRN  TIME SPENT WITH PATIENT: 42 minutes of direct patient care. More than 50% of that time was spent on coordination of care and counseling regarding specific review of test results, education, and recommendations.Buford Dresser, MD, PhD [place]  [facility]   Medication Adjustments/Labs and Tests Ordered: Current medicines are reviewed at length with the patient today.  Concerns regarding medicines are outlined above.  No orders of the defined types were placed in this encounter.  No orders of the defined types were placed in this encounter.   Patient Instructions  Medication Instructions:  Ok to return to prior lisinopril--HCTZ dose instead of losartan  *If you need a refill on your cardiac medications before your next appointment, please call your pharmacy*  Lab Work: None  Testing/Procedures: None  Follow-Up: At [facility], you and your health needs are our priority.  As part of our continuing mission to provide you with exceptional heart care, we have created designated Provider Care Teams.  These Care Teams include your primary Cardiologist (physician) and Advanced Practice Providers (APPs -  Physician Assistants and Nurse Practitioners) who all work together to provide you with the care you need, when you need it.  Your next appointment:   1 year  The format for your next appointment:   In Person  Provider:   You may see Buford Dresser, MD or one of the  following Advanced Practice Providers on your designated Care Team:    Rosaria Ferries, PA-C  Jory Sims, DNP, ANP  Cadence Kathlen Mody, NP        Signed, Buford Dresser, MD PhD 10/03/2019 12:57 PM    [place]

## 2019-10-15 ENCOUNTER — Other Ambulatory Visit: Payer: Self-pay | Admitting: Family Medicine

## 2019-10-15 MED ORDER — LISINOPRIL-HYDROCHLOROTHIAZIDE 20-12.5 MG PO TABS: 0.5000 | ORAL_TABLET | Freq: Every day | ORAL | 0 refills | Status: DC

## 2019-10-15 NOTE — Telephone Encounter (Signed)
Request forwarded to wrong office- apologies.

## 2019-10-31 ENCOUNTER — Other Ambulatory Visit: Payer: Self-pay

## 2019-11-01 ENCOUNTER — Encounter: Payer: Self-pay | Admitting: Family Medicine

## 2019-11-01 ENCOUNTER — Ambulatory Visit (INDEPENDENT_AMBULATORY_CARE_PROVIDER_SITE_OTHER): Payer: BC Managed Care – PPO | Admitting: Family Medicine

## 2019-11-01 VITALS — BP 138/86 | HR 92 | Temp 97.7°F | Wt 188.0 lb

## 2019-11-01 DIAGNOSIS — Z125 Encounter for screening for malignant neoplasm of prostate: Secondary | ICD-10-CM

## 2019-11-01 DIAGNOSIS — E782 Mixed hyperlipidemia: Secondary | ICD-10-CM | POA: Diagnosis not present

## 2019-11-01 DIAGNOSIS — M7702 Medial epicondylitis, left elbow: Secondary | ICD-10-CM | POA: Diagnosis not present

## 2019-11-01 DIAGNOSIS — Z0000 Encounter for general adult medical examination without abnormal findings: Secondary | ICD-10-CM

## 2019-11-01 DIAGNOSIS — M67432 Ganglion, left wrist: Secondary | ICD-10-CM

## 2019-11-01 DIAGNOSIS — I10 Essential (primary) hypertension: Secondary | ICD-10-CM | POA: Diagnosis not present

## 2019-11-01 LAB — LIPID PANEL
Cholesterol: 193 mg/dL (ref 0–200)
HDL: 53.9 mg/dL (ref >39.00)
LDL Cholesterol: 115 mg/dL — ABNORMAL HIGH (ref 0–99)
NonHDL: 139.45
Total CHOL/HDL Ratio: 4
Triglycerides: 124 mg/dL (ref 0.0–149.0)
VLDL: 24.8 mg/dL (ref 0.0–40.0)

## 2019-11-01 LAB — BASIC METABOLIC PANEL
BUN: 18 mg/dL (ref 6–23)
CO2: 29 mEq/L (ref 19–32)
Calcium: 9.8 mg/dL (ref 8.4–10.5)
Chloride: 101 mEq/L (ref 96–112)
Creatinine, Ser: 1.14 mg/dL (ref 0.40–1.50)
GFR: 65.47 mL/min (ref >60.00)
Glucose, Bld: 106 mg/dL — ABNORMAL HIGH (ref 70–99)
Potassium: 4.9 mEq/L (ref 3.5–5.1)
Sodium: 137 mEq/L (ref 135–145)

## 2019-11-01 LAB — CBC WITH DIFFERENTIAL/PLATELET
Basophils Absolute: 0 10*3/uL (ref 0.0–0.1)
Basophils Relative: 0.5 % (ref 0.0–3.0)
Eosinophils Absolute: 0.1 10*3/uL (ref 0.0–0.7)
Eosinophils Relative: 1.2 % (ref 0.0–5.0)
HCT: 43.3 % (ref 39.0–52.0)
Hemoglobin: 14.9 g/dL (ref 13.0–17.0)
Lymphocytes Relative: 22.9 % (ref 12.0–46.0)
Lymphs Abs: 1.6 10*3/uL (ref 0.7–4.0)
MCHC: 34.3 g/dL (ref 30.0–36.0)
MCV: 91.3 fl (ref 78.0–100.0)
Monocytes Absolute: 0.5 10*3/uL (ref 0.1–1.0)
Monocytes Relative: 7.6 % (ref 3.0–12.0)
Neutro Abs: 4.7 10*3/uL (ref 1.4–7.7)
Neutrophils Relative %: 67.8 % (ref 43.0–77.0)
Platelets: 302 10*3/uL (ref 150.0–400.0)
RBC: 4.74 Mil/uL (ref 4.22–5.81)
RDW: 13.1 % (ref 11.5–15.5)
WBC: 6.9 10*3/uL (ref 4.0–10.5)

## 2019-11-01 LAB — PSA: PSA: 0.41 ng/mL (ref 0.10–4.00)

## 2019-11-01 NOTE — Progress Notes (Signed)
Subjective:     Dustin [name] is a 58 y.o. male and is here for a comprehensive physical exam. The patient reports problems - L elbow pain, bump on L wrist.  Pt with L elbow pain for several days.  Pt denies erythema.  Pain at olecranon process and medial epicondyle.  Pt denies repetitive movements or injury.  Pt also with bump on dorsum of L wrist, painful at times.  Denies injury, erythema, or edema.  Since last OFV, pt tried losartan but noted an episode of dizziness in the middle of the night.  Pt has since restarted half a tab lisinopril-hctz 20-12.5 mg.  BP ha sbeen stable 128/78, 147/82, 129/72, 113/77, 132/83, 123/78, 117/77.  Pt having less cough cough/tickle in throat since restarting lisinopril.  During recent follow-up with cardiology home BP cuff reading was similar to in office reading.  Pt also having less palpitations since wearing Holter monitor.  Pt stopped taking Lipitor 20 mg as wants to see if lifestyle modifications will improve cholesterol.  Social History   Socioeconomic History  . Marital status: Married    Spouse name: Not on file  . Number of children: Not on file  . Years of education: Not on file  . Highest education level: Not on file  Occupational History  . Occupation: Theme park manager: [employer]  Tobacco Use  . Smoking status: Never Smoker  . Smokeless tobacco: Never Used  Substance and Sexual Activity  . Alcohol use: Yes    Comment: occ beer  . Drug use: No  . Sexual activity: Yes  Other Topics Concern  . Not on file  Social History Narrative   Lives in [place] with his wife.   Social Determinants of Health   Financial Resource Strain:   . Difficulty of Paying Living Expenses: Not on file  Food Insecurity:   . Worried About Programme researcher, broadcasting/film/video in the Last Year: Not on file  . Ran Out of Food in the Last Year: Not on file  Transportation Needs:   . Lack of Transportation (Medical): Not on file  . Lack of  Transportation (Non-Medical): Not on file  Physical Activity:   . Days of Exercise per Week: Not on file  . Minutes of Exercise per Session: Not on file  Stress:   . Feeling of Stress : Not on file  Social Connections:   . Frequency of Communication with Friends and Family: Not on file  . Frequency of Social Gatherings with Friends and Family: Not on file  . Attends Religious Services: Not on file  . Active Member of Clubs or Organizations: Not on file  . Attends Banker Meetings: Not on file  . Marital Status: Not on file  Intimate Partner Violence:   . Fear of Current or Ex-Partner: Not on file  . Emotionally Abused: Not on file  . Physically Abused: Not on file  . Sexually Abused: Not on file   Health Maintenance  Topic Date Due  . COLONOSCOPY  10/29/2027  . TETANUS/TDAP  11/27/2028  . INFLUENZA VACCINE  Completed  . Hepatitis C Screening  Completed  . HIV Screening  Completed    The following portions of the patient's history were reviewed and updated as appropriate: allergies, current medications, past family history, past medical history, past social history, past surgical history and problem list.  Review of Systems Pertinent items noted in HPI and remainder of comprehensive ROS otherwise negative.  Objective:    BP 138/86 (BP Location: Left Arm, Patient Position: Sitting, Cuff Size: Normal)   Pulse 92   Temp 97.7 F (36.5 C) (Temporal)   Wt 188 lb (85.3 kg)   SpO2 98%   BMI 24.80 kg/m  General appearance: alert, cooperative and no distress Head: Normocephalic, without obvious abnormality, atraumatic Eyes: conjunctivae/corneas clear. PERRL, EOM's intact. Fundi benign. Ears: normal TM's and external ear canals both ears Nose: Nares normal. Septum midline. Mucosa normal. No drainage or sinus tenderness. Throat: lips, mucosa, and tongue normal; teeth and gums normal Neck: moderate anterior cervical adenopathy, no adenopathy, no carotid bruit, no JVD,  supple, symmetrical, trachea midline and thyroid not enlarged, symmetric, no tenderness/mass/nodules Lungs: clear to auscultation bilaterally Heart: regular rate and rhythm, S1, S2 normal, no murmur, click, rub or gallop Abdomen: soft, non-tender; bowel sounds normal; no masses,  no organomegaly Extremities: extremities normal, atraumatic, no cyanosis or edema.  TTP of L medial epicondyle. no TTP of lateral epicondyle  Negative Tinnel's and Phalen's.   Pulses: 2+ and symmetric Skin: Skin color, texture, turgor normal. No rashes.  L dorsal wrist with mobile, slightly firm nodule, without TTP.  No erythema or drainage noted. Lymph nodes: Cervical, supraclavicular, and axillary nodes normal. Neurologic: Alert and oriented X 3, normal strength and tone. Normal symmetric reflexes. Normal coordination and gait    Assessment:    Healthy male exam with epicondylitis and ganglion cyst of L wrist.     Plan:     Anticipatory guidance given including wearing seatbelts, smoke detectors in the home, increasing physical activity, increasing p.o. intake of water and vegetables. -will obtain labs -Colonoscopy up-to-date.  Done 10/28/2017 -Given handouts -Next CPE in 1 year See After Visit Summary for Counseling Recommendations    Mixed hyperlipidemia  -Continue lifestyle modifications - Plan: Lipid Panel  Essential hypertension  -Controlled -Continue lisinopril-hydrochlorothiazide 20-12.5 mg take half tab daily -Continue lifestyle modifications -Continue checking BP at home regularly. - Plan: Basic Metabolic Panel  Ganglion cyst of wrist, left -Discussed treatment options.  Advised cyst may return after removal. -Given handout -We will refer to Ortho or hand surgery for worsening symptoms/ removal if needed.  Medial epicondylitis of left elbow  -Discussed supportive care including rest, ice, Aspercreme, NSAIDs as needed -Given handouts - Plan: CBC with Differential/Platelet  Screening for  prostate cancer - Plan: PSA  Follow-up as needed in the next 3 to 4 months.  Grier Mitts, MD

## 2019-11-01 NOTE — Patient Instructions (Signed)
Preventive Care 41-60 Years Old, Male Preventive care refers to lifestyle choices and visits with your health care provider that can promote health and wellness. This includes:  A yearly physical exam. This is also called an annual well check.  Regular dental and eye exams.  Immunizations.  Screening for certain conditions.  Healthy lifestyle choices, such as eating a healthy diet, getting regular exercise, not using drugs or products that contain nicotine and tobacco, and limiting alcohol use. What can I expect for my preventive care visit? Physical exam Your health care provider will check:  Height and weight. These may be used to calculate body mass index (BMI), which is a measurement that tells if you are at a healthy weight.  Heart rate and blood pressure.  Your skin for abnormal spots. Counseling Your health care provider may ask you questions about:  Alcohol, tobacco, and drug use.  Emotional well-being.  Home and relationship well-being.  Sexual activity.  Eating habits.  Work and work Statistician. What immunizations do I need?  Influenza (flu) vaccine  This is recommended every year. Tetanus, diphtheria, and pertussis (Tdap) vaccine  You may need a Td booster every 10 years. Varicella (chickenpox) vaccine  You may need this vaccine if you have not already been vaccinated. Zoster (shingles) vaccine  You may need this after age 64. Measles, mumps, and rubella (MMR) vaccine  You may need at least one dose of MMR if you were born in 1957 or later. You may also need a second dose. Pneumococcal conjugate (PCV13) vaccine  You may need this if you have certain conditions and were not previously vaccinated. Pneumococcal polysaccharide (PPSV23) vaccine  You may need one or two doses if you smoke cigarettes or if you have certain conditions. Meningococcal conjugate (MenACWY) vaccine  You may need this if you have certain conditions. Hepatitis A  vaccine  You may need this if you have certain conditions or if you travel or work in places where you may be exposed to hepatitis A. Hepatitis B vaccine  You may need this if you have certain conditions or if you travel or work in places where you may be exposed to hepatitis B. Haemophilus influenzae type b (Hib) vaccine  You may need this if you have certain risk factors. Human papillomavirus (HPV) vaccine  If recommended by your health care provider, you may need three doses over 6 months. You may receive vaccines as individual doses or as more than one vaccine together in one shot (combination vaccines). Talk with your health care provider about the risks and benefits of combination vaccines. What tests do I need? Blood tests  Lipid and cholesterol levels. These may be checked every 5 years, or more frequently if you are over 60 years old.  Hepatitis C test.  Hepatitis B test. Screening  Lung cancer screening. You may have this screening every year starting at age 43 if you have a 30-pack-year history of smoking and currently smoke or have quit within the past 15 years.  Prostate cancer screening. Recommendations will vary depending on your family history and other risks.  Colorectal cancer screening. All adults should have this screening starting at age 72 and continuing until age 2. Your health care provider may recommend screening at age 14 if you are at increased risk. You will have tests every 1-10 years, depending on your results and the type of screening test.  Diabetes screening. This is done by checking your blood sugar (glucose) after you have not eaten  for a while (fasting). You may have this done every 1-3 years.  Sexually transmitted disease (STD) testing. Follow these instructions at home: Eating and drinking  Eat a diet that includes fresh fruits and vegetables, whole grains, lean protein, and low-fat dairy products.  Take vitamin and mineral supplements as  recommended by your health care provider.  Do not drink alcohol if your health care provider tells you not to drink.  If you drink alcohol: ? Limit how much you have to 0-2 drinks a day. ? Be aware of how much alcohol is in your drink. In the [place] one drink equals one 12 oz bottle of beer (355 mL), one 5 oz glass of wine (148 mL), or one 1 oz glass of hard liquor (44 mL). Lifestyle  Take daily care of your teeth and gums.  Stay active. Exercise for at least 30 minutes on 5 or more days each week.  Do not use any products that contain nicotine or tobacco, such as cigarettes, e-cigarettes, and chewing tobacco. If you need help quitting, ask your health care provider.  If you are sexually active, practice safe sex. Use a condom or other form of protection to prevent STIs (sexually transmitted infections).  Talk with your health care provider about taking a low-dose aspirin every day starting at age 73. What's next?  Go to your health care provider once a year for a well check visit.  Ask your health care provider how often you should have your eyes and teeth checked.  Stay up to date on all vaccines. This information is not intended to replace advice given to you by your health care provider. Make sure you discuss any questions you have with your health care provider. Document Released: 11/21/2015 Document Revised: 10/19/2018 Document Reviewed: 10/19/2018 Elsevier Patient Education  2020 [employer].  Managing Your Hypertension Hypertension is commonly called high blood pressure. This is when the force of your blood pressing against the walls of your arteries is too strong. Arteries are blood vessels that carry blood from your heart throughout your body. Hypertension forces the heart to work harder to pump blood, and may cause the arteries to become narrow or stiff. Having untreated or uncontrolled hypertension can cause heart attack, stroke, kidney disease, and other problems. What  are blood pressure readings? A blood pressure reading consists of a higher number over a lower number. Ideally, your blood pressure should be below 120/80. The first ("top") number is called the systolic pressure. It is a measure of the pressure in your arteries as your heart beats. The second ("bottom") number is called the diastolic pressure. It is a measure of the pressure in your arteries as the heart relaxes. What does my blood pressure reading mean? Blood pressure is classified into four stages. Based on your blood pressure reading, your health care provider may use the following stages to determine what type of treatment you need, if any. Systolic pressure and diastolic pressure are measured in a unit called mm Hg. Normal  Systolic pressure: below 291.  Diastolic pressure: below 80. Elevated  Systolic pressure: 916-606.  Diastolic pressure: below 80. Hypertension stage 1  Systolic pressure: 004-599.  Diastolic pressure: 77-41. Hypertension stage 2  Systolic pressure: 423 or above.  Diastolic pressure: 90 or above. What health risks are associated with hypertension? Managing your hypertension is an important responsibility. Uncontrolled hypertension can lead to:  A heart attack.  A stroke.  A weakened blood vessel (aneurysm).  Heart failure.  Kidney damage.  Eye damage.  Metabolic syndrome.  Memory and concentration problems. What changes can I make to manage my hypertension? Hypertension can be managed by making lifestyle changes and possibly by taking medicines. Your health care provider will help you make a plan to bring your blood pressure within a normal range. Eating and drinking   Eat a diet that is high in fiber and potassium, and low in salt (sodium), added sugar, and fat. An example eating plan is called the DASH (Dietary Approaches to Stop Hypertension) diet. To eat this way: ? Eat plenty of fresh fruits and vegetables. Try to fill half of your plate  at each meal with fruits and vegetables. ? Eat whole grains, such as whole wheat pasta, brown rice, or whole grain bread. Fill about one quarter of your plate with whole grains. ? Eat low-fat diary products. ? Avoid fatty cuts of meat, processed or cured meats, and poultry with skin. Fill about one quarter of your plate with lean proteins such as fish, chicken without skin, beans, eggs, and tofu. ? Avoid premade and processed foods. These tend to be higher in sodium, added sugar, and fat.  Reduce your daily sodium intake. Most people with hypertension should eat less than 1,500 mg of sodium a day.  Limit alcohol intake to no more than 1 drink a day for nonpregnant women and 2 drinks a day for men. One drink equals 12 oz of beer, 5 oz of wine, or 1 oz of hard liquor. Lifestyle  Work with your health care provider to maintain a healthy body weight, or to lose weight. Ask what an ideal weight is for you.  Get at least 30 minutes of exercise that causes your heart to beat faster (aerobic exercise) most days of the week. Activities may include walking, swimming, or biking.  Include exercise to strengthen your muscles (resistance exercise), such as weight lifting, as part of your weekly exercise routine. Try to do these types of exercises for 30 minutes at least 3 days a week.  Do not use any products that contain nicotine or tobacco, such as cigarettes and e-cigarettes. If you need help quitting, ask your health care provider.  Control any long-term (chronic) conditions you have, such as high cholesterol or diabetes. Monitoring  Monitor your blood pressure at home as told by your health care provider. Your personal target blood pressure may vary depending on your medical conditions, your age, and other factors.  Have your blood pressure checked regularly, as often as told by your health care provider. Working with your health care provider  Review all the medicines you take with your health  care provider because there may be side effects or interactions.  Talk with your health care provider about your diet, exercise habits, and other lifestyle factors that may be contributing to hypertension.  Visit your health care provider regularly. Your health care provider can help you create and adjust your plan for managing hypertension. Will I need medicine to control my blood pressure? Your health care provider may prescribe medicine if lifestyle changes are not enough to get your blood pressure under control, and if:  Your systolic blood pressure is 130 or higher.  Your diastolic blood pressure is 80 or higher. Take medicines only as told by your health care provider. Follow the directions carefully. Blood pressure medicines must be taken as prescribed. The medicine does not work as well when you skip doses. Skipping doses also puts you at risk for problems. Contact a health  care provider if:  You think you are having a reaction to medicines you have taken.  You have repeated (recurrent) headaches.  You feel dizzy.  You have swelling in your ankles.  You have trouble with your vision. Get help right away if:  You develop a severe headache or confusion.  You have unusual weakness or numbness, or you feel faint.  You have severe pain in your chest or abdomen.  You vomit repeatedly.  You have trouble breathing. Summary  Hypertension is when the force of blood pumping through your arteries is too strong. If this condition is not controlled, it may put you at risk for serious complications.  Your personal target blood pressure may vary depending on your medical conditions, your age, and other factors. For most people, a normal blood pressure is less than 120/80.  Hypertension is managed by lifestyle changes, medicines, or both. Lifestyle changes include weight loss, eating a healthy, low-sodium diet, exercising more, and limiting alcohol. This information is not intended to  replace advice given to you by your health care provider. Make sure you discuss any questions you have with your health care provider. Document Released: 07/19/2012 Document Revised: 02/16/2019 Document Reviewed: 09/22/2016 Elsevier Patient Education  2020 [employer].  Preventing High Cholesterol Cholesterol is a white, waxy substance similar to fat that the human body needs to help build cells. The liver makes all the cholesterol that a person's body needs. Having high cholesterol (hypercholesterolemia) increases a person's risk for heart disease and stroke. Extra (excess) cholesterol comes from the food the person eats. High cholesterol can often be prevented with diet and lifestyle changes. If you already have high cholesterol, you can control it with diet and lifestyle changes and with medicine. How can high cholesterol affect me? If you have high cholesterol, deposits (plaques) may build up on the walls of your arteries. The arteries are the blood vessels that carry blood away from your heart. Plaques make the arteries narrower and stiffer. This can limit or block blood flow and cause blood clots to form. Blood clots:  Are tiny balls of cells that form in your blood.  Can move to the heart or brain, causing a heart attack or stroke. Plaques in arteries greatly increase your risk for heart attack and stroke.Making diet and lifestyle changes can reduce your risk for these conditions that may threaten your life. What can increase my risk? This condition is more likely to develop in people who:  Eat foods that are high in saturated fat or cholesterol. Saturated fat is mostly found in: ? Foods that contain animal fat, such as red meat and some dairy products. ? Certain fatty foods made from plants, such as tropical oils.  Are overweight.  Are not getting enough exercise.  Have a family history of high cholesterol. What actions can I take to prevent this? Nutrition   Eat less  saturated fat.  Avoid trans fats (partially hydrogenated oils). These are often found in margarine and in some baked goods, fried foods, and snacks bought in packages.  Avoid precooked or cured meat, such as sausages or meat loaves.  Avoid foods and drinks that have added sugars.  Eat more fruits, vegetables, and whole grains.  Choose healthy sources of protein, such as fish, poultry, lean cuts of red meat, beans, peas, lentils, and nuts.  Choose healthy sources of fat, such as: ? Nuts. ? Vegetable oils, especially olive oil. ? Fish that have healthy fats (omega-3 fatty acids), such as  mackerel or salmon. The items listed above may not be a complete list of recommended foods and beverages. Contact a dietitian for more information. Lifestyle  Lose weight if you are overweight. Losing 5-10 lb (2.3-4.5 kg) can help prevent or control high cholesterol. It can also lower your risk for diabetes and high blood pressure. Ask your health care provider to help you with a diet and exercise plan to lose weight safely.  Do not use any products that contain nicotine or tobacco, such as cigarettes, e-cigarettes, and chewing tobacco. If you need help quitting, ask your health care provider.  Limit your alcohol intake. ? Do not drink alcohol if:  Your health care provider tells you not to drink.  You are pregnant, may be pregnant, or are planning to become pregnant. ? If you drink alcohol:  Limit how much you use to:  0-1 drink a day for women.  0-2 drinks a day for men.  Be aware of how much alcohol is in your drink. In the [place] one drink equals one 12 oz bottle of beer (355 mL), one 5 oz glass of wine (148 mL), or one 1 oz glass of hard liquor (44 mL). Activity   Get enough exercise. Each week, do at least 150 minutes of exercise that takes a medium level of effort (moderate-intensity exercise). ? This is exercise that:  Makes your heart beat faster and makes you breathe harder than  usual.  Allows you to still be able to talk. ? You could exercise in short sessions several times a day or longer sessions a few times a week. For example, on 5 days each week, you could walk fast or ride your bike 3 times a day for 10 minutes each time.  Do exercises as told by your health care provider. Medicines  In addition to diet and lifestyle changes, your health care provider may recommend medicines to help lower cholesterol. This may be a medicine to lower the amount of cholesterol your liver makes. You may need medicine if: ? Diet and lifestyle changes do not lower your cholesterol enough. ? You have high cholesterol and other risk factors for heart disease or stroke.  Take over-the-counter and prescription medicines only as told by your health care provider. General information  Manage your risk factors for high cholesterol. Talk with your health care provider about all your risk factors and how to lower your risk.  Manage other conditions that you have, such as diabetes or high blood pressure (hypertension).  Have blood tests to check your cholesterol levels at regular points in time as told by your health care provider.  Keep all follow-up visits as told by your health care provider. This is important. Where to find more information  American Heart Association: www.heart.org  National Heart, Lung, and Blood Institute: https://wilson-eaton.com/ Summary  High cholesterol increases your risk for heart disease and stroke. By keeping your cholesterol level low, you can reduce your risk for these conditions.  High cholesterol can often be prevented with diet and lifestyle changes.  Work with your health care provider to manage your risk factors, and have your blood tested regularly. This information is not intended to replace advice given to you by your health care provider. Make sure you discuss any questions you have with your health care provider. Document Released: 11/09/2015  Document Revised: 02/16/2019 Document Reviewed: 07/03/2016 Elsevier Patient Education  2020 [employer].  Ganglion Cyst  A ganglion cyst is a non-cancerous, fluid-filled lump that occurs  near a joint or tendon. The cyst grows out of a joint or the lining of a tendon. Ganglion cysts most often develop in the hand or wrist, but they can also develop in the shoulder, elbow, hip, knee, ankle, or foot. Ganglion cysts are ball-shaped or egg-shaped. Their size can range from the size of a pea to larger than a grape. Increased activity may cause the cyst to get bigger because more fluid starts to build up. What are the causes? The exact cause of this condition is not known, but it may be related to:  Inflammation or irritation around the joint.  An injury.  Repetitive movements or overuse.  Arthritis. What increases the risk? You are more likely to develop this condition if:  You are a woman.  You are 5-42 years old. What are the signs or symptoms? The main symptom of this condition is a lump. It most often appears on the hand or wrist. In many cases, there are no other symptoms, but a cyst can sometimes cause:  Tingling.  Pain.  Numbness.  Muscle weakness.  Weak grip.  Less range of motion in a joint. How is this diagnosed? Ganglion cysts are usually diagnosed based on a physical exam. Your health care provider will feel the lump and may shine a light next to it. If it is a ganglion cyst, the light will likely shine through it. Your health care provider may order an X-ray, ultrasound, or MRI to rule out other conditions. How is this treated? Ganglion cysts often go away on their own without treatment. If you have pain or other symptoms, treatment may be needed. Treatment is also needed if the ganglion cyst limits your movement or if it gets infected. Treatment may include:  Wearing a brace or splint on your wrist or finger.  Taking anti-inflammatory medicine.  Having fluid  drained from the lump with a needle (aspiration).  Getting a steroid injected into the joint.  Having surgery to remove the ganglion cyst.  Placing a pad on your shoe or wearing shoes that will not rub against the cyst if it is on your foot. Follow these instructions at home:  Do not press on the ganglion cyst, poke it with a needle, or hit it.  Take over-the-counter and prescription medicines only as told by your health care provider.  If you have a brace or splint: ? Wear it as told by your health care provider. ? Remove it as told by your health care provider. Ask if you need to remove it when you take a shower or a bath.  Watch your ganglion cyst for any changes.  Keep all follow-up visits as told by your health care provider. This is important. Contact a health care provider if:  Your ganglion cyst becomes larger or more painful.  You have pus coming from the lump.  You have weakness or numbness in the affected area.  You have a fever or chills. Get help right away if:  You have a fever and have any of these in the cyst area: ? Increased redness. ? Red streaks. ? Swelling. Summary  A ganglion cyst is a non-cancerous, fluid-filled lump that occurs near a joint or tendon.  Ganglion cysts most often develop in the hand or wrist, but they can also develop in the shoulder, elbow, hip, knee, ankle, or foot.  Ganglion cysts often go away on their own without treatment. This information is not intended to replace advice given to you by your  health care provider. Make sure you discuss any questions you have with your health care provider. Document Released: 10/22/2000 Document Revised: 10/07/2017 Document Reviewed: 06/24/2017 Elsevier Patient Education  [place].  Elbow Bursitis Bursitis is swelling and pain at the tip of your elbow. This happens when fluid builds up in a sac under your skin (bursa). This may also be called olecranon bursitis. Follow these  instructions at home: Medicines  Take over-the-counter and prescription medicines only as told by your doctor.  If you were prescribed an antibiotic, take it exactly as told by your doctor. Do not stop taking it even if you start to feel better. Managing pain, stiffness, and swelling   If told, put ice on your elbow: ? Put ice in a plastic bag. ? Place a towel between your skin and the bag. ? Leave the ice on for 20 minutes, 2-3 times a day.  If your bursitis is caused by an injury, follow instructions from your doctor about: ? Resting your elbow. ? Wearing a bandage.  Wear elbow pads or elbow wraps as needed. These help cushion your elbow. General instructions  Avoid any activities that cause elbow pain. Ask your doctor what activities are safe for you.  Keep all follow-up visits as told by your doctor. This is important. Contact a doctor if you have:  A fever.  Problems that do not get better with treatment.  Pain or swelling that: ? Gets worse. ? Goes away and then comes back.  Pus draining from your elbow. Get help right away if you have:  Trouble moving your arm, hand, or fingers. Summary  Bursitis is swelling and pain at the tip of the elbow.  You may need to take medicine or put ice on your elbow.  Contact your doctor if your problems do not get better with treatment. This information is not intended to replace advice given to you by your health care provider. Make sure you discuss any questions you have with your health care provider. Document Released: 04/14/2010 Document Revised: 10/07/2017 Document Reviewed: 10/04/2017 Elsevier Patient Education  2020 [place].  Elbow Bursitis Rehab Ask your health care provider which exercises are safe for you. Do exercises exactly as told by your health care provider and adjust them as directed. It is normal to feel mild stretching, pulling, tightness, or discomfort as you do these exercises. Stop right away if you  feel sudden pain or your pain gets worse. Do not begin these exercises until told by your health care provider. Stretching and range-of-motion exercises These exercises warm up your muscles and joints and improve the movement and flexibility of your elbow. The exercises also help to relieve pain and swelling. Elbow flexion, assisted 1. Stand or sit with your left / right arm at your side. 2. Use your other hand to gently push your left / right hand toward your shoulder (assisted) while bending your elbow (flexion). 3. Hold this position for __________ seconds. 4. Slowly return your left / right arm to the starting position. Repeat __________ times. Complete this exercise __________ times a day. Elbow extension, assisted 1. Lie on your back in a comfortable position that allows you to relax your arm muscles. 2. Place a folded towel under your left / right upper arm so that your elbow and shoulder are at the same height. 3. Use your other arm to raise your left / right arm (assisted) until your elbow and hand do not rest on the bed or towel.  Hold your left / right arm out straight with your other hand supporting it. 4. Let the weight of your hand straighten your elbow (extension). You should feel a stretch on the inside of your elbow. ? Keep your arm and chest muscles relaxed. ? If directed, add a small wrist weight or hand weight to increase the stretch. 5. Hold this position for __________ seconds. 6. Slowly release the stretch and return to the starting position. Repeat __________ times. Complete this exercise __________ times a day. Elbow flexion, active 1. Stand or sit with your left / right elbow bent and your palm facing in, toward your body. 2. Bend your elbow as far as you can using only your arm muscles (active flexion). 3. Hold this position for __________ seconds. 4. Slowly return to the starting position. Repeat __________ times. Complete this exercise __________ times a  day. Elbow extension, active 1. Stand or sit with your left / right elbow bent and your palm facing in, toward your body. 2. Slowly straighten your elbow using only your arm muscles (active extension). Stop when you feel a gentle stretch at the front of your arm, or when your arm is straight. 3. Hold this position for __________ seconds. 4. Slowly return to the starting position. Repeat __________ times. Complete this exercise __________ times a day. Strengthening exercises These exercises build strength and endurance in your elbow. Endurance is the ability to use your muscles for a long time, even after they get tired. Elbow flexion, isometric 1. Stand or sit with your left / right arm at waist height. Your palm should face in, toward your body. 2. Place your other hand on top of your left / right forearm. Gently push down while you resist with your left / right arm (isometric flexion). ? Use about 50% effort with both arms. You may be instructed to use more and more effort with your arms each week. ? Try not to let your left / right arm move during the exercise. 3. Hold this position for __________ seconds. 4. Let your muscles relax completely before you repeat the exercise. Repeat __________ times. Complete this exercise __________ times a day. Elbow extension, isometric  1. Stand or sit with your left / right arm at waist height. Your palm should face in, toward your body. 2. Place your other hand on the bottom of your left / right forearm. Gently push up while you resist with your left / right arm (isometric extension). ? Use about 50% effort with both arms. You may be instructed to use more and more effort with your arms each week. ? Try not to let your left / right arm move during the exercise. 3. Hold this position for __________ seconds. 4. Let your muscles relax completely before you repeat the exercise. Repeat __________ times. Complete this exercise __________ times a  day. Biceps curls 1. Sit on a stable chair without armrests, or stand up. 2. Hold a _________ weight in your left / right hand. Your palm should face out, away from your body, at the starting position. 3. Bend your left / right elbow and move your hand up toward your shoulder. Keep your elbow at your side while you bend it. 4. Slowly return to the starting position. Repeat __________ times. Complete this exercise __________ times a day. Triceps curls  1. Lie on your back. 2. Hold a _________ weight in your left / right hand. 3. Bend your left / right elbow to a 90-degree angle (right  angle), so the weight is in front of your face, over your chest, and your elbow is pointed up to the ceiling. 4. Straighten your elbow, raising your hand toward the ceiling. Use your other hand to support your left / right upper arm and to keep it still. 5. Slowly return to the starting position. Repeat __________ times. Complete this exercise __________ times a day. This information is not intended to replace advice given to you by your health care provider. Make sure you discuss any questions you have with your health care provider. Document Released: 10/25/2005 Document Revised: 02/15/2019 Document Reviewed: 11/15/2018 Elsevier Patient Education  2020 [employer].

## 2019-12-11 ENCOUNTER — Other Ambulatory Visit: Payer: Self-pay | Admitting: Family Medicine

## 2019-12-11 DIAGNOSIS — J302 Other seasonal allergic rhinitis: Secondary | ICD-10-CM

## 2019-12-12 ENCOUNTER — Other Ambulatory Visit: Payer: Self-pay | Admitting: Family Medicine

## 2019-12-12 DIAGNOSIS — I10 Essential (primary) hypertension: Secondary | ICD-10-CM

## 2020-04-10 DIAGNOSIS — R05 Cough: Secondary | ICD-10-CM | POA: Diagnosis not present

## 2020-06-27 ENCOUNTER — Telehealth: Payer: Self-pay | Admitting: Family Medicine

## 2020-06-27 DIAGNOSIS — J069 Acute upper respiratory infection, unspecified: Secondary | ICD-10-CM | POA: Diagnosis not present

## 2020-06-27 NOTE — Telephone Encounter (Signed)
Pt had his 1st COVID shot and now is having headaches, diarrhea, shortness of breath, chest pain.  Wants a call back to talk about his syptoms  Please advise

## 2020-06-28 NOTE — Telephone Encounter (Signed)
Called pt state that he was seen at [facility] yesterday where he had chest x-ray, COVID-19  test and also Strep test and they were all normal. Pt advised to call the office if needed, verbalized understanding

## 2020-10-06 ENCOUNTER — Ambulatory Visit (INDEPENDENT_AMBULATORY_CARE_PROVIDER_SITE_OTHER): Payer: BC Managed Care – PPO | Admitting: Cardiology

## 2020-10-06 ENCOUNTER — Encounter: Payer: Self-pay | Admitting: Cardiology

## 2020-10-06 ENCOUNTER — Other Ambulatory Visit: Payer: Self-pay

## 2020-10-06 VITALS — BP 128/90 | HR 74 | Ht 72.0 in | Wt 194.4 lb

## 2020-10-06 DIAGNOSIS — Z87898 Personal history of other specified conditions: Secondary | ICD-10-CM

## 2020-10-06 DIAGNOSIS — Z7189 Other specified counseling: Secondary | ICD-10-CM

## 2020-10-06 DIAGNOSIS — I10 Essential (primary) hypertension: Secondary | ICD-10-CM

## 2020-10-06 DIAGNOSIS — I471 Supraventricular tachycardia: Secondary | ICD-10-CM

## 2020-10-06 DIAGNOSIS — I493 Ventricular premature depolarization: Secondary | ICD-10-CM

## 2020-10-06 NOTE — Progress Notes (Signed)
Cardiology Office Note:    Date:  10/06/2020   ID:  Dustin [name], DOB 1961/03/09, MRN [id number]  PCP:  Billie Ruddy, MD  Cardiologist:  Buford Dresser, MD PhD  Referring MD: Billie Ruddy, MD   CC: follow up  History of Present Illness:    Dustin [name] is a 59 y.o. male with a hx of HTN, strong FH of CAD who is seen for post hospital follow up. I met him in the hospital on 04/19/19 during an [place] for chest pain.  Cardiac history: longstanding chest pain, normal myoview 2017. Ct coronary 04/2019 during admission for chest pain with calcium score of 0, normal cors, no evidence of CAD. PA measured 37 mm. CT angio chest/dissection done the day prior was unremarkable.  Today: Doing well overall. Rare chest pain and palpitations. Notices more when he hasn't slept well or is dehydrated.   Did have about a week of diffuse myalgias, then same symptoms recurred after first Covid shot (about 3 weeks later). Went to urgent care and had workup, started on prednisone. Negative for Covid.   Doing Hello Fresh daily, eating well with this. Working on cutting back sweets. Eats fish at least once a week, eats lean meat otherwise.  Lightheaded/dizzy spells exceptionally rare.  Walking 4-7 miles/day with his job.   Denies shortness of breath at rest or with normal exertion. No PND, orthopnea, LE edema or unexpected weight gain. No syncope.  Not taking atorvastatin after our prior conversation, will discontinue from medication list today.   Past Medical History:  Diagnosis Date  . GERD (gastroesophageal reflux disease)    occasionally with no meds  . Hypertension   . 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 CHOLECYSTECTOMY  2003  . UMBILICAL HERNIA REPAIR  2003  . WISDOM TOOTH EXTRACTION      Current Medications: Current Outpatient Medications on File Prior to Visit  Medication Sig  . acetaminophen (TYLENOL) 500 MG tablet  Take 1,000 mg by mouth every 6 (six) hours as needed for headache (pain).  . fluticasone (FLONASE) 50 MCG/ACT nasal spray PLACE 1 SPRAY INTO BOTH NOSTRILS DAILY.  Marland Kitchen lisinopril-hydrochlorothiazide (ZESTORETIC) 20-12.5 MG tablet TAKE 1/2 TABLET BY MOUTH EVERY DAY  . Multiple Vitamin (MULTIVITAMIN WITH MINERALS) TABS tablet Take 1 tablet by mouth daily.   No current facility-administered medications on file prior to visit.     Allergies:   Novocain [procaine]   Social History   Tobacco Use  . Smoking status: Never Smoker  . Smokeless tobacco: Never Used  Substance Use Topics  . Alcohol use: Yes    Comment: occ beer  . Drug use: No    Family History: The patient's family history includes CAD in his father; Cancer in his sister; Heart disease in his sister; Hypertension in his mother. There is no history of Colon cancer, Colon polyps, Rectal cancer, or Stomach cancer.  ROS:   Please see the history of present illness.  Additional pertinent ROS otherwise unremarkable.  EKGs/Labs/Other Studies Reviewed:    The following studies were reviewed today: Exercise nuclear stress 08/26/2016  CT coronary 04/20/19 -Ca score 0, no evidence of CAD  Monitor 09/2019 3 days of data recorded on Zio monitor. Patient had a min HR of 53 bpm, max HR of 182 bpm, and avg HR of 96 bpm. Predominant underlying rhythm was Sinus Rhythm. No VT, atrial fibrillation, high degree block, or pauses noted. Isolated atrial ectopy  was rare (<1%). PVCs were frequent (6.4%), with rare couplets/triplets and rare bigeminy/trigeminy. There were 35 triggered events, which were sinus rhythm with ectopy. 6 brief Supraventricular Tachycardia runs occurred, the run with the fastest interval lasting 6 beats with a max rate of 182 bpm, the longest lasting 9 beats with an avg rate of 137 bpm.  EKG:  EKG is personally reviewed.  The ekg ordered today demonstrates sinus rhythm with sinus arrhythmia at 74 bpm  Recent Labs: 11/01/2019:  BUN 18; Creatinine, Ser 1.14; Hemoglobin 14.9; Platelets 302.0; Potassium 4.9; Sodium 137  Recent Lipid Panel    Component Value Date/Time   CHOL 193 11/01/2019 0917   TRIG 124.0 11/01/2019 0917   HDL 53.90 11/01/2019 0917   CHOLHDL 4 11/01/2019 0917   VLDL 24.8 11/01/2019 0917   LDLCALC 115 (H) 11/01/2019 0917    Physical Exam:    VS:  BP 128/90 (BP Location: Right Arm, Patient Position: Sitting)   Pulse 74   Ht 6' (1.829 m)   Wt 194 lb 6.4 oz (88.2 kg)   SpO2 97%   BMI 26.37 kg/m     Wt Readings from Last 3 Encounters:  10/06/20 194 lb 6.4 oz (88.2 kg)  11/01/19 188 lb (85.3 kg)  10/03/19 187 lb 12.8 oz (85.2 kg)    GEN: Well nourished, well developed in no acute distress HEENT: Normal, moist mucous membranes NECK: No JVD CARDIAC: regular rhythm, normal S1 and S2, no rubs or gallops. No murmur. VASCULAR: Radial and DP pulses 2+ bilaterally. No carotid bruits RESPIRATORY:  Clear to auscultation without rales, wheezing or rhonchi  ABDOMEN: Soft, non-tender, non-distended MUSCULOSKELETAL:  Ambulates independently SKIN: Warm and dry, no edema NEUROLOGIC:  Alert and oriented x 3. No focal neuro deficits noted. PSYCHIATRIC:  Normal affect   ASSESSMENT:    1. Essential hypertension   2. Paroxysmal SVT (supraventricular tachycardia) ([place])   3. History of chest pain   4. Cardiac risk counseling   5. Counseling on health promotion and disease prevention   6. PVC (premature ventricular contraction)    PLAN:    Palpitations:  -monitor shows show rare/brief paroxysmal SVT and 6% PVC burden -he feels he is managing this well  Hypertension: normotensive today -continue lisinopril-HCTZ (half of 20-12.5 mg tab) -declines retrial of losartan  History of Chest pain:  CT coronary without plaque or calcium -we previously discussed guidelines and recommendations for statin based on calcium score of 0. He initially wished to continue atorvastatin but now does not take  routinely. Ok to discontinue -no further chest pain, likely noncardiac in nature  Cardiac risk counseling and prevention recommendations: -recommend heart healthy/Mediterranean diet, with whole grains, fruits, vegetable, fish, lean meats, nuts, and olive oil. Limit salt. -recommend moderate walking, 3-5 times/week for 30-50 minutes each session. Aim for at least 150 minutes.week. Goal should be pace of 3 miles/hours, or walking 1.5 miles in 30 minutes -recommend avoidance of tobacco products. Avoid excess alcohol.  Plan for follow up: 2 years or sooner PRN.  Buford Dresser, MD, PhD [place]  [facility]   Medication Adjustments/Labs and Tests Ordered: Current medicines are reviewed at length with the patient today.  Concerns regarding medicines are outlined above.  Orders Placed This Encounter  Procedures  . EKG 12-Lead   No orders of the defined types were placed in this encounter.   Patient Instructions  Medication Instructions:  Stop Atorvastatin *If you need a refill on your cardiac medications before your next appointment, please  call your pharmacy*  Lab Work: None ordered this visit If you have labs (blood work) drawn today and your tests are completely normal, you will receive your results only by: Marland Kitchen MyChart Message (if you have MyChart) OR . A paper copy in the mail If you have any lab test that is abnormal or we need to change your treatment, we will call you to review the results.  Testing/Procedures: None ordered this visit  Follow-Up: At [facility], you and your health needs are our priority.  As part of our continuing mission to provide you with exceptional heart care, we have created designated Provider Care Teams.  These Care Teams include your primary Cardiologist (physician) and Advanced Practice Providers (APPs -  Physician Assistants and Nurse Practitioners) who all work together to provide you with the care you need, when you need  it.  Your next appointment:   2 year(s) You will receive a reminder letter in the mail two months in advance. If you don't receive a letter, please call our office to schedule the follow-up appointment.  The format for your next appointment:   In Person  Provider:   Buford Dresser, MD     Signed, Buford Dresser, MD PhD 10/06/2020 6:45 PM    [place]

## 2020-10-06 NOTE — Patient Instructions (Signed)
Medication Instructions:  Stop Atorvastatin *If you need a refill on your cardiac medications before your next appointment, please call your pharmacy*  Lab Work: None ordered this visit If you have labs (blood work) drawn today and your tests are completely normal, you will receive your results only by:  MyChart Message (if you have MyChart) OR  A paper copy in the mail If you have any lab test that is abnormal or we need to change your treatment, we will call you to review the results.  Testing/Procedures: None ordered this visit  Follow-Up: At [facility], you and your health needs are our priority.  As part of our continuing mission to provide you with exceptional heart care, we have created designated Provider Care Teams.  These Care Teams include your primary Cardiologist (physician) and Advanced Practice Providers (APPs -  Physician Assistants and Nurse Practitioners) who all work together to provide you with the care you need, when you need it.  Your next appointment:   2 year(s) You will receive a reminder letter in the mail two months in advance. If you don't receive a letter, please call our office to schedule the follow-up appointment.  The format for your next appointment:   In Person  Provider:   Jodelle Red, MD

## 2020-11-06 ENCOUNTER — Ambulatory Visit (INDEPENDENT_AMBULATORY_CARE_PROVIDER_SITE_OTHER): Payer: PRIVATE HEALTH INSURANCE | Admitting: Family Medicine

## 2020-11-06 ENCOUNTER — Encounter: Payer: Self-pay | Admitting: Family Medicine

## 2020-11-06 ENCOUNTER — Other Ambulatory Visit: Payer: Self-pay

## 2020-11-06 VITALS — BP 138/88 | HR 86 | Temp 98.0°F | Ht 72.0 in | Wt 191.0 lb

## 2020-11-06 DIAGNOSIS — I10 Essential (primary) hypertension: Secondary | ICD-10-CM | POA: Diagnosis not present

## 2020-11-06 DIAGNOSIS — Z0000 Encounter for general adult medical examination without abnormal findings: Secondary | ICD-10-CM | POA: Diagnosis not present

## 2020-11-06 DIAGNOSIS — Z23 Encounter for immunization: Secondary | ICD-10-CM | POA: Diagnosis not present

## 2020-11-06 DIAGNOSIS — Z125 Encounter for screening for malignant neoplasm of prostate: Secondary | ICD-10-CM | POA: Diagnosis not present

## 2020-11-06 DIAGNOSIS — R0789 Other chest pain: Secondary | ICD-10-CM | POA: Diagnosis not present

## 2020-11-06 DIAGNOSIS — E7841 Elevated Lipoprotein(a): Secondary | ICD-10-CM

## 2020-11-06 LAB — CBC WITH DIFFERENTIAL/PLATELET
Basophils Absolute: 0 10*3/uL (ref 0.0–0.1)
Basophils Relative: 0.6 % (ref 0.0–3.0)
Eosinophils Absolute: 0.1 10*3/uL (ref 0.0–0.7)
Eosinophils Relative: 1.3 % (ref 0.0–5.0)
HCT: 44.8 % (ref 39.0–52.0)
Hemoglobin: 15.4 g/dL (ref 13.0–17.0)
Lymphocytes Relative: 24.4 % (ref 12.0–46.0)
Lymphs Abs: 1.6 10*3/uL (ref 0.7–4.0)
MCHC: 34.5 g/dL (ref 30.0–36.0)
MCV: 90.6 fl (ref 78.0–100.0)
Monocytes Absolute: 0.5 10*3/uL (ref 0.1–1.0)
Monocytes Relative: 6.9 % (ref 3.0–12.0)
Neutro Abs: 4.4 10*3/uL (ref 1.4–7.7)
Neutrophils Relative %: 66.8 % (ref 43.0–77.0)
Platelets: 272 10*3/uL (ref 150.0–400.0)
RBC: 4.94 Mil/uL (ref 4.22–5.81)
RDW: 13 % (ref 11.5–15.5)
WBC: 6.6 10*3/uL (ref 4.0–10.5)

## 2020-11-06 LAB — T4, FREE: Free T4: 0.8 ng/dL (ref 0.60–1.60)

## 2020-11-06 LAB — COMPREHENSIVE METABOLIC PANEL
ALT: 21 U/L (ref 0–53)
AST: 22 U/L (ref 0–37)
Albumin: 4.5 g/dL (ref 3.5–5.2)
Alkaline Phosphatase: 42 U/L (ref 39–117)
BUN: 20 mg/dL (ref 6–23)
CO2: 31 mEq/L (ref 19–32)
Calcium: 9.6 mg/dL (ref 8.4–10.5)
Chloride: 103 mEq/L (ref 96–112)
Creatinine, Ser: 1.1 mg/dL (ref 0.40–1.50)
GFR: 72.57 mL/min (ref >60.00)
Glucose, Bld: 102 mg/dL — ABNORMAL HIGH (ref 70–99)
Potassium: 4.6 mEq/L (ref 3.5–5.1)
Sodium: 138 mEq/L (ref 135–145)
Total Bilirubin: 0.6 mg/dL (ref 0.2–1.2)
Total Protein: 7.3 g/dL (ref 6.0–8.3)

## 2020-11-06 LAB — LIPID PANEL
Cholesterol: 204 mg/dL — ABNORMAL HIGH (ref 0–200)
HDL: 56.7 mg/dL (ref >39.00)
LDL Cholesterol: 114 mg/dL — ABNORMAL HIGH (ref 0–99)
NonHDL: 147.05
Total CHOL/HDL Ratio: 4
Triglycerides: 165 mg/dL — ABNORMAL HIGH (ref 0.0–149.0)
VLDL: 33 mg/dL (ref 0.0–40.0)

## 2020-11-06 LAB — TSH: TSH: 1.24 u[IU]/mL (ref 0.35–4.50)

## 2020-11-06 LAB — PSA: PSA: 0.33 ng/mL (ref 0.10–4.00)

## 2020-11-06 LAB — HEMOGLOBIN A1C: Hgb A1c MFr Bld: 5.8 % (ref 4.6–6.5)

## 2020-11-06 MED ORDER — LISINOPRIL-HYDROCHLOROTHIAZIDE 20-12.5 MG PO TABS: 0.5000 | ORAL_TABLET | Freq: Every day | ORAL | 3 refills | Status: DC

## 2020-11-06 NOTE — Patient Instructions (Signed)
Preventive Care 41-61 Years Old, Male Preventive care refers to lifestyle choices and visits with your health care provider that can promote health and wellness. This includes:  A yearly physical exam. This is also called an annual well check.  Regular dental and eye exams.  Immunizations.  Screening for certain conditions.  Healthy lifestyle choices, such as eating a healthy diet, getting regular exercise, not using drugs or products that contain nicotine and tobacco, and limiting alcohol use. What can I expect for my preventive care visit? Physical exam Your health care provider will check:  Height and weight. These may be used to calculate body mass index (BMI), which is a measurement that tells if you are at a healthy weight.  Heart rate and blood pressure.  Your skin for abnormal spots. Counseling Your health care provider may ask you questions about:  Alcohol, tobacco, and drug use.  Emotional well-being.  Home and relationship well-being.  Sexual activity.  Eating habits.  Work and work Statistician. What immunizations do I need?  Influenza (flu) vaccine  This is recommended every year. Tetanus, diphtheria, and pertussis (Tdap) vaccine  You may need a Td booster every 10 years. Varicella (chickenpox) vaccine  You may need this vaccine if you have not already been vaccinated. Zoster (shingles) vaccine  You may need this after age 64. Measles, mumps, and rubella (MMR) vaccine  You may need at least one dose of MMR if you were born in 1957 or later. You may also need a second dose. Pneumococcal conjugate (PCV13) vaccine  You may need this if you have certain conditions and were not previously vaccinated. Pneumococcal polysaccharide (PPSV23) vaccine  You may need one or two doses if you smoke cigarettes or if you have certain conditions. Meningococcal conjugate (MenACWY) vaccine  You may need this if you have certain conditions. Hepatitis A  vaccine  You may need this if you have certain conditions or if you travel or work in places where you may be exposed to hepatitis A. Hepatitis B vaccine  You may need this if you have certain conditions or if you travel or work in places where you may be exposed to hepatitis B. Haemophilus influenzae type b (Hib) vaccine  You may need this if you have certain risk factors. Human papillomavirus (HPV) vaccine  If recommended by your health care provider, you may need three doses over 6 months. You may receive vaccines as individual doses or as more than one vaccine together in one shot (combination vaccines). Talk with your health care provider about the risks and benefits of combination vaccines. What tests do I need? Blood tests  Lipid and cholesterol levels. These may be checked every 5 years, or more frequently if you are over 60 years old.  Hepatitis C test.  Hepatitis B test. Screening  Lung cancer screening. You may have this screening every year starting at age 43 if you have a 30-pack-year history of smoking and currently smoke or have quit within the past 15 years.  Prostate cancer screening. Recommendations will vary depending on your family history and other risks.  Colorectal cancer screening. All adults should have this screening starting at age 72 and continuing until age 2. Your health care provider may recommend screening at age 14 if you are at increased risk. You will have tests every 1-10 years, depending on your results and the type of screening test.  Diabetes screening. This is done by checking your blood sugar (glucose) after you have not eaten  for a while (fasting). You may have this done every 1-3 years.  Sexually transmitted disease (STD) testing. Follow these instructions at home: Eating and drinking  Eat a diet that includes fresh fruits and vegetables, whole grains, lean protein, and low-fat dairy products.  Take vitamin and mineral supplements as  recommended by your health care provider.  Do not drink alcohol if your health care provider tells you not to drink.  If you drink alcohol: ? Limit how much you have to 0-2 drinks a day. ? Be aware of how much alcohol is in your drink. In the [place] one drink equals one 12 oz bottle of beer (355 mL), one 5 oz glass of wine (148 mL), or one 1 oz glass of hard liquor (44 mL). Lifestyle  Take daily care of your teeth and gums.  Stay active. Exercise for at least 30 minutes on 5 or more days each week.  Do not use any products that contain nicotine or tobacco, such as cigarettes, e-cigarettes, and chewing tobacco. If you need help quitting, ask your health care provider.  If you are sexually active, practice safe sex. Use a condom or other form of protection to prevent STIs (sexually transmitted infections).  Talk with your health care provider about taking a low-dose aspirin every day starting at age 53. What's next?  Go to your health care provider once a year for a well check visit.  Ask your health care provider how often you should have your eyes and teeth checked.  Stay up to date on all vaccines. This information is not intended to replace advice given to you by your health care provider. Make sure you discuss any questions you have with your health care provider. Document Revised: 10/19/2018 Document Reviewed: 10/19/2018 Elsevier Patient Education  2020 [employer].

## 2020-11-06 NOTE — Progress Notes (Signed)
Subjective:     Dustin [name] is a 59 y.o. male and is here for a comprehensive physical exam. The patient reports no problems.  Patient states he is doing well overall.  Needs refills on his BP medication.  Patient mentions brief episode of right upper chest discomfort.  Did not last long.  Denies heartburn, SOB, dizziness, headache, changes in vision.  Social History   Socioeconomic History  . Marital status: Married    Spouse name: Not on file  . Number of children: Not on file  . Years of education: Not on file  . Highest education level: Not on file  Occupational History  . Occupation: Theme park manager: [employer]  Tobacco Use  . Smoking status: Never Smoker  . Smokeless tobacco: Never Used  Substance and Sexual Activity  . Alcohol use: Yes    Comment: occ beer  . Drug use: No  . Sexual activity: Yes  Other Topics Concern  . Not on file  Social History Narrative   Lives in [place] with his wife.   Social Determinants of Health   Financial Resource Strain: Not on file  Food Insecurity: Not on file  Transportation Needs: Not on file  Physical Activity: Not on file  Stress: Not on file  Social Connections: Not on file  Intimate Partner Violence: Not on file   Health Maintenance  Topic Date Due  . COVID-19 Vaccine (1) Never done  . INFLUENZA VACCINE  06/08/2020  . COLONOSCOPY (Pts 45-40yrs Insurance coverage will need to be confirmed)  10/29/2027  . TETANUS/TDAP  11/27/2028  . Hepatitis C Screening  Completed  . HIV Screening  Completed    The following portions of the patient's history were reviewed and updated as appropriate: allergies, current medications, past family history, past medical history, past social history, past surgical history and problem list.  Review of Systems Pertinent items noted in HPI and remainder of comprehensive ROS otherwise negative.   Objective:    BP 138/88 (BP Location: Left Arm, Patient  Position: Sitting, Cuff Size: Normal)   Pulse 86   Temp 98 F (36.7 C) (Oral)   Ht 6' (1.829 m)   Wt 191 lb (86.6 kg)   SpO2 98%   BMI 25.90 kg/m  General appearance: alert, cooperative and no distress Head: Normocephalic, without obvious abnormality, atraumatic Eyes: conjunctivae/corneas clear. PERRL, EOM's intact. Fundi benign. Ears: normal TM's and external ear canals both ears Nose: Nares normal. Septum midline. Mucosa normal. No drainage or sinus tenderness. Throat: lips, mucosa, and tongue normal; teeth and gums normal Neck: no adenopathy, no carotid bruit, no JVD, supple, symmetrical, trachea midline and thyroid not enlarged, symmetric, no tenderness/mass/nodules Lungs: clear to auscultation bilaterally Heart: regular rate and rhythm, S1, S2 normal, no murmur, click, rub or gallop Abdomen: soft, non-tender; bowel sounds normal; no masses,  no organomegaly Extremities: No TTP of chest wall. extremities normal, atraumatic, no cyanosis or edema Pulses: 2+ and symmetric Skin: Skin color, texture, turgor normal. No rashes or lesions Lymph nodes: Cervical, supraclavicular, and axillary nodes normal. Neurologic: Alert and oriented X 3, normal strength and tone. Normal symmetric reflexes. Normal coordination and gait    Assessment:    Healthy male exam.      Plan:     Anticipatory guidance given including wearing seatbelts, smoke detectors in the home, increasing physical activity, increasing p.o. intake of water and vegetables. -We will obtain labs -We will obtain PSA -Colonoscopy up-to-date done 10/28/2017.  Due in 2028 -Given handout -Next CPE in 1 year See After Visit Summary for Counseling Recommendations    Essential hypertension -Controlled -Repeat BP 117/74 this visit by this provider -Continue lifestyle modifications -Lisinopril-hydrochlorothiazide 20-12.5 mg daily refilled this visit -Pt encouraged to check BP at home and keep a log to bring with him to clinic -  Plan: CMP, lisinopril-hydrochlorothiazide (ZESTORETIC) 20-12.5 MG tablet  Other chest pain -Discussed possible causes including musculoskeletal pain, GERD, anxiety, hypertension.  Less likely cardiac in nature given description however consider angina - Plan: CBC with Differential/Platelet, TSH, T4, Free, CMP  Elevated lipoprotein(a) -LDL 115 on 11/01/2019 -We will repeat lipid panel next visit -Discussed lifestyle modifications - Plan: Hemoglobin A1c, Lipid panel  Screening for prostate cancer  - Plan: PSA  Need for influenza vaccination -Influenza vaccine given this visit  Follow-up as needed especially for continued chest discomfort or for consistent elevation in BP.  Abbe Amsterdam, MD

## 2020-11-26 ENCOUNTER — Encounter: Payer: Self-pay | Admitting: Family Medicine

## 2021-02-05 IMAGING — CT CT ANGIO CHEST-ABD-PELV FOR DISSECTION W/ AND WO/W CM
2 of 9 series · 14 of 46 positions shown, 16 images · IV contrast (OMNI)
Comparison: CT abdomen 01/29/2015

CLINICAL DATA: Chest pain on [REDACTED]. Short of breath and blurred
vision. Recurrent chest pain and headache. Shoulder blade pain.
Concern for aortic dissection

EXAM:
CT ANGIOGRAPHY CHEST, ABDOMEN AND PELVIS
TECHNIQUE: Multidetector CT imaging through the chest, abdomen and pelvis was
performed using the standard protocol during bolus administration of
intravenous contrast. Multiplanar reconstructed images and MIPs were
obtained and reviewed to evaluate the vascular anatomy.
CONTRAST:  100mL OMNIPAQUE IOHEXOL 350 MG/ML SOLN

[Series 6: dissection 3.0 i30f 3 · axial · 0.82mm/px · z∈[+535,+1156]mm · 11 of 235 slices shown, 13 images]
[im 14/235  soft-tissue]
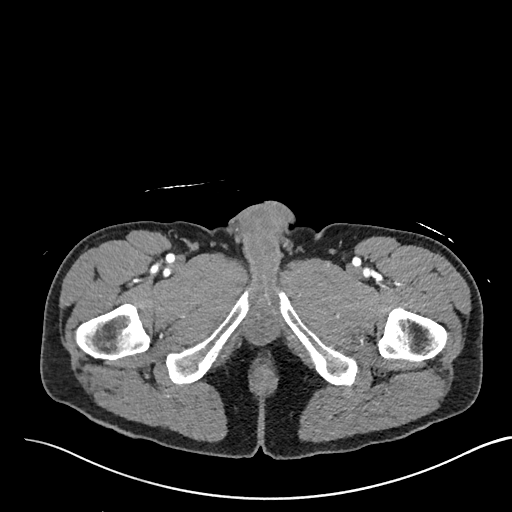
[im 14/235  bone]
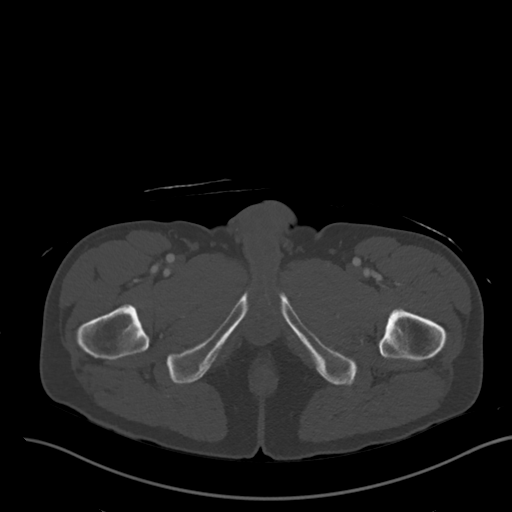
[im 42/235  soft-tissue]
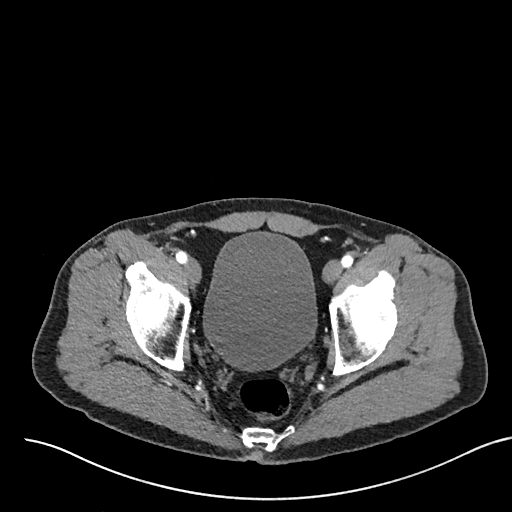
[im 56/235  soft-tissue]
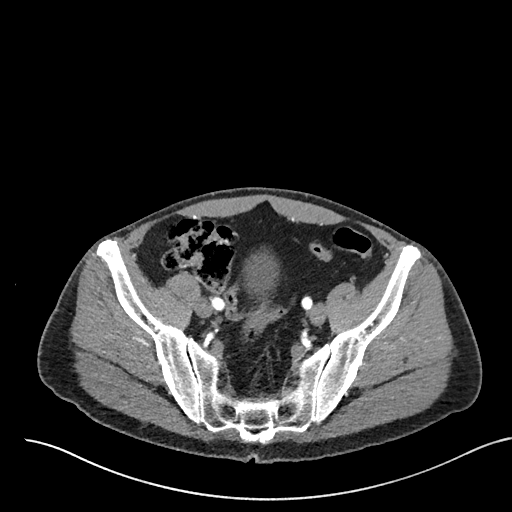
[im 83/235  soft-tissue]
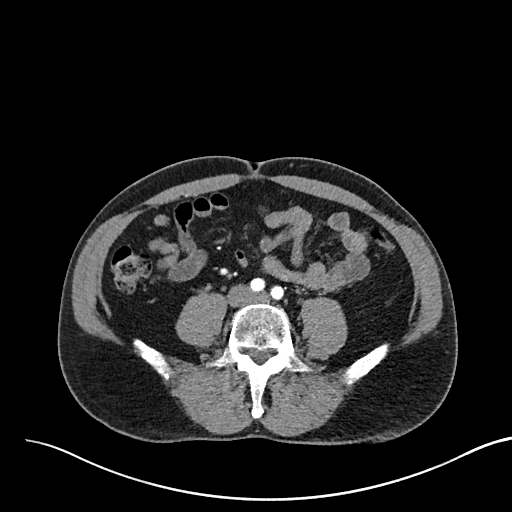
[im 97/235  soft-tissue]
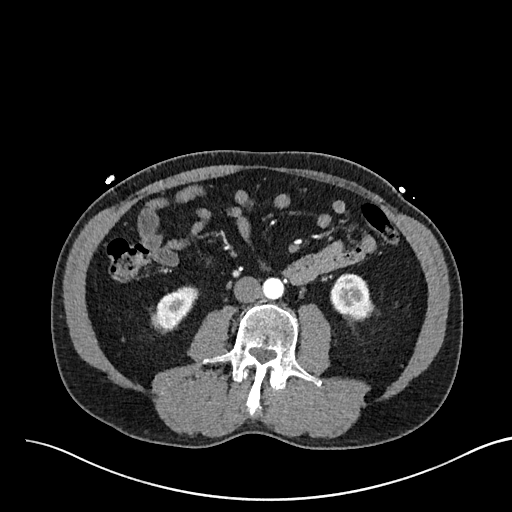
[im 124/235  soft-tissue]
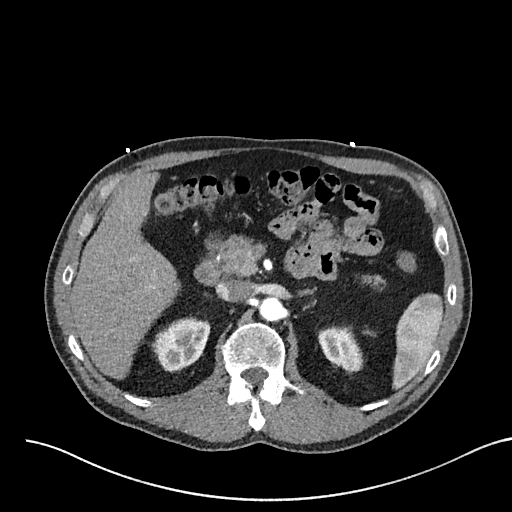
[im 138/235  soft-tissue]
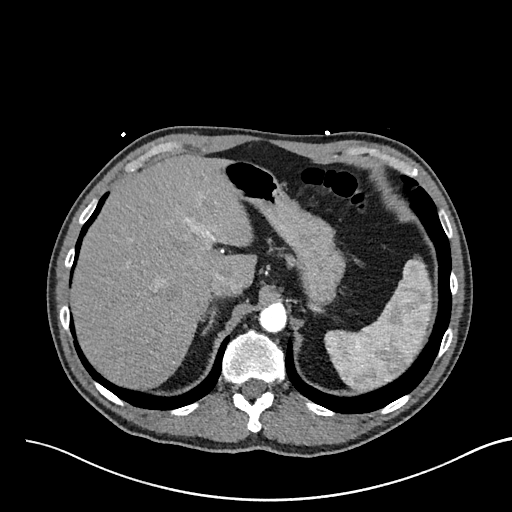
[im 152/235  soft-tissue]
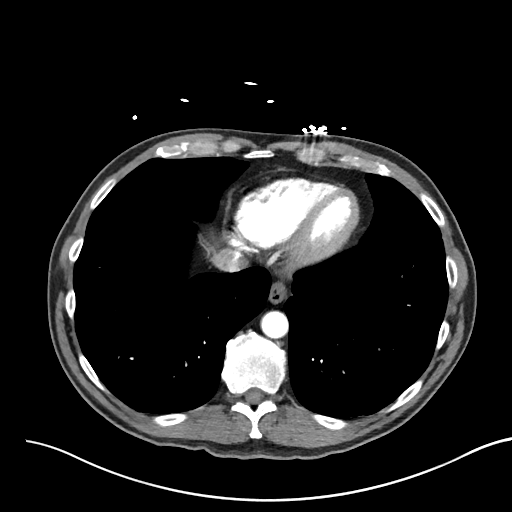
[im 179/235  soft-tissue]
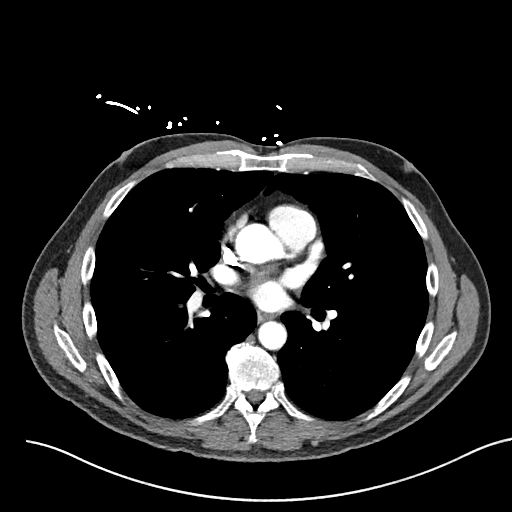
[im 179/235  bone]
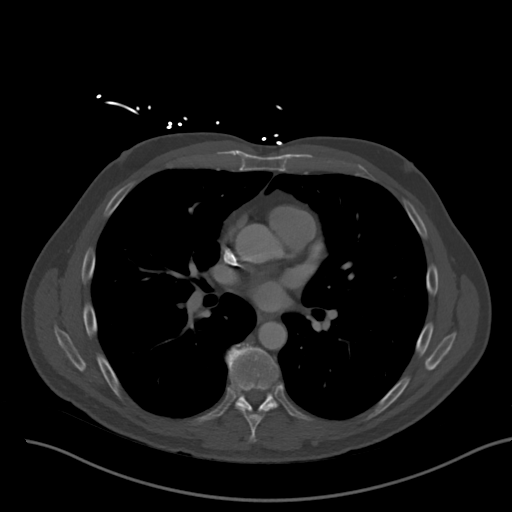
[im 193/235  soft-tissue]
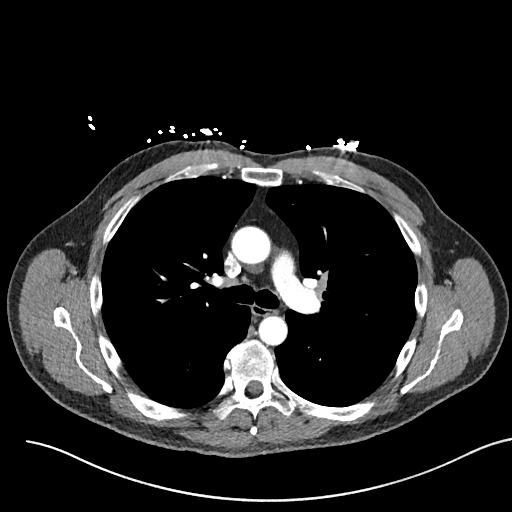
[im 221/235  soft-tissue]
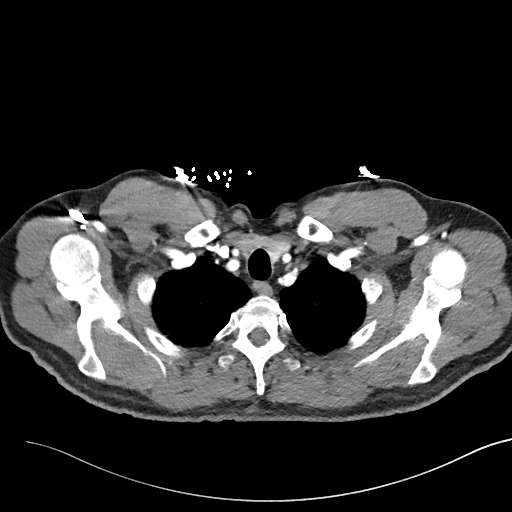

[Series 9: coronals · coronal · 0.78mm/px · 3 of 150 slices shown]
[im 38/150  soft-tissue]
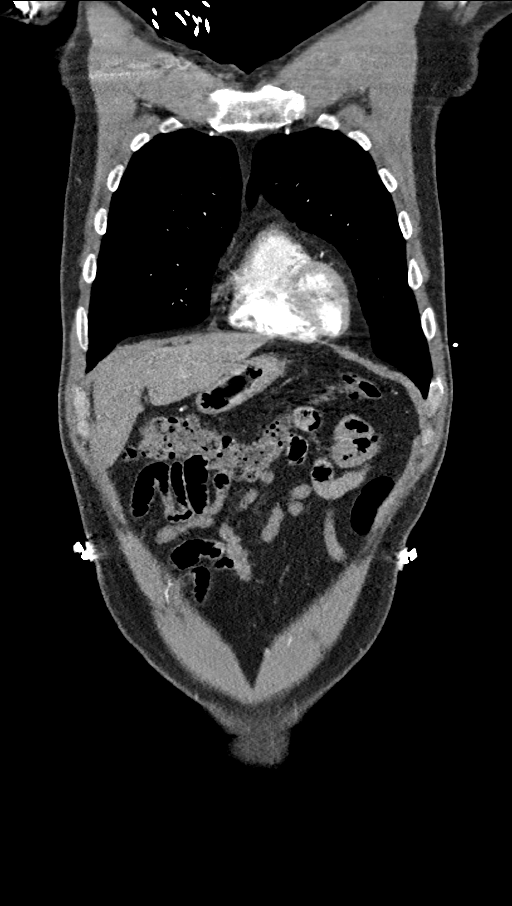
[im 75/150  soft-tissue]
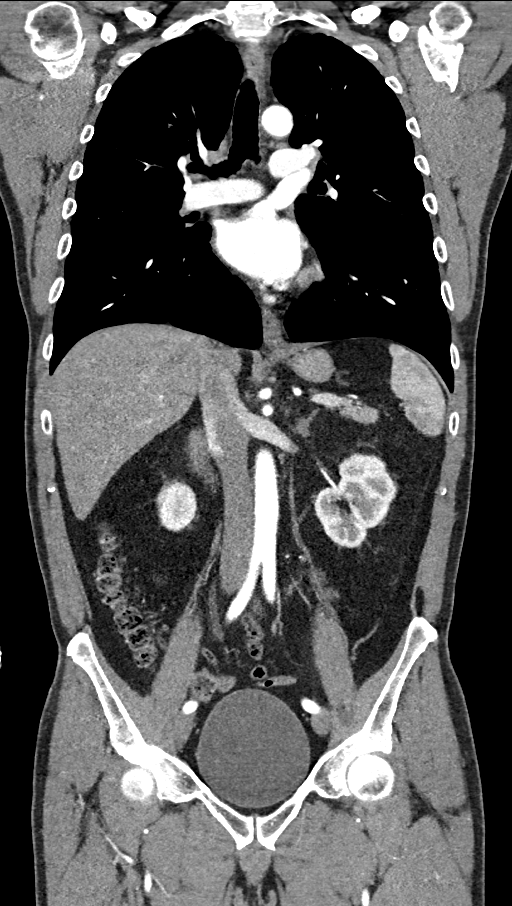
[im 112/150  soft-tissue]
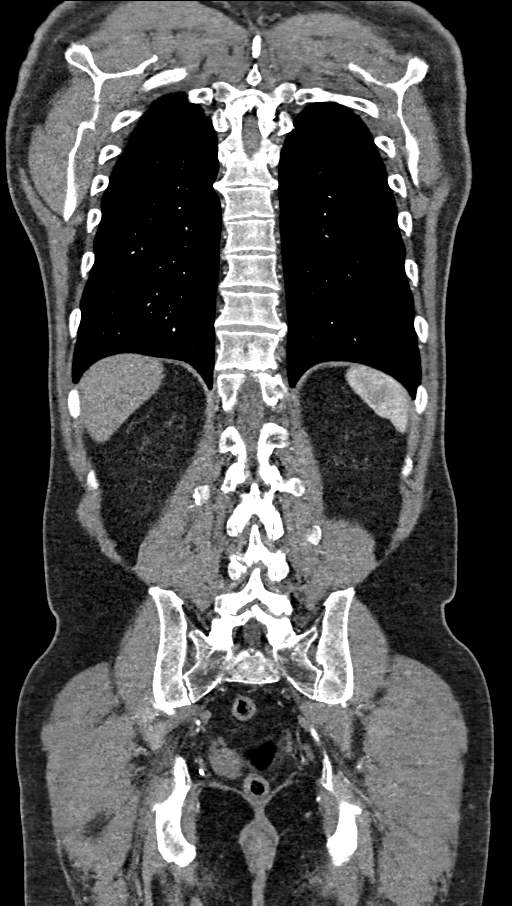

[14 of 46 positions shown; findings below may reference images not displayed]

FINDINGS: CTA CHEST FINDINGS

Cardiovascular: Noncontrast imaging demonstrates no intramural
hematoma within the thoracic aorta. Contrast enhanced imaging
demonstrates no dissection or aneurysm of the thoracic aorta. Great
vessels are normal. No pericardial fluid. Heart appears normal. No
central pulmonary embolism.

Mediastinum/Nodes: No axillary or supraclavicular adenopathy. No
mediastinal hilar adenopathy. Esophagus normal.

Lungs/Pleura: No suspicious pulmonary nodules. No pulmonary
infarction. No infiltrate. No pleural fluid. No pneumothorax.

Musculoskeletal: No acute osseous findings.

Review of the MIP images confirms the above findings.

CTA ABDOMEN AND PELVIS FINDINGS

VASCULAR

Aorta: Abdominal aorta is normal in caliber. No aortic dissection or
aneurysm.

Celiac: Widely patent

SMA: Widely patent

Renals: Single patent renal arteries

IMA: Widely patent

Inflow: Normal

Veins: Poorly opacified but normal

Review of the MIP images confirms the above findings.

NON-VASCULAR

Hepatobiliary: Simple cysts in LEFT hepatic lobe. No biliary duct
dilatation. Postcholecystectomy

Pancreas: .  No pancreatic inflammation or duct dilatation.

Spleen: Normal spleen

Adrenals/Urinary Tract: Adrenal glands, kidneys, ureters and bladder
normal.

Stomach/Bowel: Stomach, small-bowel, appendix cecum normal. The
colon and rectosigmoid colon are normal.

Lymphatic: No retroperitoneal periportal adenopathy. No pelvic
adenopathy.

Reproductive: Prostate normal

Other: No free fluid.  Hernia.

Musculoskeletal: No acute osseous abnormality.

Review of the MIP images confirms the above findings.
IMPRESSION: Chest Impression:

1. Normal thoracic aorta.  No dissection or aneurysm.
2. No acute pulmonary parenchymal findings.

Abdomen / Pelvis Impression:

1. No aortic dissection or aneurysm.
2. No acute findings in the abdomen or pelvis.
3. No acute musculoskeletal abnormality identified.

## 2021-02-05 IMAGING — DX PORTABLE CHEST - 1 VIEW
1 series · 1 of 1 positions shown · non-contrast
Comparison: 01/05/2018.

CLINICAL DATA: BILATERAL chest pain and shortness of breath.

EXAM:
PORTABLE CHEST 1 VIEW

[chest ap]
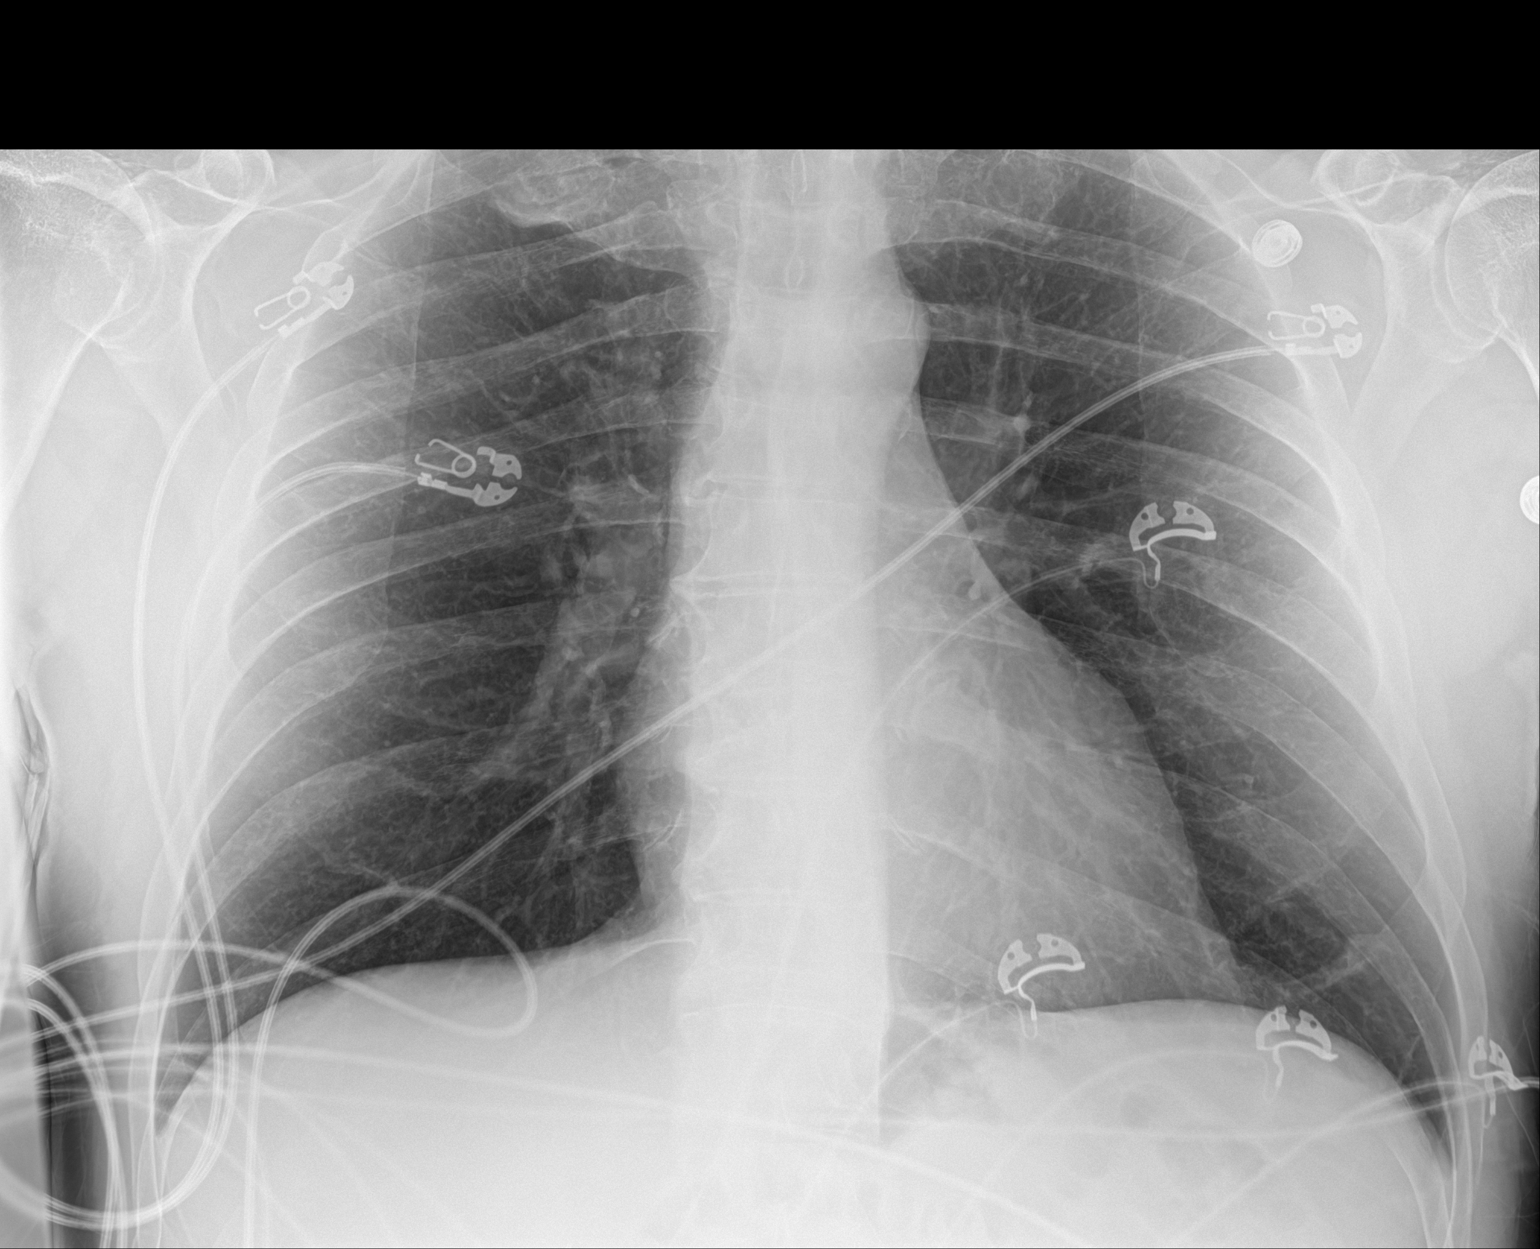

[1 of 1 positions shown; findings below may reference images not displayed]

FINDINGS: The heart size and mediastinal contours are within normal limits.
Both lungs are clear. The visualized skeletal structures are
unremarkable.
IMPRESSION: No active disease.

## 2021-02-10 ENCOUNTER — Telehealth: Payer: Self-pay | Admitting: Cardiology

## 2021-02-10 ENCOUNTER — Emergency Department (HOSPITAL_COMMUNITY)
Admission: EM | Admit: 2021-02-10 | Discharge: 2021-02-10 | Disposition: A | Discharge status: Home / Self Care | Payer: PRIVATE HEALTH INSURANCE | Attending: Emergency Medicine | Admitting: Emergency Medicine

## 2021-02-10 ENCOUNTER — Emergency Department (HOSPITAL_COMMUNITY): Payer: PRIVATE HEALTH INSURANCE

## 2021-02-10 DIAGNOSIS — R079 Chest pain, unspecified: Secondary | ICD-10-CM | POA: Diagnosis present

## 2021-02-10 DIAGNOSIS — R0789 Other chest pain: Secondary | ICD-10-CM | POA: Diagnosis not present

## 2021-02-10 DIAGNOSIS — Z79899 Other long term (current) drug therapy: Secondary | ICD-10-CM | POA: Diagnosis not present

## 2021-02-10 DIAGNOSIS — J0100 Acute maxillary sinusitis, unspecified: Secondary | ICD-10-CM | POA: Diagnosis not present

## 2021-02-10 DIAGNOSIS — I10 Essential (primary) hypertension: Secondary | ICD-10-CM | POA: Insufficient documentation

## 2021-02-10 LAB — TROPONIN I (HIGH SENSITIVITY)
Troponin I (High Sensitivity): 3 ng/L (ref <18)
Troponin I (High Sensitivity): 4 ng/L (ref <18)

## 2021-02-10 LAB — BASIC METABOLIC PANEL
Anion gap: 9 (ref 5–15)
BUN: 17 mg/dL (ref 8–23)
CO2: 24 mmol/L (ref 22–32)
Calcium: 9.6 mg/dL (ref 8.9–10.3)
Chloride: 101 mmol/L (ref 98–111)
Creatinine, Ser: 1.11 mg/dL (ref 0.61–1.24)
GFR, Estimated: >60 mL/min (ref >60)
Glucose, Bld: 118 mg/dL — ABNORMAL HIGH (ref 70–99)
Potassium: 3.9 mmol/L (ref 3.5–5.1)
Sodium: 134 mmol/L — ABNORMAL LOW (ref 135–145)

## 2021-02-10 LAB — CBC
HCT: 45.9 % (ref 39.0–52.0)
Hemoglobin: 15.7 g/dL (ref 13.0–17.0)
MCH: 31.3 pg (ref 26.0–34.0)
MCHC: 34.2 g/dL (ref 30.0–36.0)
MCV: 91.4 fL (ref 80.0–100.0)
Platelets: 292 10*3/uL (ref 150–400)
RBC: 5.02 MIL/uL (ref 4.22–5.81)
RDW: 12.6 % (ref 11.5–15.5)
WBC: 7.8 10*3/uL (ref 4.0–10.5)
nRBC: 0 % (ref 0.0–0.2)

## 2021-02-10 LAB — D-DIMER, QUANTITATIVE: D-Dimer, Quant: 0.49 ug/mL-FEU (ref 0.00–0.50)

## 2021-02-10 MED ORDER — AMOXICILLIN-POT CLAVULANATE 875-125 MG PO TABS: 1.0000 | ORAL_TABLET | Freq: Two times a day (BID) | ORAL | 0 refills | Status: DC

## 2021-02-10 MED ORDER — ACETAMINOPHEN 325 MG PO TABS
650.0000 mg | ORAL_TABLET | Freq: Once | ORAL | Status: AC
  Administered 2021-02-10: 650 mg via ORAL
  Filled 2021-02-10: qty 2

## 2021-02-10 NOTE — Telephone Encounter (Signed)
Patient just left ER today and said they need for him to schedule appt with Dr. Cristal Deer within a week. Informed patient that Dr. Di Kindle next available appt was 03/17/21. Patient said that was unacceptable and wanted to talk with Dr. Di Kindle nurse about appt. Please call patient

## 2021-02-10 NOTE — Telephone Encounter (Signed)
Spoke to pt and scheduled an appointment for 03/09/21 with Dr. Cristal Deer.

## 2021-02-10 NOTE — ED Provider Notes (Signed)
[facility] EMERGENCY DEPARTMENT Provider Note   CSN: [id number] Arrival date & time: 02/10/21  1137     History Chief Complaint  Patient presents with  . Chest Pain    Dustin [name] is a 60 y.o. male with a past medical history of hypertension, GERD presenting to the ED with a chief complaint of chest pain.  States that he has had intermittent chest pressure for the past several years.  He noticed today that he had pinpoint right-sided and left-sided chest pain which is unusual for him to have.  He started experiencing a "hot flash, like someone was pouring boiling water from my head that was trickling down" this morning.  This is unusual for him.  He did notice 2 episodes recently within the past few weeks of him having dyspnea on exertion, with walking up 4 flights of stairs and across the Courtyard as well as helping to move a mattress.  This is not typical for him to have.  Wife does note that he has had a panic attack and sometimes gets more anxious when he has these chest pains.  Denies any leg swelling.  Reports a headache but states that he gets headaches from time to time, this feels like his usual headaches that improve with Tylenol. He took Tylenol and aspirin today.  Patient reports similar symptoms in the past, most recently 2 years ago when he was admitted for chest pain rule out.  States that he had what seems like a CT coronary study which deemed him low risk.  He last saw his cardiologist several months ago and was "cleared for the next 1 to 2 years."  HPI     Past Medical History:  Diagnosis Date  . GERD (gastroesophageal reflux disease)    occasionally with no meds  . Hypertension   . 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 PVC (premature ventricular contraction) 10/03/2019  . Paroxysmal SVT (supraventricular tachycardia) (HCC) 10/03/2019  . History of chest pain 08/27/2019  . Palpitation  08/27/2019  . Elevated blood pressure reading 08/27/2019  . Essential hypertension 01/09/2016  . Renal colic 01/09/2016  . Impaired glucose tolerance 01/09/2016    Past Surgical History:  Procedure Laterality Date  . CHOLECYSTECTOMY  2003  . UMBILICAL HERNIA REPAIR  2003  . WISDOM TOOTH EXTRACTION         Family History  Problem Relation Age of Onset  . Hypertension Mother   . Cancer Sister        melanoma  . CAD Father        CABG at age 28-60  . Heart disease Sister        heart attack 11/2010 - stents placed at age 39-60  . Colon cancer Neg Hx   . Colon polyps Neg Hx   . Rectal cancer Neg Hx   . Stomach cancer Neg Hx     Social History   Tobacco Use  . Smoking status: Never Smoker  . Smokeless tobacco: Never Used  Substance Use Topics  . Alcohol use: Yes    Comment: occ beer  . Drug use: No    Home Medications Prior to Admission medications   Medication Sig Start Date End Date Taking? Authorizing Provider  amoxicillin-clavulanate (AUGMENTIN) 875-125 MG tablet Take 1 tablet by mouth every 12 (twelve) hours. 02/10/21  Yes Jolynda Townley, PA-C  acetaminophen (TYLENOL) 500 MG tablet Take 1,000 mg by mouth  every 6 (six) hours as needed for headache (pain).    [provider]  fluticasone (FLONASE) 50 MCG/ACT nasal spray PLACE 1 SPRAY INTO BOTH NOSTRILS DAILY. 12/11/19   Deeann SaintBanks, Shannon R, MD  lisinopril-hydrochlorothiazide (ZESTORETIC) 20-12.5 MG tablet Take 0.5 tablets by mouth daily. 11/06/20   Deeann SaintBanks, Shannon R, MD  Multiple Vitamin (MULTIVITAMIN WITH MINERALS) TABS tablet Take 1 tablet by mouth daily.    [provider]    Allergies    Novocain [procaine]  Review of Systems   Review of Systems  Constitutional: Negative for appetite change, chills and fever.  HENT: Negative for ear pain, rhinorrhea, sneezing and sore throat.   Eyes: Negative for photophobia and visual disturbance.  Respiratory: Positive for shortness of breath. Negative for cough,  chest tightness and wheezing.   Cardiovascular: Positive for chest pain. Negative for palpitations.  Gastrointestinal: Negative for abdominal pain, blood in stool, constipation, diarrhea, nausea and vomiting.  Genitourinary: Negative for dysuria, hematuria and urgency.  Musculoskeletal: Negative for myalgias.  Skin: Negative for rash.  Neurological: Positive for headaches. Negative for dizziness, weakness and light-headedness.    Physical Exam Updated Vital Signs BP 123/75   Pulse 80   Temp 98 F (36.7 C) (Oral)   Resp 15   Ht 6\' 1"  (1.854 m)   Wt 84.4 kg   SpO2 97%   BMI 24.54 kg/m   Physical Exam Vitals and nursing note reviewed.  Constitutional:      General: He is not in acute distress.    Appearance: He is well-developed.     Comments: Speaking complete sentences without difficulty.  No signs of respiratory distress  HENT:     Head: Normocephalic and atraumatic.     Nose:     Right Sinus: Maxillary sinus tenderness and frontal sinus tenderness present.     Left Sinus: Maxillary sinus tenderness and frontal sinus tenderness present.  Eyes:     General: No scleral icterus.       Left eye: No discharge.     Conjunctiva/sclera: Conjunctivae normal.  Cardiovascular:     Rate and Rhythm: Normal rate and regular rhythm.     Heart sounds: Normal heart sounds. No murmur heard. No friction rub. No gallop.   Pulmonary:     Effort: Pulmonary effort is normal. No respiratory distress.     Breath sounds: Normal breath sounds.  Chest:     Chest wall: Tenderness present.    Abdominal:     General: Bowel sounds are normal. There is no distension.     Palpations: Abdomen is soft.     Tenderness: There is no abdominal tenderness. There is no guarding.  Musculoskeletal:        General: Normal range of motion.     Cervical back: Normal range of motion and neck supple.     Right lower leg: No tenderness. No edema.     Left lower leg: No tenderness. No edema.     Comments: No  lower extremity edema, erythema or calf tenderness bilaterally.  Skin:    General: Skin is warm and dry.     Findings: No rash.  Neurological:     Mental Status: He is alert and oriented to person, place, and time.     Cranial Nerves: No cranial nerve deficit.     Sensory: No sensory deficit.     Motor: No weakness or abnormal muscle tone.     Coordination: Coordination normal.     Comments: Pupils reactive.  No facial asymmetry noted. Cranial nerves appear grossly intact. Sensation intact to light touch on face, BUE and BLE. Strength 5/5 in BUE and BLE.      ED Results / Procedures / Treatments   Labs (all labs ordered are listed, but only abnormal results are displayed) Labs Reviewed  BASIC METABOLIC PANEL - Abnormal; Notable for the following components:      Result Value   Sodium 134 (*)    Glucose, Bld 118 (*)    All other components within normal limits  CBC  D-DIMER, QUANTITATIVE  TROPONIN I (HIGH SENSITIVITY)  TROPONIN I (HIGH SENSITIVITY)    EKG EKG Interpretation  Date/Time:  Tuesday February 10 2021 13:30:31 EDT Ventricular Rate:  73 PR Interval:  146 QRS Duration: 75 QT Interval:  368 QTC Calculation: 406 R Axis:   24 Text Interpretation: Sinus rhythm Abnormal R-wave progression, early transition Confirmed by Alvira Monday ([id number]) on 02/10/2021 3:20:27 PM   Radiology DG Chest 2 View  Result Date: 02/10/2021 CLINICAL DATA:  Chest pain EXAM: CHEST - 2 VIEW COMPARISON:  CT chest dated 04/19/2019 FINDINGS: Lungs are clear.  No pleural effusion or pneumothorax. The heart is normal in size. Visualized osseous structures are within normal limits. IMPRESSION: Normal chest radiographs. Electronically Signed   By: Charline Bills M.D.   On: 02/10/2021 13:10    Procedures Procedures   Medications Ordered in ED Medications  acetaminophen (TYLENOL) tablet 650 mg (650 mg Oral Given 02/10/21 1412)    ED Course  I have reviewed the triage vital signs and the nursing  notes.  Pertinent labs & imaging results that were available during my care of the patient were reviewed by me and considered in my medical decision making (see chart for details).  Clinical Course as of 02/10/21 1530  Tue Feb 10, 2021  1350 Troponin I (High Sensitivity): 3 [HK]  1417 D-Dimer, Quant: 0.49 [HK]  1518 Troponin I (High Sensitivity): 4 [HK]    Clinical Course User Index [HK] Dietrich Pates, PA-C   MDM Rules/Calculators/A&P                          60 year old male with past medical history of hypertension, GERD presenting to the ED with a chief complaint of chest pain.  Reports intermittent chest pressure for the past several years but noticed today that this worsened and has been having pinpoint right-sided and left-sided chest pain which is unusual for him.  Also experienced a "hot flash" this morning.  He has had 2 episodes of dyspnea on exertion within the past few weeks which is not usual for him to half.  Wife does note that he has had a panic attack when he gets more anxious and has these chest pains.  Denies any leg swelling.  Reports headache but is concerned that this could be sinus congestion as has been going on for 2 weeks.  He does have some pressure noted in the maxillary and frontal sinuses on exam.  He has no neurological deficits noted.  His abdomen is soft.  He does have some tenderness of the chest.  No lower extremity edema, erythema or calf tenderness noted concerning for DVT. EKG here shows normal sinus rhythm, no ischemic changes, no STEMI.  Chest x-ray without any acute findings.  Initial troponin is 3, BMP, CBC unremarkable.  Patient reports admission 2 years ago for similar symptoms and had CT coronary study done at that time which showed  a score of 0.  This was thought to be related to noncardiac chest pain.  Will obtain delta troponin as well as D-dimer due to his new onset dyspnea on exertion in addition to his chest pain and inability to use PERC to rule out  PE.  D-dimer is negative.  Delta troponin is 4.  Patient remained hemodynamically stable here. No hypoxia or tachycardia.  Sinus pressure improved with Tylenol.  Will treat with antibiotics as his sinus congestion has been going on for 2 weeks.  As far as his chest pain low suspicion for ACS based on today's work-up as well as his admission 2 years ago.  Offered cardiology consult here but patient declines and states that he will follow-up in the office.  We will have him return for worsening symptoms   Patient is hemodynamically stable, in NAD, and able to ambulate in the ED. Evaluation does not show pathology that would require ongoing emergent intervention or inpatient treatment. I explained the diagnosis to the patient. Pain has been managed and has no complaints prior to discharge. Patient is comfortable with above plan and is stable for discharge at this time. All questions were answered prior to disposition. Strict return precautions for returning to the ED were discussed. Encouraged follow up with PCP.   An After Visit Summary was printed and given to the patient.   Portions of this note were generated with Scientist, clinical (histocompatibility and immunogenetics). Dictation errors may occur despite best attempts at proofreading.  Final Clinical Impression(s) / ED Diagnoses Final diagnoses:  Acute non-recurrent maxillary sinusitis  Chest wall pain    Rx / DC Orders ED Discharge Orders         Ordered    amoxicillin-clavulanate (AUGMENTIN) 875-125 MG tablet  Every 12 hours        02/10/21 1528           Dietrich Pates, PA-C 02/10/21 1530    Horton, Clabe Seal, DO 02/11/21 [phone]

## 2021-02-10 NOTE — Discharge Instructions (Addendum)
Take antibiotics as directed. Follow-up with your cardiologist and primary care provider. Return to the ER if you start to experience worsening chest pain, leg swelling, shortness of breath, increased headache, blurry vision.

## 2021-02-10 NOTE — ED Triage Notes (Signed)
Pt complain of chest pain that started last week. He says his chest pain his something that has been ongoing. Today he noticed a hot flash come over his body that started from his head and traveled down his body which made him feel like he was going to pass out.   Pt. State he has taken 3 baby aspirins so far today.

## 2021-02-11 ENCOUNTER — Encounter: Payer: Self-pay | Admitting: Family Medicine

## 2021-02-11 ENCOUNTER — Telehealth: Payer: PRIVATE HEALTH INSURANCE | Admitting: General Practice

## 2021-02-11 ENCOUNTER — Other Ambulatory Visit: Payer: Self-pay

## 2021-02-11 ENCOUNTER — Ambulatory Visit (INDEPENDENT_AMBULATORY_CARE_PROVIDER_SITE_OTHER): Payer: PRIVATE HEALTH INSURANCE | Admitting: Family Medicine

## 2021-02-11 VITALS — BP 124/88 | HR 96 | Temp 98.0°F | Wt 190.8 lb

## 2021-02-11 DIAGNOSIS — R0602 Shortness of breath: Secondary | ICD-10-CM | POA: Diagnosis not present

## 2021-02-11 DIAGNOSIS — J0100 Acute maxillary sinusitis, unspecified: Secondary | ICD-10-CM

## 2021-02-11 DIAGNOSIS — R251 Tremor, unspecified: Secondary | ICD-10-CM

## 2021-02-11 DIAGNOSIS — R208 Other disturbances of skin sensation: Secondary | ICD-10-CM

## 2021-02-11 DIAGNOSIS — I10 Essential (primary) hypertension: Secondary | ICD-10-CM

## 2021-02-11 DIAGNOSIS — F410 Panic disorder [episodic paroxysmal anxiety] without agoraphobia: Secondary | ICD-10-CM | POA: Diagnosis not present

## 2021-02-11 LAB — VITAMIN D 25 HYDROXY (VIT D DEFICIENCY, FRACTURES): VITD: 40.73 ng/mL (ref 30.00–100.00)

## 2021-02-11 LAB — TSH: TSH: 0.86 u[IU]/mL (ref 0.35–4.50)

## 2021-02-11 LAB — VITAMIN B12: Vitamin B-12: 992 pg/mL — ABNORMAL HIGH (ref 211–911)

## 2021-02-11 LAB — HEMOGLOBIN A1C: Hgb A1c MFr Bld: 5.6 % (ref 4.6–6.5)

## 2021-02-11 LAB — T4, FREE: Free T4: 0.82 ng/dL (ref 0.60–1.60)

## 2021-02-11 NOTE — Telephone Encounter (Signed)
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 My blood pressure is 88/65/143 and right arm is 97/66/133. Feel week in my arms. Chest doesn't hurt too bad just feels a little tight. Thanks   Patient has been outside doing yard work for 30-45 minutes His pulse rate went up before he even started any yard work but he continued on with yard work He reports he has not had a lot to drink today  Encourage hydration as today is warmer and low BP He does not feel lightheaded or dizzy Patient reports previous lowest SBP was 100  HR at closure of call is 95  Was in ED yesterday for sinusitis, chest wall pain  He reports "hot flood in his head" and into his arms yesterday while at work -- patient concerned about stroke Saw PCP today - PCP listened to carotids today and said sounded clear  Patient states he is under stress, may not be staying as hydrated as well as he should He reports constant chest pains in the past but had been doing better here recently He also reports burning in legs  He also reports weakness in his arms -- has not taken his normal vitamin routine as he had been previously   He also reports a panic attack 2 weeks ago. He has been "anxious" for the last 6-8 months d/t job changes  His wife said he may need something for anxiety   Advised will notify Dr. Cristal Deer regarding his BP/pulse and other concerns

## 2021-02-11 NOTE — Progress Notes (Signed)
Subjective:    Patient ID: Dustin [name], male    DOB: 03/15/1961, 60 y.o.   MRN: [id number]  Chief Complaint  Patient presents with  . Follow-up    HPI Patient was seen today for ED f/u.  Pt seen in ED yesterday, 02/10/21 for Right sided CP after lifting a bed as well as panic attack and sinusitis.  EKG with NSR, CXR normal, negative d-dimer, delta trpn 4.  Pt offered Cardiology consult, but declined.  Sent home with Augmentin given sinus congestion x 2 wks and HA that improved with Tylenol.  Pt made appt with Cards for 03/09/21.  Having burning sensation in mid to low back and anterior thighs.  Pt had a flush feeling come over him several wks ago.  Notes increased anxiety/feeling shaky, increased thirst, dry mouth, diarrhea, headache x the last few wks. endorses eating regular meals.  Having [employer] breakfast.  Notes intermittent SOB.  At times able to walk up several flights of stairs without issue.  Past Medical History:  Diagnosis Date  . GERD (gastroesophageal reflux disease)    occasionally with no meds  . Hypertension   . 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 Novocain [Procaine] Swelling    ROS General: Denies fever, chills, night sweats, changes in weight, changes in appetite +increased thirst, flushing sensation HEENT: Denies headaches, ear pain, changes in vision, rhinorrhea, sore throat  +dry mouth, HA CV: Denies palpitations, orthopnea  +SOB, CP Pulm: Denies cough, wheezing  +SOB GI: Denies abdominal pain, nausea, vomiting, constipation  +diarrhea GU: Denies dysuria, hematuria, frequency, vaginal discharge Msk: Denies muscle cramps, joint pains Neuro: Denies weakness, numbness, tingling  +burning sensation in back and thighs Skin: Denies rashes, bruising Psych: Denies depression, anxiety, hallucinations     Objective:    Blood pressure 124/88, pulse 96, temperature 98 F (36.7 C), temperature source Oral, weight 190 lb 12.8 oz (86.5  kg), SpO2 97 %.  Gen. Pleasant, well-nourished, in no distress, normal affect   HEENT: [place]/AT, face symmetric, conjunctiva clear, no scleral icterus, PERRLA, EOMI, nares patent without drainage, pharynx without erythema or exudate. Neck: No JVD, no thyromegaly, no carotid bruits Lungs: no accessory muscle use, CTAB, no wheezes or rales Cardiovascular: RRR, no m/r/g, no peripheral edema Musculoskeletal: No deformities, no cyanosis or clubbing, normal tone Neuro:  A&Ox3, CN II-XII intact, normal gait Skin:  Warm, no lesions/ rash   Wt Readings from Last 3 Encounters:  02/11/21 190 lb 12.8 oz (86.5 kg)  02/10/21 186 lb (84.4 kg)  11/06/20 191 lb (86.6 kg)    Lab Results  Component Value Date   WBC 7.8 02/10/2021   HGB 15.7 02/10/2021   HCT 45.9 02/10/2021   PLT 292 02/10/2021   GLUCOSE 118 (H) 02/10/2021   CHOL 204 (H) 11/06/2020   TRIG 165.0 (H) 11/06/2020   HDL 56.70 11/06/2020   LDLCALC 114 (H) 11/06/2020   ALT 21 11/06/2020   AST 22 11/06/2020   NA 134 (L) 02/10/2021   K 3.9 02/10/2021   CL 101 02/10/2021   CREATININE 1.11 02/10/2021   BUN 17 02/10/2021   CO2 24 02/10/2021   TSH 1.24 11/06/2020   PSA 0.33 11/06/2020   INR 1.0 04/20/2019   HGBA1C 5.8 11/06/2020    Assessment/Plan:  Burning sensation  - Plan: Vitamin B12, Vitamin D, 25-hydroxy, TSH, T4, Free  Panic attack  -PHQ 9 score 2 -GAD 7 score 5 -Discussed the importance of self-care - Plan: TSH,  T4, Free, Hemoglobin A1c  SOB (shortness of breath) -discussed possible casuses including anxiety/panic attacks.  Cardiac etiology less likely given recent labs and EKG -CT coronary study with score of 0 on 04/20/2019. -f/u with cardiology in May.  Acute maxillary sinusitis, recurrence not specified -continue augmentin  Essential hypertension -Controlled -Orthostatics normal -Continue half a tab lisinopril-HCTZ 20-12.5 mg daily -Continue lifestyle modifications  Shaky  -consider hypo or hyperglycemia  given increased thirst, shaky sensation, etc -will obtain labs - Plan: TSH, T4, Free, Hemoglobin A1c  F/u prn  Abbe Amsterdam, MD

## 2021-02-11 NOTE — Patient Instructions (Addendum)
https://www.nimh.nih.gov/health/topics/anxiety-disorders/index.shtml">  Panic Attack A panic attack is a sudden episode of severe anxiety, fear, or discomfort that causes physical and emotional symptoms. The attack may be in response to something frightening, or it may occur for no known reason. Symptoms of a panic attack can be similar to symptoms of a heart attack or stroke. It is important to see your health care provider when you have a panic attack so that these conditions can be ruled out. A panic attack is a symptom of another condition. Most panic attacks go away with treatment of the underlying problem. If you have panic attacks often, you may have a condition called panic disorder. What are the causes? A panic attack may be caused by:  An extreme, life-threatening situation, such as a war or natural disaster.  An anxiety disorder, such as post-traumatic stress disorder.  Depression.  Certain medical conditions, including heart problems, neurological conditions, and infections.  Certain over-the-counter and prescription medicines.  Illegal drugs that increase heart rate and blood pressure, such as methamphetamine.  Alcohol.  Supplements that increase anxiety.  Panic disorder. What increases the risk? You are more likely to develop this condition if:  You have an anxiety disorder.  You have another mental health condition.  You take certain medicines.  You use alcohol, illegal drugs, or other substances.  You are under extreme stress.  A life event is causing increased feelings of anxiety and depression. What are the signs or symptoms? A panic attack starts suddenly, usually lasts about 20 minutes, and occurs with one or more of the following:  A pounding heart.  A feeling that your heart is beating irregularly or faster than normal (palpitations).  Sweating.  Trembling or shaking.  Shortness of breath or feeling smothered.  Feeling choked.  Chest pain or  discomfort.  Nausea or a strange feeling in your stomach.  Dizziness, feeling lightheaded, or feeling like you might faint.  Chills or hot flashes.  Numbness or tingling in your lips, hands, or feet.  Feeling confused, or feeling that you are not yourself.  Fear of losing control or being emotionally unstable.  Fear of dying. How is this diagnosed? A panic attack is diagnosed with an assessment by your health care provider. During the assessment your health care provider will ask questions about:  Your history of anxiety, depression, and panic attacks.  Your medical history.  Whether you drink alcohol, use illegal drugs, take supplements, or take medicines. Be honest about your substance use. Your health care provider may also:  Order blood tests or other kinds of tests to rule out serious medical conditions.  Refer you to a mental health professional for further evaluation.   How is this treated? Treatment depends on the cause of the panic attack:  If the cause is a medical problem, your health care provider will either treat that problem or refer you to a specialist.  If the cause is emotional, you may be given anti-anxiety medicines or referred to a counselor. These medicines may reduce how often attacks happen, reduce how severe the attacks are, and lower anxiety.  If the cause is a medicine, your health care provider may tell you to stop the medicine, change your dose, or take a different medicine.  If the cause is a drug, treatment may involve letting the drug wear off and taking medicine to help the drug leave your body or to counteract its effects. Attacks caused by drug abuse may continue even if you stop using the   drug. Follow these instructions at home:  Take over-the-counter and prescription medicines only as told by your health care provider.  If you feel anxious, limit your caffeine intake.  Take good care of your physical and mental health by: ? Eating a  balanced diet that includes plenty of fresh fruits and vegetables, whole grains, lean meats, and low-fat dairy. ? Getting plenty of rest. Try to get 7-8 hours of uninterrupted sleep each night. ? Exercising regularly. Try to get 30 minutes of physical activity at least 5 days a week. ? Not smoking. Talk to your health care provider if you need help quitting. ? Limiting alcohol intake to no more than 1 drink a day for nonpregnant women and 2 drinks a day for men. One drink equals 12 oz of beer, 5 oz of wine, or 1 oz of hard liquor.  Keep all follow-up visits as told by your health care provider. This is important. Panic attacks may have underlying physical or emotional problems that take time to accurately diagnose. Contact a health care provider if:  Your symptoms do not improve, or they get worse.  You are not able to take your medicine as prescribed because of side effects. Get help right away if:  You have serious thoughts about hurting yourself or others.  You have symptoms of a panic attack. Do not drive yourself to the hospital. Have someone else drive you or call an ambulance. If you ever feel like you may hurt yourself or others, or you have thoughts about taking your own life, get help right away. You can go to your nearest emergency department or call:  Your local emergency services (911 in the [place]  A suicide crisis helpline, such as the National Suicide Prevention Lifeline at 818 736 4842. This is open 24 hours a day. Summary  A panic attack is a sign of a serious health or mental health condition. Get help right away. Do not drive yourself to the hospital. Have someone else drive you or call an ambulance.  Always see a health care provider to have the reasons for the panic attack correctly diagnosed.  If your panic attack was caused by a physical problem, follow your health care provider's suggestions for medicine, referral to a specialist, and lifestyle changes.  If  your panic attack was caused by an emotional problem, follow through with counseling from a qualified mental health specialist.  If you feel like you may hurt yourself or others, call 911 and get help right away. This information is not intended to replace advice given to you by your health care provider. Make sure you discuss any questions you have with your health care provider. Document Revised: 04/24/2020 Document Reviewed: 04/24/2020 Elsevier Patient Education  2021 [employer]  Shortness of Breath, Adult Shortness of breath is when a person has trouble breathing enough air or when a person feels like she or he is having trouble breathing in enough air. Shortness of breath could be a sign of a medical problem. Follow these instructions at home:  Pay attention to any changes in your symptoms.  Do not use any products that contain nicotine or tobacco, such as cigarettes, e-cigarettes, and chewing tobacco.  Do not smoke. Smoking is a common cause of shortness of breath. If you need help quitting, ask your health care provider.  Avoid things that can irritate your airways, such as: ? Mold. ? Dust. ? Air pollution. ? Chemical fumes. ? Things that can cause allergy symptoms (allergens), if you  have allergies.  Keep your living space clean and free of mold and dust.  Rest as needed. Slowly return to your usual activities.  Take over-the-counter and prescription medicines only as told by your health care provider. This includes oxygen therapy and inhaled medicines.  Keep all follow-up visits as told by your health care provider. This is important.   Contact a health care provider if:  Your condition does not improve as soon as expected.  You have a hard time doing your normal activities, even after you rest.  You have new symptoms. Get help right away if:  Your shortness of breath gets worse.  You have shortness of breath when you are resting.  You feel light-headed or you  faint.  You have a cough that is not controlled with medicines.  You cough up blood.  You have pain with breathing.  You have pain in your chest, arms, shoulders, or abdomen.  You have a fever.  You cannot walk up stairs or exercise the way that you normally do. These symptoms may represent a serious problem that is an emergency. Do not wait to see if the symptoms will go away. Get medical help right away. Call your local emergency services (911 in the [place] Do not drive yourself to the hospital. Summary  Shortness of breath is when a person has trouble breathing enough air. It can be a sign of a medical problem.  Avoid things that irritate your lungs, such as smoking, pollution, mold, and dust.  Pay attention to changes in your symptoms and contact your health care provider if you have a hard time completing daily activities because of shortness of breath. This information is not intended to replace advice given to you by your health care provider. Make sure you discuss any questions you have with your health care provider. Document Revised: 03/27/2018 Document Reviewed: 03/27/2018 Elsevier Patient Education  2021 [employer]  Managing Your Hypertension Hypertension, also called high blood pressure, is when the force of the blood pressing against the walls of the arteries is too strong. Arteries are blood vessels that carry blood from your heart throughout your body. Hypertension forces the heart to work harder to pump blood and may cause the arteries to become narrow or stiff. Understanding blood pressure readings Your personal target blood pressure may vary depending on your medical conditions, your age, and other factors. A blood pressure reading includes a higher number over a lower number. Ideally, your blood pressure should be below 120/80. You should know that:  The first, or top, number is called the systolic pressure. It is a measure of the pressure in your arteries as  your heart beats.  The second, or bottom number, is called the diastolic pressure. It is a measure of the pressure in your arteries as the heart relaxes. Blood pressure is classified into four stages. Based on your blood pressure reading, your health care provider may use the following stages to determine what type of treatment you need, if any. Systolic pressure and diastolic pressure are measured in a unit called mmHg. Normal  Systolic pressure: below 120.  Diastolic pressure: below 80. Elevated  Systolic pressure: 120-129.  Diastolic pressure: below 80. Hypertension stage 1  Systolic pressure: 130-139.  Diastolic pressure: 80-89. Hypertension stage 2  Systolic pressure: 140 or above.  Diastolic pressure: 90 or above. How can this condition affect me? Managing your hypertension is an important responsibility. Over time, hypertension can damage the arteries and decrease blood flow  to important parts of the body, including the brain, heart, and kidneys. Having untreated or uncontrolled hypertension can lead to:  A heart attack.  A stroke.  A weakened blood vessel (aneurysm).  Heart failure.  Kidney damage.  Eye damage.  Metabolic syndrome.  Memory and concentration problems.  Vascular dementia. What actions can I take to manage this condition? Hypertension can be managed by making lifestyle changes and possibly by taking medicines. Your health care provider will help you make a plan to bring your blood pressure within a normal range. Nutrition  Eat a diet that is high in fiber and potassium, and low in salt (sodium), added sugar, and fat. An example eating plan is called the Dietary Approaches to Stop Hypertension (DASH) diet. To eat this way: ? Eat plenty of fresh fruits and vegetables. Try to fill one-half of your plate at each meal with fruits and vegetables. ? Eat whole grains, such as whole-wheat pasta, brown rice, or whole-grain bread. Fill about one-fourth of  your plate with whole grains. ? Eat low-fat dairy products. ? Avoid fatty cuts of meat, processed or cured meats, and poultry with skin. Fill about one-fourth of your plate with lean proteins such as fish, chicken without skin, beans, eggs, and tofu. ? Avoid pre-made and processed foods. These tend to be higher in sodium, added sugar, and fat.  Reduce your daily sodium intake. Most people with hypertension should eat less than 1,500 mg of sodium a day.   Lifestyle  Work with your health care provider to maintain a healthy body weight or to lose weight. Ask what an ideal weight is for you.  Get at least 30 minutes of exercise that causes your heart to beat faster (aerobic exercise) most days of the week. Activities may include walking, swimming, or biking.  Include exercise to strengthen your muscles (resistance exercise), such as weight lifting, as part of your weekly exercise routine. Try to do these types of exercises for 30 minutes at least 3 days a week.  Do not use any products that contain nicotine or tobacco, such as cigarettes, e-cigarettes, and chewing tobacco. If you need help quitting, ask your health care provider.  Control any long-term (chronic) conditions you have, such as high cholesterol or diabetes.  Identify your sources of stress and find ways to manage stress. This may include meditation, deep breathing, or making time for fun activities.   Alcohol use  Do not drink alcohol if: ? Your health care provider tells you not to drink. ? You are pregnant, may be pregnant, or are planning to become pregnant.  If you drink alcohol: ? Limit how much you use to:  0-1 drink a day for women.  0-2 drinks a day for men. ? Be aware of how much alcohol is in your drink. In the [place] one drink equals one 12 oz bottle of beer (355 mL), one 5 oz glass of wine (148 mL), or one 1 oz glass of hard liquor (44 mL). Medicines Your health care provider may prescribe medicine if lifestyle  changes are not enough to get your blood pressure under control and if:  Your systolic blood pressure is 130 or higher.  Your diastolic blood pressure is 80 or higher. Take medicines only as told by your health care provider. Follow the directions carefully. Blood pressure medicines must be taken as told by your health care provider. The medicine does not work as well when you skip doses. Skipping doses also puts you at  risk for problems. Monitoring Before you monitor your blood pressure:  Do not smoke, drink caffeinated beverages, or exercise within 30 minutes before taking a measurement.  Use the bathroom and empty your bladder (urinate).  Sit quietly for at least 5 minutes before taking measurements. Monitor your blood pressure at home as told by your health care provider. To do this:  Sit with your back straight and supported.  Place your feet flat on the floor. Do not cross your legs.  Support your arm on a flat surface, such as a table. Make sure your upper arm is at heart level.  Each time you measure, take two or three readings one minute apart and record the results. You may also need to have your blood pressure checked regularly by your health care provider.   General information  Talk with your health care provider about your diet, exercise habits, and other lifestyle factors that may be contributing to hypertension.  Review all the medicines you take with your health care provider because there may be side effects or interactions.  Keep all visits as told by your health care provider. Your health care provider can help you create and adjust your plan for managing your high blood pressure. Where to find more information  National Heart, Lung, and Blood Institute: PopSteam.is  American Heart Association: www.heart.org Contact a health care provider if:  You think you are having a reaction to medicines you have taken.  You have repeated (recurrent)  headaches.  You feel dizzy.  You have swelling in your ankles.  You have trouble with your vision. Get help right away if:  You develop a severe headache or confusion.  You have unusual weakness or numbness, or you feel faint.  You have severe pain in your chest or abdomen.  You vomit repeatedly.  You have trouble breathing. These symptoms may represent a serious problem that is an emergency. Do not wait to see if the symptoms will go away. Get medical help right away. Call your local emergency services (911 in the [place] Do not drive yourself to the hospital. Summary  Hypertension is when the force of blood pumping through your arteries is too strong. If this condition is not controlled, it may put you at risk for serious complications.  Your personal target blood pressure may vary depending on your medical conditions, your age, and other factors. For most people, a normal blood pressure is less than 120/80.  Hypertension is managed by lifestyle changes, medicines, or both.  Lifestyle changes to help manage hypertension include losing weight, eating a healthy, low-sodium diet, exercising more, stopping smoking, and limiting alcohol. This information is not intended to replace advice given to you by your health care provider. Make sure you discuss any questions you have with your health care provider. Document Revised: 11/30/2019 Document Reviewed: 09/25/2019 Elsevier Patient Education  2021 [employer].

## 2021-02-12 NOTE — Telephone Encounter (Signed)
He does have a history of paroxysmal SVT, hard to know if his pulse is sinus tachycardia reacting to low blood pressure/dehydration or SVT. Reassuring that it returned to a good level by the end of the call. If he continues to have blood pressures less than 110 on the top number, he should completely stop the lisinopril-HCTZ. Blood pressure was very good at PCP visit yesterday, which is reassuring.

## 2021-02-16 NOTE — Progress Notes (Signed)
Results viewed on MyChart. 

## 2021-02-19 ENCOUNTER — Encounter: Payer: Self-pay | Admitting: Family Medicine

## 2021-02-22 ENCOUNTER — Other Ambulatory Visit: Payer: Self-pay | Admitting: Family Medicine

## 2021-02-22 DIAGNOSIS — J302 Other seasonal allergic rhinitis: Secondary | ICD-10-CM

## 2021-02-25 ENCOUNTER — Encounter: Payer: Self-pay | Admitting: Family Medicine

## 2021-02-25 ENCOUNTER — Ambulatory Visit (INDEPENDENT_AMBULATORY_CARE_PROVIDER_SITE_OTHER): Payer: PRIVATE HEALTH INSURANCE | Admitting: Family Medicine

## 2021-02-25 ENCOUNTER — Other Ambulatory Visit: Payer: Self-pay

## 2021-02-25 VITALS — BP 142/96 | HR 90 | Temp 97.9°F | Wt 193.6 lb

## 2021-02-25 DIAGNOSIS — R300 Dysuria: Secondary | ICD-10-CM | POA: Diagnosis not present

## 2021-02-25 DIAGNOSIS — N401 Enlarged prostate with lower urinary tract symptoms: Secondary | ICD-10-CM

## 2021-02-25 DIAGNOSIS — R350 Frequency of micturition: Secondary | ICD-10-CM

## 2021-02-25 LAB — POCT URINALYSIS DIPSTICK
Bilirubin, UA: NEGATIVE
Blood, UA: NEGATIVE
Glucose, UA: NEGATIVE
Ketones, UA: NEGATIVE
Leukocytes, UA: NEGATIVE
Nitrite, UA: NEGATIVE
Protein, UA: NEGATIVE
Spec Grav, UA: 1.02 (ref 1.010–1.025)
Urobilinogen, UA: NEGATIVE E.U./dL — AB
pH, UA: 6 (ref 5.0–8.0)

## 2021-02-25 LAB — PSA: PSA: 0.51 ng/mL (ref 0.10–4.00)

## 2021-02-25 LAB — CBC WITH DIFFERENTIAL/PLATELET
Basophils Absolute: 0 10*3/uL (ref 0.0–0.1)
Basophils Relative: 0.6 % (ref 0.0–3.0)
Eosinophils Absolute: 0 10*3/uL (ref 0.0–0.7)
Eosinophils Relative: 0.5 % (ref 0.0–5.0)
HCT: 44.7 % (ref 39.0–52.0)
Hemoglobin: 15.3 g/dL (ref 13.0–17.0)
Lymphocytes Relative: 21.6 % (ref 12.0–46.0)
Lymphs Abs: 1.5 10*3/uL (ref 0.7–4.0)
MCHC: 34.3 g/dL (ref 30.0–36.0)
MCV: 90.1 fl (ref 78.0–100.0)
Monocytes Absolute: 0.5 10*3/uL (ref 0.1–1.0)
Monocytes Relative: 6.9 % (ref 3.0–12.0)
Neutro Abs: 4.9 10*3/uL (ref 1.4–7.7)
Neutrophils Relative %: 70.4 % (ref 43.0–77.0)
Platelets: 301 10*3/uL (ref 150.0–400.0)
RBC: 4.96 Mil/uL (ref 4.22–5.81)
RDW: 13.5 % (ref 11.5–15.5)
WBC: 6.9 10*3/uL (ref 4.0–10.5)

## 2021-02-25 MED ORDER — TAMSULOSIN HCL 0.4 MG PO CAPS: 0.4000 mg | ORAL_CAPSULE | Freq: Every day | ORAL | 3 refills | Status: DC

## 2021-02-25 NOTE — Patient Instructions (Addendum)
Dysuria Dysuria is pain or discomfort during urination. The pain or discomfort may be felt in the part of the body that drains urine from the bladder (urethra) or in the surrounding tissue of the genitals. The pain may also be felt in the groin area, lower abdomen, or lower back. You may have to urinate frequently or have the sudden feeling that you have to urinate (urgency). Dysuria can affect anyone, but it is more common in females. Dysuria can be caused by many different things, including:  Urinary tract infection.  Kidney stones or bladder stones.  Certain STIs (sexually transmitted infections), such as chlamydia.  Dehydration.  Inflammation of the tissues of the vagina.  Use of certain medicines.  Use of certain soaps or scented products that cause irritation. Follow these instructions at home: Medicines  Take over-the-counter and prescription medicines only as told by your health care provider.  If you were prescribed an antibiotic medicine, take it as told by your health care provider. Do not stop taking the antibiotic even if you start to feel better. Eating and drinking  Drink enough fluid to keep your urine pale yellow.  Avoid caffeinated beverages, tea, and alcohol. These beverages can irritate the bladder and make dysuria worse. In males, alcohol may irritate the prostate.   General instructions  Watch your condition for any changes.  Urinate often. Avoid holding urine for long periods of time.  If you are male, you should wipe from front to back after urinating or having a bowel movement. Use each piece of toilet paper only once.  Empty your bladder after sex.  Keep all follow-up visits. This is important.  If you had any tests done to find the cause of dysuria, it is up to you to get your test results. Ask your health care provider, or the department that is doing the test, when your results will be ready. Contact a health care provider if:  You have a  fever.  You develop pain in your back or sides.  You have nausea or vomiting.  You have blood in your urine.  You are not urinating as often as you usually do. Get help right away if:  Your pain is severe and not relieved with medicines.  You cannot eat or drink without vomiting.  You are confused.  You have a rapid heartbeat while resting.  You have shaking or chills.  You feel extremely weak. Summary  Dysuria is pain or discomfort while urinating. Many different conditions can lead to dysuria.  If you have dysuria, you may have to urinate frequently or have the sudden feeling that you have to urinate (urgency).  Watch your condition for any changes. Keep all follow-up visits.  Make sure that you urinate often and drink enough fluid to keep your urine pale yellow. This information is not intended to replace advice given to you by your health care provider. Make sure you discuss any questions you have with your health care provider. Document Revised: 06/06/2020 Document Reviewed: 06/06/2020 Elsevier Patient Education  2021 [employer]  Benign Prostatic Hyperplasia  Benign prostatic hyperplasia (BPH) is an enlarged prostate gland that is caused by the normal aging process and not by cancer. The prostate is a walnut-sized gland that is involved in the production of semen. It is located in front of the rectum and below the bladder. The bladder stores urine and the urethra is the tube that carries the urine out of the body. The prostate may get bigger  as a man gets older. An enlarged prostate can press on the urethra. This can make it harder to pass urine. The build-up of urine in the bladder can cause infection. Back pressure and infection may progress to bladder damage and kidney (renal) failure. What are the causes? This condition is part of a normal aging process. However, not all men develop problems from this condition. If the prostate enlarges away from the urethra,  urine flow will not be blocked. If it enlarges toward the urethra and compresses it, there will be problems passing urine. What increases the risk? This condition is more likely to develop in men over the age of 50 years. What are the signs or symptoms? Symptoms of this condition include:  Getting up often during the night to urinate.  Needing to urinate frequently during the day.  Difficulty starting urine flow.  Decrease in size and strength of your urine stream.  Leaking (dribbling) after urinating.  Inability to pass urine. This needs immediate treatment.  Inability to completely empty your bladder.  Pain when you pass urine. This is more common if there is also an infection.  Urinary tract infection (UTI). How is this diagnosed? This condition is diagnosed based on your medical history, a physical exam, and your symptoms. Tests will also be done, such as:  A post-void bladder scan. This measures any amount of urine that may remain in your bladder after you finish urinating.  A digital rectal exam. In a rectal exam, your health care provider checks your prostate by putting a lubricated, gloved finger into your rectum to feel the back of your prostate gland. This exam detects the size of your gland and any abnormal lumps or growths.  An exam of your urine (urinalysis).  A prostate specific antigen (PSA) screening. This is a blood test used to screen for prostate cancer.  An ultrasound. This test uses sound waves to electronically produce a picture of your prostate gland. Your health care provider may refer you to a specialist in kidney and prostate diseases (urologist). How is this treated? Once symptoms begin, your health care provider will monitor your condition (active surveillance or watchful waiting). Treatment for this condition will depend on the severity of your condition. Treatment may include:  Observation and yearly exams. This may be the only treatment needed if  your condition and symptoms are mild.  Medicines to relieve your symptoms, including: ? Medicines to shrink the prostate. ? Medicines to relax the muscle of the prostate.  Surgery in severe cases. Surgery may include: ? Prostatectomy. In this procedure, the prostate tissue is removed completely through an open incision or with a laparoscope or robotics. ? Transurethral resection of the prostate (TURP). In this procedure, a tool is inserted through the opening at the tip of the penis (urethra). It is used to cut away tissue of the inner core of the prostate. The pieces are removed through the same opening of the penis. This removes the blockage. ? Transurethral incision (TUIP). In this procedure, small cuts are made in the prostate. This lessens the prostate's pressure on the urethra. ? Transurethral microwave thermotherapy (TUMT). This procedure uses microwaves to create heat. The heat destroys and removes a small amount of prostate tissue. ? Transurethral needle ablation (TUNA). This procedure uses radio frequencies to destroy and remove a small amount of prostate tissue. ? Interstitial laser coagulation (ILC). This procedure uses a laser to destroy and remove a small amount of prostate tissue. ? Transurethral electrovaporization (TUVP).  This procedure uses electrodes to destroy and remove a small amount of prostate tissue. ? Prostatic urethral lift. This procedure inserts an implant to push the lobes of the prostate away from the urethra. Follow these instructions at home:  Take over-the-counter and prescription medicines only as told by your health care provider.  Monitor your symptoms for any changes. Contact your health care provider with any changes.  Avoid drinking large amounts of liquid before going to bed or out in public.  Avoid or reduce how much caffeine or alcohol you drink.  Give yourself time when you urinate.  Keep all follow-up visits as told by your health care provider.  This is important. Contact a health care provider if:  You have unexplained back pain.  Your symptoms do not get better with treatment.  You develop side effects from the medicine you are taking.  Your urine becomes very dark or has a bad smell.  Your lower abdomen becomes distended and you have trouble passing your urine. Get help right away if:  You have a fever or chills.  You suddenly cannot urinate.  You feel lightheaded, or very dizzy, or you faint.  There are large amounts of blood or clots in the urine.  Your urinary problems become hard to manage.  You develop moderate to severe low back or flank pain. The flank is the side of your body between the ribs and the hip. These symptoms may represent a serious problem that is an emergency. Do not wait to see if the symptoms will go away. Get medical help right away. Call your local emergency services (911 in the [place] Do not drive yourself to the hospital. Summary  Benign prostatic hyperplasia (BPH) is an enlarged prostate that is caused by the normal aging process and not by cancer.  An enlarged prostate can press on the urethra. This can make it hard to pass urine.  This condition is part of a normal aging process and is more likely to develop in men over the age of 50 years.  Get help right away if you suddenly cannot urinate. This information is not intended to replace advice given to you by your health care provider. Make sure you discuss any questions you have with your health care provider. Document Revised: 07/03/2020 Document Reviewed: 07/03/2020 Elsevier Patient Education  2021 [employer]  Dyslipidemia Dyslipidemia is an imbalance of waxy, fat-like substances (lipids) in the blood. The body needs lipids in small amounts. Dyslipidemia often involves a high level of cholesterol or triglycerides, which are types of lipids. Common forms of dyslipidemia include:  High levels of LDL cholesterol. LDL is the type  of cholesterol that causes fatty deposits (plaques) to build up in the blood vessels that carry blood away from your heart (arteries).  Low levels of HDL cholesterol. HDL cholesterol is the type of cholesterol that protects against heart disease. High levels of HDL remove the LDL buildup from arteries.  High levels of triglycerides. Triglycerides are a fatty substance in the blood that is linked to a buildup of plaques in the arteries. What are the causes? Primary dyslipidemia is caused by changes (mutations) in genes that are passed down through families (inherited). These mutations cause several types of dyslipidemia. Secondary dyslipidemia is caused by lifestyle choices and diseases that lead to dyslipidemia, such as:  Eating a diet that is high in animal fat.  Not getting enough exercise.  Having diabetes, kidney disease, liver disease, or thyroid disease.  Drinking large amounts  of alcohol.  Using certain medicines. What increases the risk? You are more likely to develop this condition if you are an older man or if you are a woman who has gone through menopause. Other risk factors include:  Having a family history of dyslipidemia.  Taking certain medicines, including birth control pills, steroids, some diuretics, and beta-blockers.  Smoking cigarettes.  Eating a high-fat diet.  Having certain medical conditions such as diabetes, polycystic ovary syndrome (PCOS), kidney disease, liver disease, or hypothyroidism.  Not exercising regularly.  Being overweight or obese with too much belly fat. What are the signs or symptoms? In most cases, dyslipidemia does not usually cause any symptoms. In severe cases, very high lipid levels can cause:  Fatty bumps under the skin (xanthomas).  White or gray ring around the black center (pupil) of the eye. Very high triglyceride levels can cause inflammation of the pancreas (pancreatitis). How is this diagnosed? Your health care provider  may diagnose dyslipidemia based on a routine blood test (fasting blood test). Because most people do not have symptoms of the condition, this blood testing (lipid profile) is done on adults age 82 and older and is repeated every 5 years. This test checks:  Total cholesterol. This measures the total amount of cholesterol in your blood, including LDL cholesterol, HDL cholesterol, and triglycerides. A healthy number is below 200.  LDL cholesterol. The target number for LDL cholesterol is different for each person, depending on individual risk factors. Ask your health care provider what your LDL cholesterol should be.  HDL cholesterol. An HDL level of 60 or higher is best because it helps to protect against heart disease. A number below 40 for men or below 50 for women increases the risk for heart disease.  Triglycerides. A healthy triglyceride number is below 150. If your lipid profile is abnormal, your health care provider may do other blood tests.   How is this treated? Treatment depends on the type of dyslipidemia that you have and your other risk factors for heart disease and stroke. Your health care provider will have a target range for your lipid levels based on this information. For many people, this condition may be treated by lifestyle changes, such as diet and exercise. Your health care provider may recommend that you:  Get regular exercise.  Make changes to your diet.  Quit smoking if you smoke. If diet changes and exercise do not help you reach your goals, your health care provider may also prescribe medicine to lower lipids. The most commonly prescribed type of medicine lowers your LDL cholesterol (statin drug). If you have a high triglyceride level, your provider may prescribe another type of drug (fibrate) or an omega-3 fish oil supplement, or both. Follow these instructions at home: Eating and drinking  Follow instructions from your health care provider or dietitian about eating  or drinking restrictions.  Eat a healthy diet as told by your health care provider. This can help you reach and maintain a healthy weight, lower your LDL cholesterol, and raise your HDL cholesterol. This may include: ? Limiting your calories, if you are overweight. ? Eating more fruits, vegetables, whole grains, fish, and lean meats. ? Limiting saturated fat, trans fat, and cholesterol.  If you drink alcohol: ? Limit how much you use. ? Be aware of how much alcohol is in your drink. In the [place] one drink equals one 12 oz bottle of beer (355 mL), one 5 oz glass of wine (148 mL), or one 1  oz glass of hard liquor (44 mL).  Do not drink alcohol if: ? Your health care provider tells you not to drink. ? You are pregnant, may be pregnant, or are planning to become pregnant. Activity  Get regular exercise. Start an exercise and strength training program as told by your health care provider. Ask your health care provider what activities are safe for you. Your health care provider may recommend: ? 30 minutes of aerobic activity 4-6 days a week. Brisk walking is an example of aerobic activity. ? Strength training 2 days a week. General instructions  Do not use any products that contain nicotine or tobacco, such as cigarettes, e-cigarettes, and chewing tobacco. If you need help quitting, ask your health care provider.  Take over-the-counter and prescription medicines only as told by your health care provider. This includes supplements.  Keep all follow-up visits as told by your health care provider.   Contact a health care provider if:  You are: ? Having trouble sticking to your exercise or diet plan. ? Struggling to quit smoking or control your use of alcohol. Summary  Dyslipidemia often involves a high level of cholesterol or triglycerides, which are types of lipids.  Treatment depends on the type of dyslipidemia that you have and your other risk factors for heart disease and  stroke.  For many people, treatment starts with lifestyle changes, such as diet and exercise.  Your health care provider may prescribe medicine to lower lipids. This information is not intended to replace advice given to you by your health care provider. Make sure you discuss any questions you have with your health care provider. Document Revised: 06/19/2018 Document Reviewed: 05/26/2018 Elsevier Patient Education  2021 [employer].

## 2021-02-25 NOTE — Progress Notes (Signed)
Subjective:    Patient ID: Dustin [name], male    DOB: 1961/05/12, 60 y.o.   MRN: [id number]  No chief complaint on file.   HPI Patient was seen today for acute concern.  Pt with urinary frequency, hesitency, pain, dribbling, nocturia x2 weeks.  Patient notes b/l low back pain.  Drinking less fluids 2/2 symptoms.  Notes history of renal calculi in the past but states this feels different.  Denies fever, chills, nausea, vomiting, discharge, constipation.  Past Medical History:  Diagnosis Date  . GERD (gastroesophageal reflux disease)    occasionally with no meds  . Hypertension   . 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 Novocain [Procaine] Swelling    ROS General: Denies fever, chills, night sweats, changes in weight, changes in appetite HEENT: Denies headaches, ear pain, changes in vision, rhinorrhea, sore throat CV: Denies CP, palpitations, SOB, orthopnea Pulm: Denies SOB, cough, wheezing GI: Denies abdominal pain, nausea, vomiting, diarrhea, constipation GU: Denies hematuria  + dysuria, frequency, nocturia, hesitancy Msk: Denies muscle cramps, joint pains Neuro: Denies weakness, numbness, tingling Skin: Denies rashes, bruising Psych: Denies depression, anxiety, hallucinations    Objective:    Blood pressure (!) 142/96, pulse 90, temperature 97.9 F (36.6 C), temperature source Oral, weight 193 lb 9.6 oz (87.8 kg), SpO2 97 %.  Gen. Pleasant, well-nourished, in no distress, normal affect   HEENT: [place]/AT, face symmetric, conjunctiva clear, no scleral icterus, PERRLA, EOMI, nares patent without drainage Lungs: no accessory muscle use, CTAB, no wheezes or rales Cardiovascular: RRR, no m/r/g, no peripheral edema GU: Normal external male genitalia anus, perineum.  No hemorrhoids noted.  Increased rectal tone no stool in rectal vault, prostate enlarged, nonboggy, without TTP.  Hemoccult negative. Chaperone present, T. Thurmond Butts, CMA Musculoskeletal: No  deformities, no cyanosis or clubbing, normal tone Neuro:  A&Ox3, CN II-XII intact, normal gait Skin:  Warm, no lesions/ rash  Wt Readings from Last 3 Encounters:  02/25/21 193 lb 9.6 oz (87.8 kg)  02/11/21 190 lb 12.8 oz (86.5 kg)  02/10/21 186 lb (84.4 kg)    Lab Results  Component Value Date   WBC 7.8 02/10/2021   HGB 15.7 02/10/2021   HCT 45.9 02/10/2021   PLT 292 02/10/2021   GLUCOSE 118 (H) 02/10/2021   CHOL 204 (H) 11/06/2020   TRIG 165.0 (H) 11/06/2020   HDL 56.70 11/06/2020   LDLCALC 114 (H) 11/06/2020   ALT 21 11/06/2020   AST 22 11/06/2020   NA 134 (L) 02/10/2021   K 3.9 02/10/2021   CL 101 02/10/2021   CREATININE 1.11 02/10/2021   BUN 17 02/10/2021   CO2 24 02/10/2021   TSH 0.86 02/11/2021   PSA 0.33 11/06/2020   INR 1.0 04/20/2019   HGBA1C 5.6 02/11/2021    Assessment/Plan:  Dysuria -Discussed possible causes eluding UTI, prostatitis, renal calculi, constipation, hyperglycemia -Hemoglobin A1c 5.6% on 02/11/2021. -We will obtain labs and urine studies - Plan: POCT urinalysis dipstick, Culture, Urine, C. trachomatis/N. gonorrhoeae RNA, CBC with Differential/Platelet, Calcium, ionized, tamsulosin (FLOMAX) 0.4 MG CAPS capsule  Urinary frequency  - Plan: CBC with Differential/Platelet, PSA, tamsulosin (FLOMAX) 0.4 MG CAPS capsule  Benign prostatic hyperplasia with urinary frequency -PSA obtained prior to DRE.  DRE with enlarged prostate -Discussed starting Flomax -Still concern for prostatitis.  Will await labs and start abx such as Bactrim DS twice daily or Cipro 500 mg twice daily times several weeks if needed - Plan: tamsulosin (FLOMAX) 0.4 MG CAPS capsule  F/u as needed  Grier Mitts, MD

## 2021-02-26 LAB — URINE CULTURE
MICRO NUMBER:: 11792413
Result:: NO GROWTH
SPECIMEN QUALITY:: ADEQUATE

## 2021-02-26 LAB — C. TRACHOMATIS/N. GONORRHOEAE RNA
C. trachomatis RNA, TMA: NOT DETECTED
N. gonorrhoeae RNA, TMA: NOT DETECTED

## 2021-02-26 LAB — CALCIUM, IONIZED: Calcium, Ion: 4.89 mg/dL (ref 4.8–5.6)

## 2021-02-27 NOTE — Progress Notes (Signed)
Patient viewed results on MyChart. 

## 2021-03-09 ENCOUNTER — Ambulatory Visit: Payer: PRIVATE HEALTH INSURANCE | Admitting: Cardiology

## 2021-03-18 NOTE — Progress Notes (Signed)
Cardiology Office Note:    Date:  03/19/2021   ID:  Dustin [name], DOB [birth year], MRN [id number]  PCP:  [name], MD  Cardiologist:  Jodelle Red, MD  Electrophysiologist:  None   Referring MD: [name], MD   Chief Complaint: follow-up of ED visit for chest pain  History of Present Illness:    Dustin [name] is a 60 y.o. male with a history of normal coronaries on coroanry CTA in 04/2019, palpitations with frequent PVCs and brief runs of SVT noted on monitor in 09/2019, hypertension, and strong family history of CAD who is followed by Dr. Cristal Deer and presents today for follow-up of ED visit for chest pain.  Patient was first seen by Dr. Cristal Deer in 04/2019 during a hospitalization for chest pain. Coronary CTA at that time showed coronary calcium score of 0 and no evidence of CAD. Patient has continued to have intermittent chest pain since that time but felt to likely be non-cardiac. At visit in 08/2019, patient reported more palpitations. Zio monitor was ordered and showed underlying sinus rhythm with frequent PVCs (6.4) as well as brief runs of SVT; however, no VT, atrial fibrillation, or high grade AV block/pauses. Patient did not wish to start any AV nodal agents at that time. Patient was last seen by Dr. Cristal Deer in 09/2020 at which time he was doing well overall with rate chest pains and palpitations.   Since last visit, patient was seen in the ED on 02/10/2021 for evaluation of chest pain. He described pin-point right sided and left sided chest pain as well as "hot flash." There was mention of some anxiety and recent panic attack so this may be playing a role as well. EKG showed no acute ischemic changes. High-sensitivity troponin negative x2. D-dimer negative. He was prescribed antibiotics for possible sinus infection but was felt to be stable for discharge.  Patient sent a MyChart message on 02/11/2021 about hypotension and tachycardia when outside working in  the yard. BP dropped as low as 88/65 and heart rate was in the 140's. However, patient was not symptomatic with this. He was advised to stay hydrated and his BP improved.  Patient presents today for follow-up. We discussed events that led to ED visit. He states he was at work when he all of a sudden felt like someone poured hot water down his arms. He also reported some pinpoint pain in the center of his chest. He states this was different than the his usual chronic chest pain which is diffuse all across his chest. He still is having his chronic chest pain but states this is unchanged. He also reports a pinpoint chest pain on the right side that shoots to his back as well as some numbness down his arms. Pain is worse when he takes a deep breath. D-dimer at recent ED visit was negative. It really sounds musculoskeletal in nature. Patient states he has injured his c-spine before and also has bad shoulders. No significant shortness of breath, orthopnea, PND, or edema. He has no recurrent "hot water" sensation since discharge. He does report he has been feeling lightheaded almost everyday for the last 2 weeks and has had 2 near syncopal episodes. No overt syncope. No palpitations at this time. He thinks he may have been dehydrated and BP may have been low at those times.   Past Medical History:  Diagnosis Date  . GERD (gastroesophageal reflux disease)    occasionally with no meds  . Hypertension   .  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 CHOLECYSTECTOMY  2003  . UMBILICAL HERNIA REPAIR  2003  . WISDOM TOOTH EXTRACTION      Current Medications: Current Meds  Medication Sig  . acetaminophen (TYLENOL) 500 MG tablet Take 1,000 mg by mouth every 6 (six) hours as needed for headache (pain).  . fluticasone (FLONASE) 50 MCG/ACT nasal spray PLACE 1 SPRAY INTO BOTH NOSTRILS DAILY.  . metoprolol succinate (TOPROL XL) 25 MG 24 hr tablet Take 1 tablet (25 mg  total) by mouth daily.  . Multiple Vitamin (MULTIVITAMIN WITH MINERALS) TABS tablet Take 1 tablet by mouth daily.  . tamsulosin (FLOMAX) 0.4 MG CAPS capsule Take 1 capsule (0.4 mg total) by mouth daily.  . [DISCONTINUED] lisinopril-hydrochlorothiazide (ZESTORETIC) 20-12.5 MG tablet Take 0.5 tablets by mouth daily.     Allergies:   Novocain [procaine]   Social History   Socioeconomic History  . Marital status: Married    Spouse name: Not on file  . Number of children: Not on file  . Years of education: Not on file  . Highest education level: Not on file  Occupational History  . Occupation: Theme park manager: [employer]  Tobacco Use  . Smoking status: Never Smoker  . Smokeless tobacco: Never Used  Substance and Sexual Activity  . Alcohol use: Yes    Comment: occ beer  . Drug use: No  . Sexual activity: Yes  Other Topics Concern  . Not on file  Social History Narrative   Lives in [place] with his wife.   Social Determinants of Health   Financial Resource Strain: Not on file  Food Insecurity: Not on file  Transportation Needs: Not on file  Physical Activity: Not on file  Stress: Not on file  Social Connections: Not on file     Family History: The patient's family history includes CAD in his father; Cancer in his sister; Heart disease in his sister; Hypertension in his mother. There is no history of Colon cancer, Colon polyps, Rectal cancer, or Stomach cancer.  ROS:   Please see the history of present illness.     EKGs/Labs/Other Studies Reviewed:    The following studies were reviewed today:  Myoview 08/26/2016:  Nuclear stress EF: 61%. Normal LV function  There was no ST segment deviation noted during stress.  The study is normal. No ischemia . No infarction  This is a low risk study.  _______________  Coronary CTA 04/20/2019: Impressions: 1. Coronary calcium score of 0. This was 0 percentile for age and sex matched  control. 2. Normal coronary origin with right dominance. 3. No evidence of CAD.  Consider non-cardiac causes of chest pain. 4. Moderately dilated pulmonary artery measuring 37 mm suggestive of pulmonary hypertension. _______________  [employer] Monitor 09/08/2019 to 09/11/2019: days of data recorded on Zio monitor. Patient had a min HR of 53 bpm, max HR of 182 bpm, and avg HR of 96 bpm. Predominant underlying rhythm was Sinus Rhythm. No VT, atrial fibrillation, high degree block, or pauses noted. Isolated atrial ectopy was rare (<1%). PVCs were frequent (6.4%), with rare couplets/triplets and rare bigeminy/trigeminy. There were 35 triggered events, which were sinus rhythm with ectopy. 6 brief Supraventricular Tachycardia runs occurred, the run with the fastest interval lasting 6 beats with a max rate of 182 bpm, the longest lasting 9 beats with an avg rate of 137 bpm.   EKG:  EKG ordered today. EKG personally  reviewed and demonstrates sinus tachycardia, 104 bpm, with PACs and nonspecific ST/T changes. Normal axis. Normal PR and QRS intervals. QTc 436 ms.  Recent Labs: 11/06/2020: ALT 21 02/10/2021: BUN 17; Creatinine, Ser 1.11; Potassium 3.9; Sodium 134 02/11/2021: TSH 0.86 02/25/2021: Hemoglobin 15.3; Platelets 301.0  Recent Lipid Panel    Component Value Date/Time   CHOL 204 (H) 11/06/2020 0901   TRIG 165.0 (H) 11/06/2020 0901   HDL 56.70 11/06/2020 0901   CHOLHDL 4 11/06/2020 0901   VLDL 33.0 11/06/2020 0901   LDLCALC 114 (H) 11/06/2020 0901    Physical Exam:    Vital Signs: BP 120/74   Pulse (!) 104   Ht 6\' 1"  (1.854 m)   Wt 195 lb 12.8 oz (88.8 kg)   BMI 25.83 kg/m     Wt Readings from Last 3 Encounters:  03/19/21 195 lb 12.8 oz (88.8 kg)  02/25/21 193 lb 9.6 oz (87.8 kg)  02/11/21 190 lb 12.8 oz (86.5 kg)     General: 60 y.o. male in no acute distress. HEENT: Normocephalic and atraumatic. Sclera clear.  Neck: Supple. No carotid bruits. No JVD. Heart: RRR. Distinct S1 and S2.  No murmurs, gallops, or rubs. Radial pulses 2+ and equal bilaterally. Lungs: No increased work of breathing. Clear to ausculation bilaterally. No wheezes, rhonchi, or rales.  Abdomen: Soft, non-distended, and non-tender to palpation.  Extremities: No lower extremity edema.    Skin: Warm and dry. Neuro: Alert and oriented x3. No focal deficits. Psych: Normal affect. Responds appropriately.   Assessment:    1. Chronic chest pain   2. Lightheadedness   3. Near syncope   4. Palpitations   5. Primary hypertension     Plan:    Chronic Chest Pain Strong Family History of CAD - History of intermittent chest pain. Coronary CTA in 04/2019 showed coronary calcium score of 0 with no evidence of CAD. Recently seen in the ED last month for chest pain. Troponin and EKG were unremarkable.  - EKG today shows no acute ischemic changes.  - Continues to have chronic chest pain which is unchanged. Also reports a right sided chest pain that sounds musculoskeletal in nature. Does not sound like cardiac chest pain. Discussed that he may benefit from seeing Ortho. - No additional ischemic work-up necessary at this time.  Lightheadedness Near Syncope - Patient reports lightheadedness every day for the past 2 weeks. HE also reports 2 episodes of near syncope. He thinks he may have been dehydrated/hypotensive at these times.  - Will check Echo. - Adjusting antihypertensive as below. - Patient mentioned one of his friend's thought he should have his carotid arteries evaluated No bruits noted on exam. Did offer carotid ultrasounds but patient would like to get Echo first and then go from there given no bruits on exam.   Palpitations - Monitor in 09/2019 showed frequent PVC (6% burden) and short runs of paroxysmal SVT. - Continue to note occasional palpitations.  - EKG today shows sinus tachycardia, rate 104 bpm, with PACs.  - Will add Toprol-XL 25mg  daily.  Hypertension - BP well controlled in the office  but he has recently had some low BP readings at home. - Will stop Lisinopril-HCTZ. - Will start Toprol-XL 25mg  for more heart rate control. - Patient to keep BP/HR log for 2 weeks and then send this to 10/2019.  Disposition: Follow up in 4-6 months with Dr.   Medication Adjustments/Labs and Tests Ordered: Current medicines are reviewed at length with the  patient today.  Concerns regarding medicines are outlined above.  Orders Placed This Encounter  Procedures  . EKG 12-Lead  . ECHOCARDIOGRAM COMPLETE   Meds ordered this encounter  Medications  . metoprolol succinate (TOPROL XL) 25 MG 24 hr tablet    Sig: Take 1 tablet (25 mg total) by mouth daily.    Dispense:  90 tablet    Refill:  1    Patient Instructions  Medication Instructions:  Stop Lisinopril-Hydrochlorothiazide  Start Metoprolol Succinate 25 mg daily   *If you need a refill on your cardiac medications before your next appointment, please call your pharmacy*    Testing/Procedures: Echocardiogram - Your physician has requested that you have an echocardiogram. Echocardiography is a painless test that uses sound waves to create images of your heart. It provides your doctor with information about the size and shape of your heart and how well your heart's chambers and valves are working. This procedure takes approximately one hour. There are no restrictions for this procedure. [facility] be performed [facility] - [address], [address].    Follow-Up: At [facility], you and your health needs are our priority.  As part of our continuing mission to provide you with exceptional heart care, we have created designated Provider Care Teams.  These Care Teams include your primary Cardiologist (physician) and Advanced Practice Providers (APPs -  Physician Assistants and Nurse Practitioners) who all work together to provide you with the care you need, when you need it.  We recommend signing up for the  patient portal called "MyChart".  Sign up information is provided on this After Visit Summary.  MyChart is used to connect with patients for Virtual Visits (Telemedicine).  Patients are able to view lab/test results, encounter notes, upcoming appointments, etc.  Non-urgent messages can be sent to your provider as well.   To learn more about what you can do with MyChart, go to [email or web address].    Your next appointment:   4 month(s)  The format for your next appointment:   In Person  Provider:   Jodelle RedBridgette Christopher, MD   Other Instructions Keep a BP/HR log for 2 weeks- and let us know [phone]      Signed, Corrin ParkerCallie E Yeng Perz, PA-C  03/19/2021 8:58 PM    [facility]

## 2021-03-19 ENCOUNTER — Encounter: Payer: Self-pay | Admitting: Student

## 2021-03-19 ENCOUNTER — Ambulatory Visit (INDEPENDENT_AMBULATORY_CARE_PROVIDER_SITE_OTHER): Payer: PRIVATE HEALTH INSURANCE | Admitting: Student

## 2021-03-19 ENCOUNTER — Other Ambulatory Visit: Payer: Self-pay

## 2021-03-19 VITALS — BP 120/74 | HR 104 | Ht 73.0 in | Wt 195.8 lb

## 2021-03-19 DIAGNOSIS — R55 Syncope and collapse: Secondary | ICD-10-CM | POA: Diagnosis not present

## 2021-03-19 DIAGNOSIS — R42 Dizziness and giddiness: Secondary | ICD-10-CM | POA: Diagnosis not present

## 2021-03-19 DIAGNOSIS — I10 Essential (primary) hypertension: Secondary | ICD-10-CM

## 2021-03-19 DIAGNOSIS — R079 Chest pain, unspecified: Secondary | ICD-10-CM | POA: Diagnosis not present

## 2021-03-19 DIAGNOSIS — G8929 Other chronic pain: Secondary | ICD-10-CM

## 2021-03-19 DIAGNOSIS — R002 Palpitations: Secondary | ICD-10-CM

## 2021-03-19 MED ORDER — METOPROLOL SUCCINATE ER 25 MG PO TB24: 25.0000 mg | ORAL_TABLET | Freq: Every day | ORAL | 1 refills | Status: DC

## 2021-03-19 NOTE — Patient Instructions (Addendum)
Medication Instructions:  Stop Lisinopril-Hydrochlorothiazide  Start Metoprolol Succinate 25 mg daily   *If you need a refill on your cardiac medications before your next appointment, please call your pharmacy*    Testing/Procedures: Echocardiogram - Your physician has requested that you have an echocardiogram. Echocardiography is a painless test that uses sound waves to create images of your heart. It provides your doctor with information about the size and shape of your heart and how well your heart's chambers and valves are working. This procedure takes approximately one hour. There are no restrictions for this procedure. This will be performed at our [facility] location - [address], [address].    Follow-Up: At [facility], you and your health needs are our priority.  As part of our continuing mission to provide you with exceptional heart care, we have created designated Provider Care Teams.  These Care Teams include your primary Cardiologist (physician) and Advanced Practice Providers (APPs -  Physician Assistants and Nurse Practitioners) who all work together to provide you with the care you need, when you need it.  We recommend signing up for the patient portal called "MyChart".  Sign up information is provided on this After Visit Summary.  MyChart is used to connect with patients for Virtual Visits (Telemedicine).  Patients are able to view lab/test results, encounter notes, upcoming appointments, etc.  Non-urgent messages can be sent to your provider as well.   To learn more about what you can do with MyChart, go to [email or web address].    Your next appointment:   4 month(s)  The format for your next appointment:   In Person  Provider:   Jodelle Red, MD   Other Instructions Keep a BP/HR log for 2 weeks- and let us know [phone]

## 2021-03-26 LAB — HEPATIC FUNCTION PANEL
ALT: 27 (ref 10–40)
AST: 35 (ref 14–40)
Alkaline Phosphatase: 43 (ref 25–125)
Bilirubin, Total: 0.5

## 2021-03-26 LAB — BASIC METABOLIC PANEL
BUN: 17 (ref 4–21)
Creatinine: 1.2 (ref 0.6–1.3)
Glucose: 91

## 2021-03-26 LAB — LIPID PANEL
Cholesterol: 190 (ref 0–200)
HDL: 61 (ref 35–70)
LDL Cholesterol: 110
Triglycerides: 92 (ref 40–160)

## 2021-03-26 LAB — COMPREHENSIVE METABOLIC PANEL
Albumin: 4.5 (ref 3.5–5.0)
Globulin: 2.6

## 2021-04-09 DIAGNOSIS — I10 Essential (primary) hypertension: Secondary | ICD-10-CM

## 2021-04-10 MED ORDER — LISINOPRIL 10 MG PO TABS: 10.0000 mg | ORAL_TABLET | Freq: Every day | ORAL | 2 refills | Status: DC

## 2021-04-10 NOTE — Telephone Encounter (Signed)
Addressed in other MyChart message from today

## 2021-04-10 NOTE — Telephone Encounter (Signed)
Please see other MyChart message from today where we are addressing BP/HR.

## 2021-04-16 ENCOUNTER — Other Ambulatory Visit (HOSPITAL_COMMUNITY): Payer: PRIVATE HEALTH INSURANCE

## 2021-04-21 NOTE — Telephone Encounter (Signed)
I would still like patient to have Echo. This was ordered for further evaluation of near syncope. We can wait at least 2 weeks from when he test positive for COVID but I still think he should have it.

## 2021-04-28 ENCOUNTER — Other Ambulatory Visit: Payer: Self-pay

## 2021-04-28 DIAGNOSIS — I10 Essential (primary) hypertension: Secondary | ICD-10-CM

## 2021-04-29 ENCOUNTER — Encounter: Payer: Self-pay | Admitting: Family Medicine

## 2021-04-29 LAB — BASIC METABOLIC PANEL
BUN/Creatinine Ratio: 17 (ref 10–24)
BUN: 17 mg/dL (ref 8–27)
CO2: 23 mmol/L (ref 20–29)
Calcium: 9.5 mg/dL (ref 8.6–10.2)
Chloride: 101 mmol/L (ref 96–106)
Creatinine, Ser: 1 mg/dL (ref 0.76–1.27)
Glucose: 116 mg/dL — ABNORMAL HIGH (ref 65–99)
Potassium: 4.6 mmol/L (ref 3.5–5.2)
Sodium: 138 mmol/L (ref 134–144)
eGFR: 86 mL/min/{1.73_m2} (ref >59)

## 2021-05-05 ENCOUNTER — Encounter: Payer: Self-pay | Admitting: Family Medicine

## 2021-05-15 ENCOUNTER — Ambulatory Visit (HOSPITAL_COMMUNITY): Payer: PRIVATE HEALTH INSURANCE | Attending: Cardiovascular Disease

## 2021-05-15 ENCOUNTER — Other Ambulatory Visit: Payer: Self-pay

## 2021-05-15 DIAGNOSIS — R002 Palpitations: Secondary | ICD-10-CM | POA: Diagnosis not present

## 2021-05-15 DIAGNOSIS — R55 Syncope and collapse: Secondary | ICD-10-CM

## 2021-05-15 LAB — ECHOCARDIOGRAM COMPLETE
Area-P 1/2: 4.06 cm2
S' Lateral: 3 cm

## 2021-05-18 NOTE — Telephone Encounter (Signed)
Reviewed BP logs. Overall, BP looks OK. He had an occasional elevated reading and one episode where systolic BP was in the 90s but mostly at goal. Metoprolol can cause some fatigue, decreased energy, as well as bloating. I think we should stop Metoprolol and try switching to Carvedilol (similar medicine but some patient tolerate this better and is also better at controlling BP). If patient is willing, I would start with Carvedilol 3.125mg  twice daily and then have patient keep an eye on his BP.   I saw patient's other MyChart message a well. Possible that some of his shortness of breath is due to recent COVID. I would like patient to keep an eye on his weight though. He should let us know if his weight continues to go up, breathing worsens, or he develops any new or worsening lower extremity edema.  Thank you!

## 2021-05-19 ENCOUNTER — Other Ambulatory Visit: Payer: PRIVATE HEALTH INSURANCE

## 2021-05-19 MED ORDER — CARVEDILOL 3.125 MG PO TABS: 3.1250 mg | ORAL_TABLET | Freq: Two times a day (BID) | ORAL | 3 refills | Status: DC

## 2021-05-23 ENCOUNTER — Other Ambulatory Visit: Payer: Self-pay | Admitting: Family Medicine

## 2021-05-23 DIAGNOSIS — R350 Frequency of micturition: Secondary | ICD-10-CM

## 2021-05-23 DIAGNOSIS — N401 Enlarged prostate with lower urinary tract symptoms: Secondary | ICD-10-CM

## 2021-05-23 DIAGNOSIS — R300 Dysuria: Secondary | ICD-10-CM

## 2021-06-16 ENCOUNTER — Other Ambulatory Visit: Payer: Self-pay | Admitting: Family Medicine

## 2021-06-16 DIAGNOSIS — J302 Other seasonal allergic rhinitis: Secondary | ICD-10-CM

## 2021-06-17 ENCOUNTER — Ambulatory Visit (INDEPENDENT_AMBULATORY_CARE_PROVIDER_SITE_OTHER): Payer: PRIVATE HEALTH INSURANCE | Admitting: Family Medicine

## 2021-06-17 ENCOUNTER — Encounter: Payer: Self-pay | Admitting: Family Medicine

## 2021-06-17 ENCOUNTER — Other Ambulatory Visit: Payer: Self-pay

## 2021-06-17 VITALS — BP 122/80 | HR 83 | Temp 98.0°F | Wt 197.8 lb

## 2021-06-17 DIAGNOSIS — I10 Essential (primary) hypertension: Secondary | ICD-10-CM

## 2021-06-17 DIAGNOSIS — I491 Atrial premature depolarization: Secondary | ICD-10-CM | POA: Diagnosis not present

## 2021-06-17 DIAGNOSIS — R140 Abdominal distension (gaseous): Secondary | ICD-10-CM | POA: Diagnosis not present

## 2021-06-17 DIAGNOSIS — M791 Myalgia, unspecified site: Secondary | ICD-10-CM

## 2021-06-17 DIAGNOSIS — K219 Gastro-esophageal reflux disease without esophagitis: Secondary | ICD-10-CM | POA: Diagnosis not present

## 2021-06-17 MED ORDER — PROPRANOLOL HCL 10 MG PO TABS: 10.0000 mg | ORAL_TABLET | Freq: Two times a day (BID) | ORAL | 2 refills | Status: DC

## 2021-06-17 MED ORDER — PANTOPRAZOLE SODIUM 20 MG PO TBEC: 20.0000 mg | DELAYED_RELEASE_TABLET | Freq: Every day | ORAL | 3 refills | Status: DC

## 2021-06-17 NOTE — Progress Notes (Signed)
Subjective:    Patient ID: Dustin [name], male    DOB: 06/15/1961, 60 y.o.   MRN: [id number]  Chief Complaint  Patient presents with   Bloated    N and off since may, tried tums and rolaids    HPI Patient was seen today for acute concern.  Pt endorses nausea, loose stools, bloating since May.  Pt recently treated with amoxicillin at [facility] for sore throat.  Notes some improvement in symptoms but increased heartburn and dysphagia since completion of ABX.   Also notes achiness in legs since starting Coreg on 05/19/2021.  Pt previously on metoprolol which was DC'd by cardiology 2/2 fatigue.  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    Allergies  Allergen Reactions   Novocain [Procaine] Swelling    ROS General: Denies fever, chills, night sweats, changes in weight, changes in appetite HEENT: Denies headaches, ear pain, changes in vision, rhinorrhea, sore throat CV: Denies CP, palpitations, SOB, orthopnea Pulm: Denies SOB, cough, wheezing GI: Denies abdominal pain, nausea, vomiting, diarrhea, constipation  +bloating, loose stools GU: Denies dysuria, hematuria, frequency Msk: Denies muscle cramps, joint pains Neuro: Denies weakness, numbness, tingling Skin: Denies rashes, bruising Psych: Denies depression, anxiety, hallucinations    Objective:    Blood pressure 122/80, pulse 83, temperature 98 F (36.7 C), temperature source Oral, weight 197 lb 12.8 oz (89.7 kg), SpO2 96 %.  Gen. Pleasant, well-nourished, in no distress, normal affect   HEENT: [place]/AT, face symmetric, conjunctiva clear, no scleral icterus, PERRLA, EOMI, nares patent without drainage, pharynx without erythema or exudate. Neck: No JVD, no thyromegaly, no carotid bruits Lungs: no accessory muscle use, CTAB, no wheezes or rales Cardiovascular: irregularly irregular no m/r/g, no peripheral edema Abdomen: BS  present, soft, NT/ND, no hepatosplenomegaly. Musculoskeletal: No deformities, no cyanosis or clubbing, normal tone Neuro:  A&Ox3, CN II-XII intact, normal gait Skin:  Warm, no lesions/ rash   Wt Readings from Last 3 Encounters:  06/17/21 197 lb 12.8 oz (89.7 kg)  03/19/21 195 lb 12.8 oz (88.8 kg)  02/25/21 193 lb 9.6 oz (87.8 kg)    Lab Results  Component Value Date   WBC 6.9 02/25/2021   HGB 15.3 02/25/2021   HCT 44.7 02/25/2021   PLT 301.0 02/25/2021   GLUCOSE 116 (H) 04/28/2021   CHOL 190 03/26/2021   TRIG 92 03/26/2021   HDL 61 03/26/2021   LDLCALC 110 03/26/2021   ALT 27 03/26/2021   AST 35 03/26/2021   NA 138 04/28/2021   K 4.6 04/28/2021   CL 101 04/28/2021   CREATININE 1.00 04/28/2021   BUN 17 04/28/2021   CO2 23 04/28/2021   TSH 0.86 02/11/2021   PSA 0.51 02/25/2021   INR 1.0 04/20/2019   HGBA1C 5.6 02/11/2021    Assessment/Plan:  Bloating  -Discussed possible causes including medications, IBS -probiotic, lifestyle modifications -will d/c Coreg 3.25 mg twice daily and start propranolol 10 mg twice daily - Plan: CMP  Myalgia  -Discussed possible causes including recent change in medication, electrolyte deficiency, rhabdo -Obtain labs -We will stop Coreg 3.25 mg twice daily and start propranolol 10 mg twice daily to see if patient notes improvement in symptoms. - Plan: CBC with Differential/Platelet, CMP, CKMB  Premature atrial contractions  -EKG obtained this visit.  Sinus rhythm with PACs.  Previous studies reviewed for comparison -Discussed treatment options including beta-blockers. -Discussed possible  intolerance to Coreg as may be contributing to myalgias.  We will start propranolol 10 mg twice daily to see if patient notices a difference -Continue follow-up with cardiology - Plan: propranolol (INDERAL) 10 MG tablet, TSH, T4, Free, EKG 12-lead  Gastroesophageal reflux disease, unspecified whether esophagitis present -Discussed possible causes  including hiatal hernia -Start PPI -For continued or worsening symptoms follow-up with GI for EGD  - Plan: pantoprazole (PROTONIX) 20 MG tablet  Essential hypertension -Controlled -We will d/c Coreg 3.25 mg twice daily restart propranolol 10 mg twice daily.  Concern Coreg may be contributing to myalgias and bloating -Continue follow-up with cardiology - Plan: propranolol (INDERAL) 10 MG tablet, CMP  F/u prn  Abbe Amsterdam, MD

## 2021-06-18 LAB — COMPREHENSIVE METABOLIC PANEL
ALT: 26 U/L (ref 0–53)
AST: 24 U/L (ref 0–37)
Albumin: 4.2 g/dL (ref 3.5–5.2)
Alkaline Phosphatase: 42 U/L (ref 39–117)
BUN: 24 mg/dL — ABNORMAL HIGH (ref 6–23)
CO2: 28 mEq/L (ref 19–32)
Calcium: 9.6 mg/dL (ref 8.4–10.5)
Chloride: 102 mEq/L (ref 96–112)
Creatinine, Ser: 1.29 mg/dL (ref 0.40–1.50)
GFR: 59.68 mL/min — ABNORMAL LOW (ref >60.00)
Glucose, Bld: 94 mg/dL (ref 70–99)
Potassium: 4.4 mEq/L (ref 3.5–5.1)
Sodium: 138 mEq/L (ref 135–145)
Total Bilirubin: 0.4 mg/dL (ref 0.2–1.2)
Total Protein: 7 g/dL (ref 6.0–8.3)

## 2021-06-18 LAB — CBC WITH DIFFERENTIAL/PLATELET
Basophils Absolute: 0.1 10*3/uL (ref 0.0–0.1)
Basophils Relative: 1.3 % (ref 0.0–3.0)
Eosinophils Absolute: 0.1 10*3/uL (ref 0.0–0.7)
Eosinophils Relative: 0.9 % (ref 0.0–5.0)
HCT: 39.8 % (ref 39.0–52.0)
Hemoglobin: 13.8 g/dL (ref 13.0–17.0)
Lymphocytes Relative: 25 % (ref 12.0–46.0)
Lymphs Abs: 1.7 10*3/uL (ref 0.7–4.0)
MCHC: 34.8 g/dL (ref 30.0–36.0)
MCV: 90.3 fl (ref 78.0–100.0)
Monocytes Absolute: 0.5 10*3/uL (ref 0.1–1.0)
Monocytes Relative: 7.3 % (ref 3.0–12.0)
Neutro Abs: 4.3 10*3/uL (ref 1.4–7.7)
Neutrophils Relative %: 65.5 % (ref 43.0–77.0)
Platelets: 300 10*3/uL (ref 150.0–400.0)
RBC: 4.41 Mil/uL (ref 4.22–5.81)
RDW: 13.1 % (ref 11.5–15.5)
WBC: 6.6 10*3/uL (ref 4.0–10.5)

## 2021-06-18 LAB — T4, FREE: Free T4: 0.73 ng/dL (ref 0.60–1.60)

## 2021-06-18 LAB — CREATININE KINASE MB: CK-MB: 1.1 ng/mL (ref 0.3–4.0)

## 2021-06-18 LAB — TSH: TSH: 1.02 u[IU]/mL (ref 0.35–5.50)

## 2021-06-19 ENCOUNTER — Encounter: Payer: Self-pay | Admitting: Family Medicine

## 2021-06-27 ENCOUNTER — Other Ambulatory Visit: Payer: Self-pay | Admitting: Family Medicine

## 2021-06-27 DIAGNOSIS — I10 Essential (primary) hypertension: Secondary | ICD-10-CM

## 2021-06-27 DIAGNOSIS — I491 Atrial premature depolarization: Secondary | ICD-10-CM

## 2021-06-27 DIAGNOSIS — K219 Gastro-esophageal reflux disease without esophagitis: Secondary | ICD-10-CM

## 2021-06-29 ENCOUNTER — Other Ambulatory Visit: Payer: Self-pay | Admitting: Student

## 2021-06-29 ENCOUNTER — Encounter (HOSPITAL_BASED_OUTPATIENT_CLINIC_OR_DEPARTMENT_OTHER): Payer: Self-pay

## 2021-06-29 DIAGNOSIS — I10 Essential (primary) hypertension: Secondary | ICD-10-CM

## 2021-07-20 ENCOUNTER — Encounter (HOSPITAL_BASED_OUTPATIENT_CLINIC_OR_DEPARTMENT_OTHER): Payer: Self-pay | Admitting: Cardiology

## 2021-07-20 ENCOUNTER — Other Ambulatory Visit: Payer: Self-pay

## 2021-07-20 ENCOUNTER — Ambulatory Visit (INDEPENDENT_AMBULATORY_CARE_PROVIDER_SITE_OTHER): Payer: PRIVATE HEALTH INSURANCE | Admitting: Cardiology

## 2021-07-20 VITALS — BP 144/92 | HR 80 | Ht 73.0 in | Wt 198.6 lb

## 2021-07-20 DIAGNOSIS — Z7189 Other specified counseling: Secondary | ICD-10-CM | POA: Diagnosis not present

## 2021-07-20 DIAGNOSIS — I10 Essential (primary) hypertension: Secondary | ICD-10-CM

## 2021-07-20 DIAGNOSIS — I471 Supraventricular tachycardia: Secondary | ICD-10-CM

## 2021-07-20 DIAGNOSIS — Z87898 Personal history of other specified conditions: Secondary | ICD-10-CM | POA: Diagnosis not present

## 2021-07-20 NOTE — Patient Instructions (Addendum)
Medication Instructions:  Stop beta blocker (CARVEDILOL)  see if stools and bloating normalize. Give it a few weeks. If palpitations worsen, we can try to use a different class of medication called calcium channel blockers.   *If you need a refill on your cardiac medications before your next appointment, please call your pharmacy*  Lab Work: NONE   Testing/Procedures: NONE  Follow-Up: At [facility], you and your health needs are our priority.  As part of our continuing mission to provide you with exceptional heart care, we have created designated Provider Care Teams.  These Care Teams include your primary Cardiologist (physician) and Advanced Practice Providers (APPs -  Physician Assistants and Nurse Practitioners) who all work together to provide you with the care you need, when you need it.  We recommend signing up for the patient portal called "MyChart".  Sign up information is provided on this After Visit Summary.  MyChart is used to connect with patients for Virtual Visits (Telemedicine).  Patients are able to view lab/test results, encounter notes, upcoming appointments, etc.  Non-urgent messages can be sent to your provider as well.   To learn more about what you can do with MyChart, go to [email or web address].    Your next appointment:   08/20/2021 AT 3:30 WITH Ronn Melena NP

## 2021-07-20 NOTE — Progress Notes (Signed)
Cardiology Office Note:    Date:  07/20/2021   ID:  Dustin [name], DOB 01/23/1961, MRN [id number]  PCP:  Billie Ruddy, MD  Cardiologist:  Buford Dresser, MD PhD  Referring MD: Billie Ruddy, MD   CC: follow up  History of Present Illness:    Dustin [name] is a 60 y.o. male with a hx of HTN, strong FH of CAD who is seen for post hospital follow up. I met him in the hospital on 04/19/19 during an [place] for chest pain.  Cardiac history: longstanding chest pain, normal myoview 2017. Ct coronary 04/2019 during admission for chest pain with calcium score of 0, normal cors, no evidence of CAD. PA measured 37 mm. CT angio chest/dissection done the day prior was unremarkable.  Today: Overall, he is feeling good. At home, his heart rate and blood pressure has generally been stable, including with exertion.   Occasionally he notices short episodes of palpitations followed by a cough.  Previously, Metoprolol was helping for a few weeks, but then he became bloated, fatigued, and his blood pressure began increasing. His PCP recommends stopping carvedilol, but he wished to discuss this today.   When he does experience bloating, he has some pressure in his chest. This is not limiting his physical activity. For activity he is able to work heavily in his yard.   Every now and then he has sharp central chest pains. He states this pain is more mild than before, and is not overly concerned about this.  Since beginning the beta-blocker, he also has jaw tightness slightly higher than baseline.  Additionally, he endorses diarrhea daily for the past month. His stool has been a light tan color, but he denies hematochezia.   For exercise, he has returned to the gym. On the treadmill he has built up to 30 minutes at a maximum speed of 5 and feels great while exercising. He is trying to exercise 4 times a week. Lightheadedness occurs while using some strength training machines, and he needs to  stand up slowly after using the leg press.  In the past 2 years, his eating habits have remained unchanged. He continues to follow the [employer]. Daily he eats an apple, and replaces salt with garlic powder.  At one time he was infected with COVID.  He denies any shortness of breath. No headaches, syncope, orthopnea, or PND. Also has no lower extremity edema.   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   fluticasone (FLONASE) 50 MCG/ACT nasal spray SPRAY 1 SPRAY INTO BOTH NOSTRILS DAILY.   lisinopril (ZESTRIL) 10 MG tablet TAKE 1 TABLET BY MOUTH EVERY DAY   Multiple Vitamin (MULTIVITAMIN WITH MINERALS) TABS tablet Take 1 tablet by mouth daily.   tamsulosin (FLOMAX) 0.4 MG CAPS capsule TAKE 1 CAPSULE BY MOUTH EVERY DAY   pantoprazole (PROTONIX) 20 MG tablet Take 1 tablet (20 mg total) by mouth daily. (Patient not taking: Reported on 07/20/2021)   No current facility-administered  medications on file prior to visit.     Allergies:   Novocain [procaine]   Social History   Tobacco Use   Smoking status: Never   Smokeless tobacco: Never  Substance Use Topics   Alcohol use: Yes    Comment: occ beer   Drug use: No    Family History: The patient's family history includes CAD in his father; Cancer in his sister; Heart disease in his sister; Hypertension in his mother. There is no history of Colon cancer, Colon polyps, Rectal cancer, or Stomach cancer.  ROS:   Please see the history of present illness.   (+)  Bloating (+) Diarrhea (+) Lightheadedness (+) Palpitations (+) Cough (+) Central chest pain (+) Jaw tightness Additional pertinent ROS otherwise unremarkable.  EKGs/Labs/Other Studies Reviewed:    The following studies were reviewed today:  Echo 05/15/2021:  1. Left ventricular ejection fraction, by estimation, is 60 to 65%. Left  ventricular ejection fraction by 3D volume is 64 %. The left ventricle has  normal function. The left ventricle has no regional wall motion  abnormalities. Left ventricular diastolic   parameters are consistent with Grade I diastolic dysfunction (impaired  relaxation).   2. Right ventricular systolic function is normal. The right ventricular  size is normal.   3. The mitral valve is normal in structure. No evidence of mitral valve  regurgitation. No evidence of mitral stenosis.   4. The aortic valve is normal in structure. Aortic valve regurgitation is  not visualized. No aortic stenosis is present.   5. The inferior vena cava is normal in size with greater than 50%  respiratory variability, suggesting right atrial pressure of 3 mmHg.   Monitor 09/2019 3 days of data recorded on Zio monitor. Patient had a min HR of 53 bpm, max HR of 182 bpm, and avg HR of 96 bpm. Predominant underlying rhythm was Sinus Rhythm. No VT, atrial fibrillation, high degree block, or pauses noted. Isolated atrial ectopy was rare (<1%). PVCs were frequent (6.4%), with rare couplets/triplets and rare bigeminy/trigeminy. There were 35 triggered events, which were sinus rhythm with ectopy. 6 brief Supraventricular Tachycardia runs occurred, the run with the fastest interval lasting 6 beats with a max rate of 182 bpm, the longest lasting 9 beats with an avg rate of 137 bpm.  CT coronary 04/20/19 -Ca score 0, no evidence of CAD  Exercise nuclear stress 08/26/2016 Nuclear stress EF: 61%. Normal LV function There was no ST segment deviation noted during stress. The study is normal. No  ischemia . No infarction This is a low risk study.  EKG:  EKG is personally reviewed.   07/20/2021: not ordered 10/06/2020: sinus rhythm with sinus arrhythmia at 74 bpm  Recent Labs: 06/17/2021: ALT 26; BUN 24; Creatinine, Ser 1.29; Hemoglobin 13.8; Platelets 300.0; Potassium 4.4; Sodium 138; TSH 1.02  Recent Lipid Panel    Component Value Date/Time   CHOL 190 03/26/2021 0000   TRIG 92 03/26/2021 0000   HDL 61 03/26/2021 0000   CHOLHDL 4 11/06/2020 0901   VLDL 33.0 11/06/2020 0901   LDLCALC 110 03/26/2021 0000    Physical Exam:    VS:  BP (!) 144/92   Pulse 80   Ht _0  (1.854 m)   Wt 198 lb 9.6 oz (90.1 kg)   BMI 26.20 kg/m     Wt Readings from Last 3 Encounters:  07/20/21 198 lb 9.6 oz (90.1 kg)  06/17/21 197 lb 12.8 oz (89.7 kg)  03/19/21 195 lb 12.8  oz (88.8 kg)    GEN: Well nourished, well developed in no acute distress HEENT: Normal, moist mucous membranes NECK: No JVD CARDIAC: regular rhythm, normal S1 and S2, no rubs or gallops. No murmur. VASCULAR: Radial and DP pulses 2+ bilaterally. No carotid bruits RESPIRATORY:  Clear to auscultation without rales, wheezing or rhonchi  ABDOMEN: Soft, non-tender, non-distended MUSCULOSKELETAL:  Ambulates independently SKIN: Warm and dry, no edema NEUROLOGIC:  Alert and oriented x 3. No focal neuro deficits noted. PSYCHIATRIC:  Normal affect    ASSESSMENT:    1. Essential hypertension   2. Paroxysmal SVT (supraventricular tachycardia) ([place])   3. History of chest pain   4. Primary hypertension   5. Cardiac risk counseling   6. Counseling on health promotion and disease prevention     PLAN:    Palpitations:  -monitor shows show rare/brief paroxysmal SVT and 6% PVC burden -tried beta blockers, concern that bloating and diarrhea may be due to this. Will trial time off beta blockers and monitor for resolution of symptoms. If symptoms improve off beta blockers, would consider calcium channel blockers for  SVT/PVCs  Hypertension: elevated today, but reviewed home logs, has been well controlled at home. Had prior episodes of hypotension, monitor -continue lisinopril 10 mg daily -trialing hold of beta blocker as above  History of Chest pain:  CT coronary without plaque or calcium -we previously discussed guidelines and recommendations for statin based on calcium score of 0.   Cardiac risk counseling and prevention recommendations: -recommend heart healthy/Mediterranean diet, with whole grains, fruits, vegetable, fish, lean meats, nuts, and olive oil. Limit salt. -recommend moderate walking, 3-5 times/week for 30-50 minutes each session. Aim for at least 150 minutes.week. Goal should be pace of 3 miles/hours, or walking 1.5 miles in 30 minutes -recommend avoidance of tobacco products. Avoid excess alcohol.  Plan for follow up: 2 years or sooner PRN.  Buford Dresser, MD, PhD [place]  [facility]   Medication Adjustments/Labs and Tests Ordered: Current medicines are reviewed at length with the patient today.  Concerns regarding medicines are outlined above.  No orders of the defined types were placed in this encounter.  No orders of the defined types were placed in this encounter.   Patient Instructions  Medication Instructions:  Stop beta blocker (CARVEDILOL)  see if stools and bloating normalize. Give it a few weeks. If palpitations worsen, we can try to use a different class of medication called calcium channel blockers.   *If you need a refill on your cardiac medications before your next appointment, please call your pharmacy*  Lab Work: NONE   Testing/Procedures: NONE  Follow-Up: At [facility], you and your health needs are our priority.  As part of our continuing mission to provide you with exceptional heart care, we have created designated Provider Care Teams.  These Care Teams include your primary Cardiologist (physician) and Advanced Practice Providers  (APPs -  Physician Assistants and Nurse Practitioners) who all work together to provide you with the care you need, when you need it.  We recommend signing up for the patient portal called "MyChart".  Sign up information is provided on this After Visit Summary.  MyChart is used to connect with patients for Virtual Visits (Telemedicine).  Patients are able to view lab/test results, encounter notes, upcoming appointments, etc.  Non-urgent messages can be sent to your provider as well.   To learn more about what you can do with MyChart, go to [email or web address].    Your next  appointment:   08/20/2021 AT 3:30 WITH Overton Mam NP     I,Mathew Stumpf,acting as a scribe for Buford Dresser, MD.,have documented all relevant documentation on the behalf of Buford Dresser, MD,as directed by  Buford Dresser, MD while in the presence of Buford Dresser, MD.  I, Buford Dresser, MD, have reviewed all documentation for this visit. The documentation on 07/20/21 for the exam, diagnosis, procedures, and orders are all accurate and complete.   Signed, Buford Dresser, MD PhD 07/20/2021 6:26 PM    [place]

## 2021-08-09 ENCOUNTER — Other Ambulatory Visit: Payer: Self-pay | Admitting: Family Medicine

## 2021-08-09 DIAGNOSIS — I10 Essential (primary) hypertension: Secondary | ICD-10-CM

## 2021-08-09 DIAGNOSIS — K219 Gastro-esophageal reflux disease without esophagitis: Secondary | ICD-10-CM

## 2021-08-09 DIAGNOSIS — I491 Atrial premature depolarization: Secondary | ICD-10-CM

## 2021-08-14 ENCOUNTER — Ambulatory Visit: Payer: PRIVATE HEALTH INSURANCE | Admitting: Family Medicine

## 2021-08-20 ENCOUNTER — Ambulatory Visit (INDEPENDENT_AMBULATORY_CARE_PROVIDER_SITE_OTHER): Payer: PRIVATE HEALTH INSURANCE | Admitting: Family

## 2021-08-20 ENCOUNTER — Other Ambulatory Visit: Payer: Self-pay

## 2021-08-20 ENCOUNTER — Encounter (HOSPITAL_BASED_OUTPATIENT_CLINIC_OR_DEPARTMENT_OTHER): Payer: Self-pay | Admitting: Family

## 2021-08-20 VITALS — BP 132/84 | HR 93 | Ht 73.0 in | Wt 198.0 lb

## 2021-08-20 DIAGNOSIS — R002 Palpitations: Secondary | ICD-10-CM | POA: Diagnosis not present

## 2021-08-20 DIAGNOSIS — I10 Essential (primary) hypertension: Secondary | ICD-10-CM | POA: Diagnosis not present

## 2021-08-20 DIAGNOSIS — I493 Ventricular premature depolarization: Secondary | ICD-10-CM | POA: Diagnosis not present

## 2021-08-20 MED ORDER — DILTIAZEM HCL 30 MG PO TABS: 30.0000 mg | ORAL_TABLET | Freq: Two times a day (BID) | ORAL | 5 refills | Status: DC | PRN

## 2021-08-20 NOTE — Patient Instructions (Addendum)
Medication Instructions:  Your physician has recommended you make the following change in your medication:   START Diltiazem as needed for palpitations.   You may take it twice per day if needed. This is a calcium channel blocker to help prevent palpitations such as PACs or PVC.    *If you need a refill on your cardiac medications before your next appointment, please call your pharmacy*   Lab Work: None ordered today.   Testing/Procedures: None ordered today.   Follow-Up: At [facility], you and your health needs are our priority.  As part of our continuing mission to provide you with exceptional heart care, we have created designated Provider Care Teams.  These Care Teams include your primary Cardiologist (physician) and Advanced Practice Providers (APPs -  Physician Assistants and Nurse Practitioners) who all work together to provide you with the care you need, when you need it.  We recommend signing up for the patient portal called "MyChart".  Sign up information is provided on this After Visit Summary.  MyChart is used to connect with patients for Virtual Visits (Telemedicine).  Patients are able to view lab/test results, encounter notes, upcoming appointments, etc.  Non-urgent messages can be sent to your provider as well.   To learn more about what you can do with MyChart, go to [email or web address].    Your next appointment:   1 year(s)  The format for your next appointment:   In Person  Provider:   Jodelle Red, MD   Other Instructions  To prevent palpitations: Make sure you are adequately hydrated.  Avoid and/or limit caffeine containing beverages like soda or tea. Exercise regularly.  Manage stress well. Some over the counter medications can cause palpitations such as Benadryl, AdvilPM, TylenolPM. Regular Advil or Tylenol do not cause palpitations.    Heart Healthy Diet Recommendations: A low-salt diet is recommended. Meats should be grilled, baked,  or boiled. Avoid fried foods. Focus on lean protein sources like fish or chicken with vegetables and fruits. The American Heart Association is a [employer]!    Exercise recommendations: The American Heart Association recommends 150 minutes of moderate intensity exercise weekly. Try 30 minutes of moderate intensity exercise 4-5 times per week. This could include walking, jogging, or swimming.

## 2021-08-20 NOTE — Progress Notes (Signed)
Office Visit    Patient Name: Dustin [name] Date of Encounter: 08/20/2021  PCP:  [name], MD   [facility]  Cardiologist:  Jodelle Red, MD  Advanced Practice Provider:  No care team member to display Electrophysiologist:  None      Chief Complaint    Dustin [name] is a 60 y.o. 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 male with a hx of hypertension, palpitations, strong family history of CAD last seen 07/20/21 by Dr. Cristal Deer.  He has had a longstanding history of chest pain with normal Lexiscan Myoview in 2017.  CT coronary scoring 04/2019 during mission for chest pain with calcium score of 0.  He was last seen 07/20/2021 by Dr. Cristal Deer.  He was being treated for palpitations.  He was initially treated with metoprolol though felt bloated, fatigued and it was discontinued.  He was then started on carvedilol but noted persistent diarrhea and it was discontinued for that reason.  He presents today for follow-up.  Notes his previous diarrhea has resolved with his discontinuation of carvedilol.  Reports no chest pain, pressure, tightness.  He is a member of the [facility] [employer] well fitness center and is working to go more regularly.  Endorses following a heart healthy diet.  Tells me he notices fewer palpitations when he stays well-hydrated.  He does note since discontinuation of carvedilol he  has had intermittent palpitations which are overall not bothersome.  No lightheadedness, dizziness, near-syncope, syncope.  No dyspnea on exertion, orthopnea, edema, PND.\  EKGs/Labs/Other Studies Reviewed:   The following studies were reviewed today:  Echo 05/15/2021:  1. Left ventricular ejection fraction, by estimation, is 60 to 65%. Left  ventricular ejection fraction by 3D volume is 64 %. The left ventricle has  normal function. The left ventricle has no regional wall motion  abnormalities. Left ventricular diastolic   parameters are consistent with Grade I diastolic dysfunction (impaired  relaxation).   2. Right ventricular systolic function is normal. The right ventricular  size is normal.   3. The mitral valve is normal in structure. No evidence of mitral valve  regurgitation. No evidence of mitral stenosis.   4. The aortic valve is normal in structure. Aortic valve regurgitation is  not visualized. No aortic stenosis is present.   5. The inferior vena cava is normal in size with greater than 50%  respiratory variability, suggesting right atrial pressure of 3 mmHg.    Monitor 09/2019 3 days of data recorded on Zio monitor. Patient had a min HR of 53 bpm, max HR of 182 bpm, and avg HR of 96 bpm. Predominant underlying rhythm was Sinus Rhythm. No VT, atrial fibrillation, high degree block, or pauses noted. Isolated atrial ectopy was rare (<1%). PVCs were frequent (6.4%), with rare couplets/triplets and rare bigeminy/trigeminy. There were 35 triggered events, which  were sinus rhythm with ectopy. 6 brief Supraventricular Tachycardia runs occurred, the run with the fastest interval lasting 6 beats with a max rate of 182 bpm, the longest lasting 9 beats with an avg rate of 137 bpm.   CT coronary 04/20/19 -Ca score 0, no evidence of CAD   Exercise nuclear stress 08/26/2016 Nuclear stress EF: 61%. Normal LV function There was no ST segment deviation noted during stress. The study is  normal. No ischemia . No infarction This is a low risk study.  EKG:  No EKG today.   Recent Labs: 06/17/2021: ALT 26; BUN 24; Creatinine, Ser 1.29; Hemoglobin 13.8; Platelets 300.0; Potassium 4.4; Sodium 138; TSH 1.02  Recent Lipid Panel    Component Value Date/Time   CHOL 190 03/26/2021 0000   TRIG 92 03/26/2021 0000   HDL 61 03/26/2021 0000   CHOLHDL 4 11/06/2020 0901   VLDL 33.0 11/06/2020 0901   LDLCALC 110 03/26/2021 0000     Home Medications   Current Meds  Medication Sig   acetaminophen (TYLENOL) 500 MG tablet Take 1,000 mg by mouth every 6 (six) hours as needed for headache (pain).   diltiazem (CARDIZEM) 30 MG tablet Take 1 tablet (30 mg total) by mouth 2 (two) times daily as needed (As needed for palpitations.).   fluticasone (FLONASE) 50 MCG/ACT nasal spray SPRAY 1 SPRAY INTO BOTH NOSTRILS DAILY.   lisinopril (ZESTRIL) 10 MG tablet TAKE 1 TABLET BY MOUTH EVERY DAY   Multiple Vitamin (MULTIVITAMIN WITH MINERALS) TABS tablet Take 1 tablet by mouth daily.   pantoprazole (PROTONIX) 20 MG tablet TAKE 1 TABLET BY MOUTH EVERY DAY   tamsulosin (FLOMAX) 0.4 MG CAPS capsule TAKE 1 CAPSULE BY MOUTH EVERY DAY     Review of Systems      All other systems reviewed and are otherwise negative except as noted above.  Physical Exam    VS:  BP 132/84   Pulse 93   Ht 6\' 1"  (1.854 m)   Wt 198 lb (89.8 kg)   SpO2 99%   BMI 26.12 kg/m  , BMI Body mass index is 26.12 kg/m.  Wt Readings from Last 3 Encounters:  08/20/21 198 lb (89.8 kg)  07/20/21 198 lb 9.6 oz (90.1 kg)  06/17/21 197 lb 12.8 oz (89.7 kg)    GEN: Well nourished, well developed, in no acute distress. HEENT: normal. Neck: Supple, no JVD, carotid bruits, or masses. Cardiac: RRR, no murmurs, rubs, or gallops. No clubbing, cyanosis, edema.  Radials/PT 2+ and equal bilaterally.  Respiratory:  Respirations regular and unlabored, clear to auscultation bilaterally. GI: Soft, nontender, nondistended. MS: No deformity  or atrophy. Skin: Warm and dry, no rash. Neuro:  Strength and sensation are intact. Psych: Normal affect.  Assessment & Plan    Palpitations - Monitor with rare paroxysmal SVT and 6% PVC burden. Previous diarrhea has resolved since discontinuation of Carvedilol. Previous intolerance to Metoprolol with bloating. Continue to stay well hydrated, avoid caffeine. Palpitations are presently intermittent. Will Rx Diltiazem 30mg  PRN for persistent palpitations. If they become more bothersome, could transition to extended release dosing daily.   Hypertension - BP well controlled. Continue current antihypertensive regimen.    History of chest pain - CT coronary with calcium score of 0. No recurrent chest pain, pressure, tightness. Heart healthy diet and regular cardiovascular exercise encouraged.    Disposition: Follow up in 1 year(s) with Dr. 08/17/21 or APP.  Signed, , NP 08/20/2021, 5:10 PM [facility]  

## 2021-09-16 ENCOUNTER — Other Ambulatory Visit: Payer: Self-pay | Admitting: Family Medicine

## 2021-09-16 DIAGNOSIS — I491 Atrial premature depolarization: Secondary | ICD-10-CM

## 2021-09-16 DIAGNOSIS — I10 Essential (primary) hypertension: Secondary | ICD-10-CM

## 2021-11-11 ENCOUNTER — Encounter: Payer: Self-pay | Admitting: Family Medicine

## 2021-11-11 ENCOUNTER — Other Ambulatory Visit: Payer: Self-pay

## 2021-11-11 ENCOUNTER — Ambulatory Visit (INDEPENDENT_AMBULATORY_CARE_PROVIDER_SITE_OTHER): Payer: PRIVATE HEALTH INSURANCE | Admitting: Family Medicine

## 2021-11-11 ENCOUNTER — Ambulatory Visit (INDEPENDENT_AMBULATORY_CARE_PROVIDER_SITE_OTHER): Payer: PRIVATE HEALTH INSURANCE

## 2021-11-11 VITALS — BP 132/98 | HR 92 | Temp 97.8°F | Ht 73.0 in | Wt 193.6 lb

## 2021-11-11 DIAGNOSIS — Z125 Encounter for screening for malignant neoplasm of prostate: Secondary | ICD-10-CM

## 2021-11-11 DIAGNOSIS — I10 Essential (primary) hypertension: Secondary | ICD-10-CM

## 2021-11-11 DIAGNOSIS — Z0000 Encounter for general adult medical examination without abnormal findings: Secondary | ICD-10-CM

## 2021-11-11 DIAGNOSIS — R1032 Left lower quadrant pain: Secondary | ICD-10-CM | POA: Diagnosis not present

## 2021-11-11 DIAGNOSIS — R002 Palpitations: Secondary | ICD-10-CM | POA: Diagnosis not present

## 2021-11-11 DIAGNOSIS — R051 Acute cough: Secondary | ICD-10-CM

## 2021-11-11 DIAGNOSIS — B079 Viral wart, unspecified: Secondary | ICD-10-CM | POA: Diagnosis not present

## 2021-11-11 DIAGNOSIS — L570 Actinic keratosis: Secondary | ICD-10-CM

## 2021-11-11 LAB — CBC WITH DIFFERENTIAL/PLATELET
Basophils Absolute: 0 10*3/uL (ref 0.0–0.1)
Basophils Relative: 0.5 % (ref 0.0–3.0)
Eosinophils Absolute: 0.1 10*3/uL (ref 0.0–0.7)
Eosinophils Relative: 1.4 % (ref 0.0–5.0)
HCT: 48.6 % (ref 39.0–52.0)
Hemoglobin: 16.5 g/dL (ref 13.0–17.0)
Lymphocytes Relative: 17.7 % (ref 12.0–46.0)
Lymphs Abs: 1.3 10*3/uL (ref 0.7–4.0)
MCHC: 34 g/dL (ref 30.0–36.0)
MCV: 90.6 fl (ref 78.0–100.0)
Monocytes Absolute: 0.5 10*3/uL (ref 0.1–1.0)
Monocytes Relative: 7 % (ref 3.0–12.0)
Neutro Abs: 5.2 10*3/uL (ref 1.4–7.7)
Neutrophils Relative %: 73.4 % (ref 43.0–77.0)
Platelets: 324 10*3/uL (ref 150.0–400.0)
RBC: 5.36 Mil/uL (ref 4.22–5.81)
RDW: 13.3 % (ref 11.5–15.5)
WBC: 7.1 10*3/uL (ref 4.0–10.5)

## 2021-11-11 LAB — COMPREHENSIVE METABOLIC PANEL
ALT: 28 U/L (ref 0–53)
AST: 28 U/L (ref 0–37)
Albumin: 4.6 g/dL (ref 3.5–5.2)
Alkaline Phosphatase: 41 U/L (ref 39–117)
BUN: 17 mg/dL (ref 6–23)
CO2: 28 mEq/L (ref 19–32)
Calcium: 10.3 mg/dL (ref 8.4–10.5)
Chloride: 101 mEq/L (ref 96–112)
Creatinine, Ser: 1.23 mg/dL (ref 0.40–1.50)
GFR: 63.02 mL/min (ref >60.00)
Glucose, Bld: 104 mg/dL — ABNORMAL HIGH (ref 70–99)
Potassium: 5.2 mEq/L — ABNORMAL HIGH (ref 3.5–5.1)
Sodium: 136 mEq/L (ref 135–145)
Total Bilirubin: 0.8 mg/dL (ref 0.2–1.2)
Total Protein: 8.2 g/dL (ref 6.0–8.3)

## 2021-11-11 LAB — T4, FREE: Free T4: 0.82 ng/dL (ref 0.60–1.60)

## 2021-11-11 LAB — LIPID PANEL
Cholesterol: 201 mg/dL — ABNORMAL HIGH (ref 0–200)
HDL: 54.8 mg/dL (ref >39.00)
LDL Cholesterol: 110 mg/dL — ABNORMAL HIGH (ref 0–99)
NonHDL: 145.85
Total CHOL/HDL Ratio: 4
Triglycerides: 177 mg/dL — ABNORMAL HIGH (ref 0.0–149.0)
VLDL: 35.4 mg/dL (ref 0.0–40.0)

## 2021-11-11 LAB — PSA: PSA: 0.55 ng/mL (ref 0.10–4.00)

## 2021-11-11 LAB — TSH: TSH: 1.4 u[IU]/mL (ref 0.35–5.50)

## 2021-11-11 LAB — HEMOGLOBIN A1C: Hgb A1c MFr Bld: 5.9 % (ref 4.6–6.5)

## 2021-11-11 NOTE — Progress Notes (Signed)
Subjective:     Dustin [name] is a 61 y.o. male and is here for a comprehensive physical exam. The patient reports ongoing cough/throat clearing.  At times productive with clear sputum.  Symptoms started the week before Christmas when patient had URI symptoms including sore throat.  Taking OTC Mucinex.  Noticing occasional palpitation since coughing.  Patient notes intermittent BP elevation.  BP range at home 106-139/70-98.  Endorses being dehydrated, not going to the gym in the last 2 months, and eating out more..  States mouth is dry.  Patient inquires about spots on right foot.  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  Had influenza vaccine in late October at [facility].  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   Social History   Socioeconomic History   Marital status: Married    Spouse name: Not on file   Number of children: Not on file   Years of education: Not on file   Highest education level: Not on file  Occupational History   Occupation: Theme park manager: [employer]  Tobacco Use   Smoking status: Never   Smokeless tobacco: Never  Substance and Sexual Activity   Alcohol use: Yes    Comment: occ beer   Drug use: No   Sexual activity: Yes  Other Topics Concern   Not on file  Social History Narrative   Lives in [place] with his wife.   Social Determinants of Health   Financial Resource Strain: Not on file  Food Insecurity: Not on file  Transportation Needs: Not on file  Physical Activity: Not on file  Stress: Not on file  Social Connections: Not on file  Intimate Partner Violence: Not on file   Health Maintenance  Topic Date Due   COVID-19 Vaccine (1) Never done   Zoster Vaccines- Shingrix (1 of 2) Never done   COLONOSCOPY (Pts 45-4yrs Insurance coverage will need to be confirmed)  10/29/2027   TETANUS/TDAP  11/27/2028   INFLUENZA VACCINE  Completed   Hepatitis C Screening  Completed   HIV Screening   Completed   Pneumococcal Vaccine 59-4 Years old  Aged Out   HPV VACCINES  Aged Out    The following portions of the patient's history were reviewed and updated as appropriate: allergies, current medications, past family history, past medical history, past social history, past surgical history, and problem list.  Review of Systems Pertinent items noted in HPI and remainder of comprehensive ROS otherwise negative.   Objective:    BP (!) 132/98 (BP Location: Right Arm, Patient Position: Sitting, Cuff Size: Normal)    Pulse 92    Temp 97.8 F (36.6 C) (Oral)    Ht 6\' 1"  (1.854 m)    Wt 193 lb 9.6 oz (87.8 kg)    SpO2 97%    BMI 25.54 kg/m  General appearance: alert, cooperative, and no distress Head: Normocephalic, without obvious abnormality, atraumatic Eyes: conjunctivae/corneas clear. PERRL, EOM's intact. Fundi benign. Ears: normal TM's and external ear canals both ears Nose: Nares normal. Septum midline. Mucosa normal. No drainage or sinus tenderness. Throat: lips, mucosa, and tongue normal; teeth and gums normal Neck: no adenopathy, no carotid bruit, no JVD, supple, symmetrical, trachea midline, and thyroid not enlarged, symmetric, no tenderness/mass/nodules Lungs: clear to auscultation bilaterally Heart: regular rate and rhythm, S1, S2 normal, no murmur, click, rub or gallop Abdomen:  BS present, soft, nondistended, mild TTP in LLQ.  No hepatosplenomegaly.  No hernia noted.  Extremities: extremities normal, atraumatic, no cyanosis or edema Pulses: 2+ and symmetric Skin: Skin color, texture, turgor normal. No rashes or lesions dry rough, scaly patch on left forehead near hairline.  Hyperpigmented areas on bilateral temples/scalp.  Whitish slightly raised dry, rough lesions on right foot Lymph nodes: Cervical, supraclavicular, and axillary nodes normal. Neurologic: Alert and oriented X 3, normal strength and tone. Normal symmetric reflexes. Normal coordination and gait    Assessment:     Healthy male exam.      Plan:    Anticipatory guidance given including wearing seatbelts, smoke detectors in the home, increasing physical activity, increasing p.o. intake of water and vegetables. -Obtain labs -Colonoscopy up-to-date done 10/28/2017 -Immunizations reviewed -Given handout -Neck CPE in 1 year See After Visit Summary for Counseling Recommendations   Essential hypertension -Elevated -Possibly 2/2 dehydration and increased sodium intake -Lifestyle modification strongly encouraged -In the past Coreg 3.25 mg twice daily caused bloating and myalgias, propranolol caused diarrhea -Continue lisinopril 10 mg. - Plan: Lipid panel, CMP  [place] -Noted on right foot and ankle -Discussed removal options -Given handout  Acute cough -Likely postviral 2/2 URI prior to symptoms starting -Also consider medication, lisinopril -We will obtain CXR. -For continued symptoms discussed switching lisinopril to a CCB - Plan: DG Chest 2 View  Actinic keratoses  LLQ pain  -Asymptomatic -noted on exam -Discussed possible causes including constipation, muscle strain, hernia, diverticulitis -Continue to monitor - Plan: CBC with Differential/Platelet, CMP  Screening for prostate cancer -Last PSA 0.510 on 02/25/2021 - Plan: PSA  Palpitations -History of rare PVCs and brief paroxysmal SVT -Possibly 2/2 dehydration -Continue current medications including diltiazem as needed -We will obtain labs -Past beta-blockers caused GI symptoms including bloating, diarrhea, and myalgias -Continue follow-up cardiology as needed - Plan: TSH, T4, Free, Lipid panel  Follow-up in 1 month for HTN/cough  Abbe Amsterdam, MD

## 2021-11-11 NOTE — Patient Instructions (Signed)
With continued cough he should consider switching from lisinopril to a different blood pressure medication.

## 2021-11-20 ENCOUNTER — Other Ambulatory Visit: Payer: Self-pay | Admitting: Family Medicine

## 2021-11-20 DIAGNOSIS — R350 Frequency of micturition: Secondary | ICD-10-CM

## 2021-11-20 DIAGNOSIS — N401 Enlarged prostate with lower urinary tract symptoms: Secondary | ICD-10-CM

## 2021-11-20 DIAGNOSIS — R300 Dysuria: Secondary | ICD-10-CM

## 2021-11-23 ENCOUNTER — Other Ambulatory Visit: Payer: Self-pay | Admitting: Family Medicine

## 2021-11-23 DIAGNOSIS — B079 Viral wart, unspecified: Secondary | ICD-10-CM

## 2021-11-23 DIAGNOSIS — E875 Hyperkalemia: Secondary | ICD-10-CM

## 2021-11-23 DIAGNOSIS — L570 Actinic keratosis: Secondary | ICD-10-CM

## 2022-02-13 ENCOUNTER — Other Ambulatory Visit: Payer: Self-pay | Admitting: Family Medicine

## 2022-02-13 DIAGNOSIS — N401 Enlarged prostate with lower urinary tract symptoms: Secondary | ICD-10-CM

## 2022-02-13 DIAGNOSIS — R300 Dysuria: Secondary | ICD-10-CM

## 2022-02-13 DIAGNOSIS — R350 Frequency of micturition: Secondary | ICD-10-CM

## 2022-03-24 ENCOUNTER — Other Ambulatory Visit: Payer: Self-pay

## 2022-03-24 ENCOUNTER — Encounter (HOSPITAL_BASED_OUTPATIENT_CLINIC_OR_DEPARTMENT_OTHER): Payer: Self-pay

## 2022-03-24 ENCOUNTER — Emergency Department (HOSPITAL_BASED_OUTPATIENT_CLINIC_OR_DEPARTMENT_OTHER): Payer: PRIVATE HEALTH INSURANCE | Admitting: Radiology

## 2022-03-24 ENCOUNTER — Emergency Department (HOSPITAL_BASED_OUTPATIENT_CLINIC_OR_DEPARTMENT_OTHER)
Admission: EM | Admit: 2022-03-24 | Discharge: 2022-03-24 | Disposition: A | Discharge status: Home / Self Care | Payer: PRIVATE HEALTH INSURANCE | Attending: Emergency Medicine | Admitting: Emergency Medicine

## 2022-03-24 DIAGNOSIS — R0602 Shortness of breath: Secondary | ICD-10-CM | POA: Insufficient documentation

## 2022-03-24 DIAGNOSIS — Z20822 Contact with and (suspected) exposure to covid-19: Secondary | ICD-10-CM | POA: Diagnosis not present

## 2022-03-24 DIAGNOSIS — Z79899 Other long term (current) drug therapy: Secondary | ICD-10-CM | POA: Diagnosis not present

## 2022-03-24 DIAGNOSIS — R079 Chest pain, unspecified: Secondary | ICD-10-CM | POA: Diagnosis not present

## 2022-03-24 DIAGNOSIS — R197 Diarrhea, unspecified: Secondary | ICD-10-CM | POA: Diagnosis present

## 2022-03-24 DIAGNOSIS — R000 Tachycardia, unspecified: Secondary | ICD-10-CM | POA: Diagnosis not present

## 2022-03-24 DIAGNOSIS — E860 Dehydration: Secondary | ICD-10-CM | POA: Diagnosis not present

## 2022-03-24 DIAGNOSIS — I10 Essential (primary) hypertension: Secondary | ICD-10-CM | POA: Diagnosis not present

## 2022-03-24 LAB — BASIC METABOLIC PANEL
Anion gap: 10 (ref 5–15)
BUN: 17 mg/dL (ref 8–23)
CO2: 22 mmol/L (ref 22–32)
Calcium: 9.9 mg/dL (ref 8.9–10.3)
Chloride: 105 mmol/L (ref 98–111)
Creatinine, Ser: 1.17 mg/dL (ref 0.61–1.24)
GFR, Estimated: >60 mL/min (ref >60)
Glucose, Bld: 139 mg/dL — ABNORMAL HIGH (ref 70–99)
Potassium: 4 mmol/L (ref 3.5–5.1)
Sodium: 137 mmol/L (ref 135–145)

## 2022-03-24 LAB — CBC
HCT: 45.5 % (ref 39.0–52.0)
Hemoglobin: 15.4 g/dL (ref 13.0–17.0)
MCH: 30.3 pg (ref 26.0–34.0)
MCHC: 33.8 g/dL (ref 30.0–36.0)
MCV: 89.6 fL (ref 80.0–100.0)
Platelets: 310 10*3/uL (ref 150–400)
RBC: 5.08 MIL/uL (ref 4.22–5.81)
RDW: 12.5 % (ref 11.5–15.5)
WBC: 7.5 10*3/uL (ref 4.0–10.5)
nRBC: 0 % (ref 0.0–0.2)

## 2022-03-24 LAB — D-DIMER, QUANTITATIVE: D-Dimer, Quant: 0.54 ug/mL-FEU — ABNORMAL HIGH (ref 0.00–0.50)

## 2022-03-24 LAB — RESP PANEL BY RT-PCR (FLU A&B, COVID) ARPGX2
Influenza A by PCR: NEGATIVE
Influenza B by PCR: NEGATIVE
SARS Coronavirus 2 by RT PCR: NEGATIVE

## 2022-03-24 LAB — TROPONIN I (HIGH SENSITIVITY)
Troponin I (High Sensitivity): 3 ng/L (ref <18)
Troponin I (High Sensitivity): 6 ng/L (ref <18)

## 2022-03-24 MED ORDER — SODIUM CHLORIDE 0.9 % IV BOLUS
1000.0000 mL | Freq: Once | INTRAVENOUS | Status: AC
  Administered 2022-03-24: 1000 mL via INTRAVENOUS

## 2022-03-24 NOTE — Telephone Encounter (Signed)
Covering Caitlin's inbox, noted. Pt being seen in ER. No further action needed in this msg presently. ?

## 2022-03-24 NOTE — Discharge Instructions (Signed)
Follow up with primary doctor for further work up if chest pain returns or new concerns. ?Tylenol every 4 hrs or motrin every 6 hrs for pain as needed. ?

## 2022-03-24 NOTE — ED Triage Notes (Signed)
He c/o some "sharp" central area chest pain, plus ~ 3 diarrhea stools per day since Saturday (5 days ago). He is in no distress. He c/o some shortness of breath while ambulating/any activity. ?

## 2022-03-24 NOTE — ED Provider Notes (Signed)
[facility] EMERGENCY DEPT Provider Note   CSN: [id number] Arrival date & time: 03/24/22  1100     History  Chief Complaint  Patient presents with   Chest Pain    Dustin [name] is a 61 y.o. male.  Patient presents with recurrent nonbloody diarrhea episodes and sharp chest pain since Saturday that is mostly resolved.  Minimal shortness of breath with ambulation.  No cardiac history, patient stress test approximately 4 years ago without he was told unremarkable and had imaging a couple years ago.  No blood clot history or risk factors.  Patient was hiking recently.  Patient has history of high blood pressure and acid reflux.  No injuries to his chest.  No significant discomfort with a deep breath.  Mild cough no fevers.      Home Medications Prior to Admission medications   Medication Sig Start Date End Date Taking? Authorizing Provider  acetaminophen (TYLENOL) 500 MG tablet Take 1,000 mg by mouth every 6 (six) hours as needed for headache (pain).    [provider]  diltiazem (CARDIZEM) 30 MG tablet Take 1 tablet (30 mg total) by mouth 2 (two) times daily as needed (As needed for palpitations.). 08/20/21   Alver Sorrow, NP  fluticasone (FLONASE) 50 MCG/ACT nasal spray SPRAY 1 SPRAY INTO BOTH NOSTRILS DAILY. 06/16/21   [name], MD  lisinopril (ZESTRIL) 10 MG tablet TAKE 1 TABLET BY MOUTH EVERY DAY 06/29/21   Marjie Skiff E, PA-C  Multiple Vitamin (MULTIVITAMIN WITH MINERALS) TABS tablet Take 1 tablet by mouth daily.    [provider]  pantoprazole (PROTONIX) 20 MG tablet TAKE 1 TABLET BY MOUTH EVERY DAY 08/10/21   [name], MD  tamsulosin (FLOMAX) 0.4 MG CAPS capsule TAKE 1 CAPSULE BY MOUTH EVERY DAY 02/15/22   [name], MD      Allergies    Novocain [procaine]    Review of Systems   Review of Systems  Constitutional:  Negative for chills and fever.  HENT:  Negative for congestion.   Eyes:  Negative for visual  disturbance.  Respiratory:  Positive for cough and shortness of breath.   Cardiovascular:  Positive for chest pain. Negative for leg swelling.  Gastrointestinal:  Negative for abdominal pain and vomiting.  Genitourinary:  Negative for dysuria and flank pain.  Musculoskeletal:  Negative for back pain, neck pain and neck stiffness.  Skin:  Negative for rash.  Neurological:  Negative for light-headedness and headaches.   Physical Exam Updated Vital Signs BP (!) 152/84 (BP Location: Right Arm)   Pulse (!) 122   Temp 98.4 F (36.9 C)   Resp 16   SpO2 97%  Physical Exam Vitals and nursing note reviewed.  Constitutional:      General: He is not in acute distress.    Appearance: He is well-developed.  HENT:     Head: Normocephalic and atraumatic.     Comments: Dry mm    Mouth/Throat:     Mouth: Mucous membranes are moist.  Eyes:     General:        Right eye: No discharge.        Left eye: No discharge.     Conjunctiva/sclera: Conjunctivae normal.  Neck:     Trachea: No tracheal deviation.  Cardiovascular:     Rate and Rhythm: Regular rhythm. Tachycardia present.     Heart sounds: No murmur heard. Pulmonary:     Effort: Pulmonary effort is normal.  Breath sounds: Normal breath sounds.  Abdominal:     General: There is no distension.     Palpations: Abdomen is soft.     Tenderness: There is no abdominal tenderness. There is no guarding.  Musculoskeletal:     Cervical back: Normal range of motion and neck supple. No rigidity.  Skin:    General: Skin is warm.     Capillary Refill: Capillary refill takes less than 2 seconds.     Findings: No rash.  Neurological:     General: No focal deficit present.     Mental Status: He is alert.     Cranial Nerves: No cranial nerve deficit.  Psychiatric:        Mood and Affect: Mood normal.    ED Results / Procedures / Treatments   Labs (all labs ordered are listed, but only abnormal results are displayed) Labs Reviewed  RESP  PANEL BY RT-PCR (FLU A&B, COVID) ARPGX2  CBC  BASIC METABOLIC PANEL  D-DIMER, QUANTITATIVE  TROPONIN I (HIGH SENSITIVITY)    EKG EKG Interpretation  Date/Time:  Wednesday Mar 24 2022 11:08:38 EDT Ventricular Rate:  122 PR Interval:  134 QRS Duration: 72 QT Interval:  286 QTC Calculation: 407 R Axis:   55 Text Interpretation: Sinus tachycardia with occasional Premature ventricular complexes Otherwise normal ECG When compared with ECG of 10-Feb-2021 13:30, PREVIOUS ECG IS PRESENT Confirmed by Blane Ohara [phone]) on 03/24/2022 11:20:08 AM  Radiology No results found.  Procedures Procedures    Medications Ordered in ED Medications  sodium chloride 0.9 % bolus 1,000 mL (has no administration in time range)    ED Course/ Medical Decision Making/ A&P                           Medical Decision Making Amount and/or Complexity of Data Reviewed Labs: ordered. Radiology: ordered.   Patient presents with sharp chest pain nonradiating differential includes musculoskeletal especially with recent cough, reflux, pleuritis with recent cough/viral symptoms, pulmonary embolism, ACS, other.  No concern for dissection at this time.  Patient low risk for blood clots D-dimer sent.  Patient low risk for ACS troponin sent.  Patient has mild symptoms currently tachycardia likely secondary to dehydration from recurrent diarrhea.  Patient's heart rate improved in the ER with fluids.  Age-adjusted D-dimer negative no further indication of CT scan for blood clot work-up as patient is low risk PE.  General blood work reviewed independently showing normal white count, electrolytes unremarkable, normal hemoglobin no signs significant anemia.  Troponin negative, patient low risk ACS.  Patient stable for outpatient follow-up.         Final Clinical Impression(s) / ED Diagnoses Final diagnoses:  Dehydration  Diarrhea, unspecified type  Acute chest pain    Rx / DC Orders ED Discharge Orders      None         Blane Ohara, MD 03/27/22 [phone]

## 2022-03-24 NOTE — ED Notes (Signed)
Discharge instructions and follow up care reviewed and explained. Pt verbalized understanding. Pt caox4 and ambulatory on departure denying CP/SOB.  ?

## 2022-03-29 ENCOUNTER — Other Ambulatory Visit: Payer: Self-pay | Admitting: Family Medicine

## 2022-03-29 DIAGNOSIS — K219 Gastro-esophageal reflux disease without esophagitis: Secondary | ICD-10-CM

## 2022-03-31 ENCOUNTER — Ambulatory Visit (INDEPENDENT_AMBULATORY_CARE_PROVIDER_SITE_OTHER): Payer: PRIVATE HEALTH INSURANCE | Admitting: Gastroenterology

## 2022-03-31 ENCOUNTER — Encounter: Payer: Self-pay | Admitting: Gastroenterology

## 2022-03-31 ENCOUNTER — Other Ambulatory Visit: Payer: PRIVATE HEALTH INSURANCE

## 2022-03-31 VITALS — BP 126/68 | HR 88 | Ht 73.0 in | Wt 196.0 lb

## 2022-03-31 DIAGNOSIS — K219 Gastro-esophageal reflux disease without esophagitis: Secondary | ICD-10-CM

## 2022-03-31 DIAGNOSIS — R110 Nausea: Secondary | ICD-10-CM

## 2022-03-31 DIAGNOSIS — R197 Diarrhea, unspecified: Secondary | ICD-10-CM

## 2022-03-31 MED ORDER — DICYCLOMINE HCL 10 MG PO CAPS
10.0000 mg | ORAL_CAPSULE | Freq: Three times a day (TID) | ORAL | 1 refills | Status: DC | PRN
Start: 2022-03-31 — End: 2022-08-23

## 2022-03-31 MED ORDER — FAMOTIDINE 20 MG PO TABS: 20.0000 mg | ORAL_TABLET | Freq: Every day | ORAL | 1 refills | Status: DC

## 2022-03-31 MED ORDER — ONDANSETRON 4 MG PO TBDP: 4.0000 mg | ORAL_TABLET | Freq: Three times a day (TID) | ORAL | 1 refills | Status: DC | PRN

## 2022-03-31 NOTE — Progress Notes (Signed)
HPI :  61 year old male with a history of hypertension, cholecystectomy, umbilical hernia repair, here to reestablish care for acute bowel changes.  He was last seen here in December 2018 for screening colonoscopy which was normal other than mild diverticulosis and internal hemorrhoids.  He states about 2 weeks ago he developed severe diarrhea.  At baseline since his cholecystectomy he has occasional loose stools but his bowels are generally fairly regular.  He had significant diarrhea that persisted for a few days and slowly got a bit better, then he had recurrence of symptoms and associated with some vomiting and heartburn.  He had an episode of chest pain that led him to the ED on May 17.  They found no evidence of cardiac event based on EKG, troponins, ED work-up.  He states his chest pain episode lasted about 20 hours then resolved.  In recent days he has had anywhere between 10 and 12 bowel movements per day which are loose and brown in color.  No blood in his stool.  He denies any nocturnal symptoms.  He has not really taken any Imodium, has tried a few doses of Pepto-Bismol.  He has been on a magnesium supplement as of the past month or so.  He denies any recent antibiotic use.  No sick contacts.  He does eat raw salmon from time to time, the last was about 2 months ago.  He continues to have some periodic nausea, and some reflux symptoms that have been bothering him.  He has taken some Tums.  He has had some abdominal cramping with the diarrhea he has been having.   Colonoscopy 10/28/2017 - The perianal and digital rectal examinations were normal. - A single medium-mouthed diverticulum was found in the transverse colon. - Internal hemorrhoids were found during retroflexion. The hemorrhoids were small. - The exam was otherwise without abnormality.  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     Past Surgical History:  Procedure Laterality Date   CHOLECYSTECTOMY  123456   UMBILICAL HERNIA REPAIR  2003   WISDOM TOOTH EXTRACTION     Family History  Problem Relation Age of Onset   Hypertension Mother    Cancer Sister        melanoma   CAD Father        CABG at age 38-60   Heart disease Sister        heart attack 11/2010 - stents placed at age 22-60   Colon cancer Neg Hx    Colon polyps Neg Hx    Rectal cancer Neg Hx    Stomach cancer Neg Hx    Social History   Tobacco Use   Smoking status: Never   Smokeless tobacco: Never  Substance Use Topics   Alcohol use: Yes    Comment: occ beer   Drug use: No   Current Outpatient Medications  Medication Sig Dispense Refill   acetaminophen (TYLENOL) 500 MG tablet Take 1,000 mg by mouth every 6 (six) hours as needed for headache (pain).     aspirin EC 81 MG tablet Take 81 mg by mouth daily. Swallow whole.     co-enzyme Q-10 30 MG capsule Take 30 mg by mouth daily.     fluticasone (FLONASE) 50 MCG/ACT nasal spray SPRAY 1 SPRAY INTO BOTH NOSTRILS DAILY. 48 mL  1   lisinopril (ZESTRIL) 10 MG tablet TAKE 1 TABLET BY MOUTH EVERY DAY 90 tablet 3   magnesium 30 MG tablet Take 30 mg by mouth daily.     Multiple Vitamin (MULTIVITAMIN WITH MINERALS) TABS tablet Take 1 tablet by mouth daily.     Multiple Vitamins-Minerals (VITAMIN D3 COMPLETE PO) Take 1 tablet by mouth daily.     tamsulosin (FLOMAX) 0.4 MG CAPS capsule TAKE 1 CAPSULE BY MOUTH EVERY DAY 90 capsule 0   zinc gluconate 50 MG tablet Take 50 mg by mouth daily.     No current facility-administered medications for this visit.   Allergies  Allergen Reactions   Novocain [Procaine] Swelling     Review of Systems: All systems reviewed and negative except where noted in HPI.    DG Chest 2 View  Result Date: 03/24/2022 CLINICAL DATA:  Medial chest pain EXAM: CHEST - 2 VIEW COMPARISON:  Chest radiograph 11/11/2021  FINDINGS: Cardiomediastinal silhouette is normal. There is no focal consolidation or pulmonary edema. There is no pleural effusion or pneumothorax There is no acute osseous abnormality. Right upper quadrant surgical clips are noted. IMPRESSION: Stable chest with no radiographic evidence of acute cardiopulmonary process. Electronically Signed   By: Valetta Mole M.D.   On: 03/24/2022 12:00    Lab Results  Component Value Date   WBC 7.5 03/24/2022   HGB 15.4 03/24/2022   HCT 45.5 03/24/2022   MCV 89.6 03/24/2022   PLT 310 03/24/2022    Lab Results  Component Value Date   CREATININE 1.17 03/24/2022   BUN 17 03/24/2022   NA 137 03/24/2022   K 4.0 03/24/2022   CL 105 03/24/2022   CO2 22 03/24/2022    Lab Results  Component Value Date   ALT 28 11/11/2021   AST 28 11/11/2021   ALKPHOS 41 11/11/2021   BILITOT 0.8 11/11/2021     Physical Exam: BP 126/68   Pulse 88   Ht 6\' 1"  (1.854 m)   Wt 196 lb (88.9 kg)   BMI 25.86 kg/m  Constitutional: Pleasant,well-developed, male in no acute distress. HEENT: Normocephalic and atraumatic. Conjunctivae are normal. No scleral icterus. Neck supple.  Cardiovascular: Normal rate, regular rhythm.  Pulmonary/chest: Effort normal and breath sounds normal.  Abdominal: Soft, nondistended, nontender. There are no masses palpable.  Extremities: no edema Lymphadenopathy: No cervical adenopathy noted. Neurological: Alert and oriented to person place and time. Skin: Skin is warm and dry. No rashes noted. Psychiatric: Normal mood and affect. Behavior is normal.   ASSESSMENT AND PLAN: 61 year old male here for new patient assessment following:  Acute diarrhea - suspicious for infectious origin GERD Nausea  Patient in his usual state of health until acute diarrheal illness a few weeks ago, has had persistent loose stools for the most part since then, also some nausea and reflux symptoms.  Had episode of chest pain that led to an ED visit, ruled out  for MI.  Discussed differential diagnosis with him.  Infectious is most likely.  We will send to the lab for GI pathogen panel to see if we can identify what is causing this.  If Pepto-Bismol has helped and he can continue taking that, or try Imodium, he is not having any fevers or bloody stools etc.  We will give him some Bentyl to use as needed for abdominal cramps, Zofran to use as needed for nausea, Pepcid to take daily as needed for mild reflux symptoms.  Hopefully this improves with time and  conservative measures.  I will contact him with results of his GI pathogen panel with further recommendations based on his course.  Plan: - lab for GI pathogen panel - start Pepcid 20mg  daily PRN - start Bentyl 10mg  every 8 hours PRN cramps - start Zofren 4mg  ODT every 6 hours PRN nausea  - use Peptobmisol PRN or immodium PRN for diarrhea  Further recommendations pending the results and his course.Jolly Mango, MD [facility]

## 2022-03-31 NOTE — Patient Instructions (Addendum)
If you are age 63 or older, your body mass index should be between 23-30. Your Body mass index is 25.86 kg/m. If this is out of the aforementioned range listed, please consider follow up with your Primary Care Provider.  If you are age 71 or younger, your body mass index should be between 19-25. Your Body mass index is 25.86 kg/m. If this is out of the aformentioned range listed, please consider follow up with your Primary Care Provider.   ________________________________________________________  The [facility] providers would like to encourage you to use [facility] to communicate with providers for non-urgent requests or questions.  Due to long hold times on the telephone, sending your provider a message by [facility] may be a faster and more efficient way to get a response.  Please allow 48 business hours for a response.  Please remember that this is for non-urgent requests.  _______________________________________________________  Please go to the lab in the basement of our building to have lab work done as you leave today. Hit "B" for basement when you get on the elevator.  When the doors open the lab is on your left.  We will call you with the results. Thank you.   We have sent the following medications to your pharmacy for you to pick up at your convenience: Zofran ODT: Dissolve 1 tablet on your tongue every 8 hours as needed for nausea Bentyl 10 mg: Take every 8 hours as needed  Pepcid 20 mg: Take once daily  You can use Pepto as needed You can use Imodium as needed.  Thank you for entrusting me with your care and for choosing [facility], Dr. Ileene Patrick

## 2022-04-02 ENCOUNTER — Telehealth: Payer: Self-pay | Admitting: Gastroenterology

## 2022-04-02 ENCOUNTER — Other Ambulatory Visit: Payer: Self-pay

## 2022-04-02 LAB — GI PROFILE, STOOL, PCR
Adenovirus F 40/41: NOT DETECTED
Astrovirus: NOT DETECTED
C difficile toxin A/B: NOT DETECTED
Campylobacter: NOT DETECTED
Cryptosporidium: NOT DETECTED
Cyclospora cayetanensis: NOT DETECTED
Entamoeba histolytica: NOT DETECTED
Enteroaggregative E coli: NOT DETECTED
Enteropathogenic E coli: NOT DETECTED
Enterotoxigenic E coli: NOT DETECTED
Giardia lamblia: DETECTED — AB
Norovirus GI/GII: NOT DETECTED
Plesiomonas shigelloides: NOT DETECTED
Rotavirus A: NOT DETECTED
Salmonella: NOT DETECTED
Sapovirus: NOT DETECTED
Shiga-toxin-producing E coli: NOT DETECTED
Shigella/Enteroinvasive E coli: NOT DETECTED
Vibrio cholerae: NOT DETECTED
Vibrio: NOT DETECTED
Yersinia enterocolitica: NOT DETECTED

## 2022-04-02 MED ORDER — TINIDAZOLE 500 MG PO TABS: 2.0000 g | ORAL_TABLET | Freq: Once | ORAL | 0 refills | Status: AC

## 2022-04-02 NOTE — Telephone Encounter (Signed)
Inbound call from patient stating he got his results back about having an infection and is seeking advice if there is medication he can take for it. Please advise.

## 2022-04-06 NOTE — Telephone Encounter (Signed)
Patient was already contacted by covering RN. See 03/31/22 stool study results for details.

## 2022-04-22 ENCOUNTER — Other Ambulatory Visit: Payer: Self-pay | Admitting: Gastroenterology

## 2022-05-14 ENCOUNTER — Other Ambulatory Visit: Payer: Self-pay | Admitting: Family Medicine

## 2022-05-14 DIAGNOSIS — N401 Enlarged prostate with lower urinary tract symptoms: Secondary | ICD-10-CM

## 2022-05-14 DIAGNOSIS — R300 Dysuria: Secondary | ICD-10-CM

## 2022-05-14 DIAGNOSIS — R350 Frequency of micturition: Secondary | ICD-10-CM

## 2022-05-17 ENCOUNTER — Other Ambulatory Visit: Payer: Self-pay | Admitting: Gastroenterology

## 2022-06-25 ENCOUNTER — Other Ambulatory Visit: Payer: Self-pay | Admitting: Student

## 2022-06-25 DIAGNOSIS — I10 Essential (primary) hypertension: Secondary | ICD-10-CM

## 2022-07-20 ENCOUNTER — Other Ambulatory Visit: Payer: Self-pay | Admitting: Cardiology

## 2022-07-20 DIAGNOSIS — I10 Essential (primary) hypertension: Secondary | ICD-10-CM

## 2022-07-20 NOTE — Telephone Encounter (Signed)
Rx(s) sent to pharmacy electronically.  

## 2022-08-12 ENCOUNTER — Other Ambulatory Visit: Payer: Self-pay | Admitting: Cardiology

## 2022-08-12 DIAGNOSIS — I10 Essential (primary) hypertension: Secondary | ICD-10-CM

## 2022-08-12 NOTE — Telephone Encounter (Signed)
Rx(s) sent to pharmacy electronically.  

## 2022-08-17 ENCOUNTER — Other Ambulatory Visit: Payer: Self-pay | Admitting: Family Medicine

## 2022-08-17 DIAGNOSIS — R300 Dysuria: Secondary | ICD-10-CM

## 2022-08-17 DIAGNOSIS — N401 Enlarged prostate with lower urinary tract symptoms: Secondary | ICD-10-CM

## 2022-08-17 DIAGNOSIS — R350 Frequency of micturition: Secondary | ICD-10-CM

## 2022-08-23 ENCOUNTER — Ambulatory Visit (INDEPENDENT_AMBULATORY_CARE_PROVIDER_SITE_OTHER): Payer: PRIVATE HEALTH INSURANCE | Admitting: Cardiology

## 2022-08-23 VITALS — BP 138/82 | HR 83 | Ht 73.0 in | Wt 204.0 lb

## 2022-08-23 DIAGNOSIS — G8929 Other chronic pain: Secondary | ICD-10-CM

## 2022-08-23 DIAGNOSIS — I493 Ventricular premature depolarization: Secondary | ICD-10-CM | POA: Diagnosis not present

## 2022-08-23 DIAGNOSIS — I10 Essential (primary) hypertension: Secondary | ICD-10-CM

## 2022-08-23 DIAGNOSIS — I471 Supraventricular tachycardia, unspecified: Secondary | ICD-10-CM | POA: Diagnosis not present

## 2022-08-23 DIAGNOSIS — R002 Palpitations: Secondary | ICD-10-CM

## 2022-08-23 DIAGNOSIS — R079 Chest pain, unspecified: Secondary | ICD-10-CM

## 2022-08-23 NOTE — Progress Notes (Signed)
Cardiology Office Note:    Date:  08/23/2022   ID:  Dustin [name], DOB [birth year], MRN [id number]  PCP:  Dustin Ruddy, MD  Cardiologist:  Dustin Dresser, MD PhD  Referring MD: Dustin Ruddy, MD   CC: follow up  History of Present Illness:    Dustin [name] is a 61 y.o. male with a hx of HTN, strong FH of CAD who is seen for post hospital follow up. I met him in the hospital on 04/19/19 during an [place] for chest pain.  Cardiac history: longstanding chest pain, normal myoview 2017. Ct coronary 04/2019 during admission for chest pain with calcium score of 0, normal cors, no evidence of CAD. PA measured 37 mm. CT angio chest/dissection done the day prior was unremarkable.  Today: He says he has overall been good.  He recalls that one day he experienced sharp central chest pain for one hour. He has not had any chest pain since then, and he also has not experienced any more chest pressure.   Additionally, he states that since spring he has been experiencing some bilateral jaw pain. He notices this the most during seasonal changes. He also notes some shoulder pain.  He has also noticed some mild bilateral LE edema.  His at home blood pressures have been around 135/65. His initial blood pressure reading in clinic today was 142/88. Upon recheck, his blood pressure was 138/82.   For his diet, he is still eating a lot of [employer] for his meals. He also orders from [employer], a similar company to [employer] but cheaper and more environmentally friendly. He has started drinking a scoop of beet powder in water per day.  He started back at the gym two weeks ago for three nights in a row. He gets a fair bit of physical activity with his job, climbing ladders or going up a lot of stairs, as well as house or yard work on the weekends.   He reports that he has gained a little weight. He has been working on losing weight and becoming more active again.    He does not drink any  alcohol.   He denies any palpitations or shortness of breath. No lightheadedness, headaches, syncope, orthopnea, or PND.  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   aspirin EC 81 MG tablet Take 81 mg by mouth daily. Swallow whole.   co-enzyme Q-10 30 MG capsule Take 30 mg by mouth daily.   dicyclomine (BENTYL) 10 MG capsule Take 1 capsule (10 mg total) by mouth every 8 (eight) hours as needed for spasms.   famotidine (PEPCID) 20 MG tablet Take 1 tablet (20 mg total) by mouth daily as needed.   fluticasone (FLONASE) 50 MCG/ACT nasal spray SPRAY 1 SPRAY INTO BOTH NOSTRILS DAILY.   lisinopril (ZESTRIL) 10 MG tablet TAKE 1 TABLET (10 MG TOTAL) BY MOUTH DAILY. SCHEDULE OFFICE VISIT FOR FUTURE REFILLS.   magnesium 30 MG tablet Take 30 mg by mouth  daily.   Multiple Vitamin (MULTIVITAMIN WITH MINERALS) TABS tablet Take 1 tablet by mouth daily.   Multiple Vitamins-Minerals (VITAMIN D3 COMPLETE PO) Take 1 tablet by mouth daily.   ondansetron (ZOFRAN-ODT) 4 MG disintegrating tablet Take 1 tablet (4 mg total) by mouth every 8 (eight) hours as needed for nausea or vomiting.   tamsulosin (FLOMAX) 0.4 MG CAPS capsule TAKE 1 CAPSULE BY MOUTH EVERY DAY   zinc gluconate 50 MG tablet Take 50 mg by mouth daily.   No current facility-administered medications on file prior to visit.     Allergies:   Novocain [procaine]   Social History   Tobacco Use   Smoking status: Never   Smokeless tobacco: Never  Substance Use Topics   Alcohol use:  Yes    Comment: occ beer   Drug use: No    Family History: The patient's family history includes CAD in his father; Cancer in his sister; Heart disease in his sister; Hypertension in his mother. There is no history of Colon cancer, Colon polyps, Rectal cancer, or Stomach cancer.  ROS:   Please see the history of present illness.   (+) Bilateral jaw pain (+) Shoulder pain (+) Mild bilateral LE edema  Additional pertinent ROS otherwise unremarkable.  EKGs/Labs/Other Studies Reviewed:    The following studies were reviewed today:  Echo 05/15/2021:  1. Left ventricular ejection fraction, by estimation, is 60 to 65%. Left  ventricular ejection fraction by 3D volume is 64 %. The left ventricle has  normal function. The left ventricle has no regional wall motion  abnormalities. Left ventricular diastolic   parameters are consistent with Grade I diastolic dysfunction (impaired  relaxation).   2. Right ventricular systolic function is normal. The right ventricular  size is normal.   3. The mitral valve is normal in structure. No evidence of mitral valve  regurgitation. No evidence of mitral stenosis.   4. The aortic valve is normal in structure. Aortic valve regurgitation is  not visualized. No aortic stenosis is present.   5. The inferior vena cava is normal in size with greater than 50%  respiratory variability, suggesting right atrial pressure of 3 mmHg.   Monitor 09/2019 3 days of data recorded on Zio monitor. Patient had a min HR of 53 bpm, max HR of 182 bpm, and avg HR of 96 bpm. Predominant underlying rhythm was Sinus Rhythm. No VT, atrial fibrillation, high degree block, or pauses noted. Isolated atrial ectopy was rare (<1%). PVCs were frequent (6.4%), with rare couplets/triplets and rare bigeminy/trigeminy. There were 35 triggered events, which were sinus rhythm with ectopy. 6 brief Supraventricular Tachycardia runs occurred, the run with the fastest interval lasting 6 beats with a  max rate of 182 bpm, the longest lasting 9 beats with an avg rate of 137 bpm.  CT coronary 04/20/19 -Ca score 0, no evidence of CAD  Exercise nuclear stress 08/26/2016 Nuclear stress EF: 61%. Normal LV function There was no ST segment deviation noted during stress. The study is normal. No ischemia . No infarction This is a low risk study.  EKG:  EKG is personally reviewed.   08/23/22: Sinus rhythm at 83 bpm with PACs 07/20/2021: not ordered 10/06/2020: sinus rhythm with sinus arrhythmia at 74 bpm  Recent Labs: 11/11/2021: ALT 28; TSH 1.40 03/24/2022: BUN 17; Creatinine, Ser 1.17; Hemoglobin 15.4; Platelets 310; Potassium 4.0; Sodium 137  Recent Lipid Panel    Component Value Date/Time   CHOL 201 (H) 11/11/2021 0900   TRIG 177.0 (  H) 11/11/2021 0900   HDL 54.80 11/11/2021 0900   CHOLHDL 4 11/11/2021 0900   VLDL 35.4 11/11/2021 0900   LDLCALC 110 (H) 11/11/2021 0900    Physical Exam:    VS:  BP 138/82 (BP Location: Right Arm, Patient Position: Sitting, Cuff Size: Normal)   Pulse 83   Ht _0  (1.854 m)   Wt 204 lb (92.5 kg)   BMI 26.91 kg/m     Wt Readings from Last 3 Encounters:  03/31/22 196 lb (88.9 kg)  03/24/22 191 lb (86.6 kg)  11/11/21 193 lb 9.6 oz (87.8 kg)    GEN: Well nourished, well developed in no acute distress HEENT: Normal, moist mucous membranes NECK: No JVD CARDIAC: regular rhythm, normal S1 and S2, no rubs or gallops. No murmur. VASCULAR: Radial and DP pulses 2+ bilaterally. No carotid bruits RESPIRATORY:  Clear to auscultation without rales, wheezing or rhonchi  ABDOMEN: Soft, non-tender, non-distended MUSCULOSKELETAL:  Ambulates independently SKIN: Warm and dry, trivial bilateral LE edema NEUROLOGIC:  Alert and oriented x 3. No focal neuro deficits noted. PSYCHIATRIC:  Normal affect    ASSESSMENT:    1. Palpitations   2. Essential hypertension   3. Paroxysmal SVT (supraventricular tachycardia)   4. PVC (premature ventricular contraction)   5.  Chronic chest pain     PLAN:    Palpitations:  -monitor shows show rare/brief paroxysmal SVT and 6% PVC burden -tried beta blockers, concern that bloating and diarrhea may be due to this.   Hypertension: has been well controlled at home. Had prior episodes of hypotension, monitor -continue lisinopril 10 mg daily -held beta blocker as above  History of Chest pain:  CT coronary without plaque or calcium -we previously discussed guidelines and recommendations for statin based on calcium score of 0.  -suspect noncardiac etiology of his pain, but we have reviewed red flags that need immediate medical attention  Cardiac risk counseling and prevention recommendations: -recommend heart healthy/Mediterranean diet, with whole grains, fruits, vegetable, fish, lean meats, nuts, and olive oil. Limit salt. -recommend moderate walking, 3-5 times/week for 30-50 minutes each session. Aim for at least 150 minutes.week. Goal should be pace of 3 miles/hours, or walking 1.5 miles in 30 minutes -recommend avoidance of tobacco products. Avoid excess alcohol.  Plan for follow up: 1 year.  Dustin Dresser, MD, PhD [place]  [facility]   Medication Adjustments/Labs and Tests Ordered: Current medicines are reviewed at length with the patient today.  Concerns regarding medicines are outlined above.  Orders Placed This Encounter  Procedures   EKG 12-Lead   No orders of the defined types were placed in this encounter.  Patient Instructions  Medication Instructions:  Your Physician recommend you continue on your current medication as directed.    *If you need a refill on your cardiac medications before your next appointment, please call your pharmacy*  Follow-Up: At [facility], you and your health needs are our priority.  As part of our continuing mission to provide you with exceptional heart care, we have created designated Provider Care Teams.  These Care Teams include your  primary Cardiologist (physician) and Advanced Practice Providers (APPs -  Physician Assistants and Nurse Practitioners) who all work together to provide you with the care you need, when you need it.  We recommend signing up for the patient portal called "MyChart".  Sign up information is provided on this After Visit Summary.  MyChart is used to connect with patients for Virtual Visits (Telemedicine).  Patients are able to view lab/test results, encounter notes, upcoming appointments, etc.  Non-urgent messages can be sent to your provider as well.   To learn more about what you can do with MyChart, go to [email or web address].    Your next appointment:   1 year(s)  The format for your next appointment:   In Person  Provider:   Buford Dresser, MD           I,Breanna Adamick,acting as a scribe for Dustin Dresser, MD.,have documented all relevant documentation on the behalf of Dustin Dresser, MD,as directed by  Dustin Dresser, MD while in the presence of Dustin Dresser, MD.   I, Dustin Dresser, MD, have reviewed all documentation for this visit. The documentation on 08/23/22 for the exam, diagnosis, procedures, and orders are all accurate and complete.   Signed, Dustin Dresser, MD PhD 08/23/2022     [place]

## 2022-08-23 NOTE — Patient Instructions (Signed)
Medication Instructions:  Your Physician recommend you continue on your current medication as directed.    *If you need a refill on your cardiac medications before your next appointment, please call your pharmacy*  Follow-Up: At [facility], you and your health needs are our priority.  As part of our continuing mission to provide you with exceptional heart care, we have created designated Provider Care Teams.  These Care Teams include your primary Cardiologist (physician) and Advanced Practice Providers (APPs -  Physician Assistants and Nurse Practitioners) who all work together to provide you with the care you need, when you need it.  We recommend signing up for the patient portal called "MyChart".  Sign up information is provided on this After Visit Summary.  MyChart is used to connect with patients for Virtual Visits (Telemedicine).  Patients are able to view lab/test results, encounter notes, upcoming appointments, etc.  Non-urgent messages can be sent to your provider as well.   To learn more about what you can do with MyChart, go to [email or web address].    Your next appointment:   1 year(s)  The format for your next appointment:   In Person  Provider:   Bridgette Christopher, MD  

## 2022-09-10 ENCOUNTER — Other Ambulatory Visit: Payer: Self-pay | Admitting: Cardiology

## 2022-09-10 DIAGNOSIS — I10 Essential (primary) hypertension: Secondary | ICD-10-CM

## 2022-09-22 ENCOUNTER — Other Ambulatory Visit: Payer: Self-pay | Admitting: Family Medicine

## 2022-09-22 DIAGNOSIS — K219 Gastro-esophageal reflux disease without esophagitis: Secondary | ICD-10-CM

## 2022-11-09 ENCOUNTER — Telehealth: Payer: Self-pay

## 2022-11-09 NOTE — Telephone Encounter (Signed)
Caller has severe abd pain (ring of pain around lower back, each flank, and at belt line/waistline). He states that pain begain while doing home project on SUN PM, and worsened after he woke up MON AM. He has already spoken to office which has no appts available today. --Caller states that he was doing some house work and back pain started Sunday night. Caller states that pain radiates to the front. 5/10 with medication.   11/09/2022 1:08:24 PM SEE PCP WITHIN 3 DAYS Eugenio Hoes, RN, Jenny Reichmann  Comments User: Baird Cancer, RN Date/Time Eilene Ghazi Time): 11/09/2022 1:00:51 PM Hx HTN  User: Baird Cancer, RN Date/Time Eilene Ghazi Time): 11/09/2022 1:08:56 PM Patient connected to office line for appointment  Referrals REFERRED TO PCP OFFICE  Pt has appt with PCP on 11/11/22

## 2022-11-11 ENCOUNTER — Ambulatory Visit: Payer: PRIVATE HEALTH INSURANCE | Admitting: Family Medicine

## 2022-11-11 ENCOUNTER — Other Ambulatory Visit (HOSPITAL_BASED_OUTPATIENT_CLINIC_OR_DEPARTMENT_OTHER): Payer: Self-pay

## 2022-11-11 ENCOUNTER — Ambulatory Visit (INDEPENDENT_AMBULATORY_CARE_PROVIDER_SITE_OTHER): Payer: PRIVATE HEALTH INSURANCE

## 2022-11-11 VITALS — BP 120/86 | HR 99 | Temp 98.1°F | Wt 197.6 lb

## 2022-11-11 DIAGNOSIS — M545 Low back pain, unspecified: Secondary | ICD-10-CM

## 2022-11-11 DIAGNOSIS — R195 Other fecal abnormalities: Secondary | ICD-10-CM | POA: Diagnosis not present

## 2022-11-11 DIAGNOSIS — K5792 Diverticulitis of intestine, part unspecified, without perforation or abscess without bleeding: Secondary | ICD-10-CM | POA: Diagnosis not present

## 2022-11-11 DIAGNOSIS — R1030 Lower abdominal pain, unspecified: Secondary | ICD-10-CM | POA: Diagnosis not present

## 2022-11-11 LAB — COMPREHENSIVE METABOLIC PANEL
ALT: 20 U/L (ref 0–53)
AST: 23 U/L (ref 0–37)
Albumin: 4.5 g/dL (ref 3.5–5.2)
Alkaline Phosphatase: 46 U/L (ref 39–117)
BUN: 17 mg/dL (ref 6–23)
CO2: 28 mEq/L (ref 19–32)
Calcium: 10.3 mg/dL (ref 8.4–10.5)
Chloride: 102 mEq/L (ref 96–112)
Creatinine, Ser: 1.16 mg/dL (ref 0.40–1.50)
GFR: 67.13 mL/min (ref >60.00)
Glucose, Bld: 96 mg/dL (ref 70–99)
Potassium: 4.6 mEq/L (ref 3.5–5.1)
Sodium: 138 mEq/L (ref 135–145)
Total Bilirubin: 0.6 mg/dL (ref 0.2–1.2)
Total Protein: 7.8 g/dL (ref 6.0–8.3)

## 2022-11-11 LAB — CBC WITH DIFFERENTIAL/PLATELET
Basophils Absolute: 0 10*3/uL (ref 0.0–0.1)
Basophils Relative: 0.5 % (ref 0.0–3.0)
Eosinophils Absolute: 0 10*3/uL (ref 0.0–0.7)
Eosinophils Relative: 0.6 % (ref 0.0–5.0)
HCT: 48.1 % (ref 39.0–52.0)
Hemoglobin: 16.5 g/dL (ref 13.0–17.0)
Lymphocytes Relative: 22.8 % (ref 12.0–46.0)
Lymphs Abs: 1.7 10*3/uL (ref 0.7–4.0)
MCHC: 34.4 g/dL (ref 30.0–36.0)
MCV: 91 fl (ref 78.0–100.0)
Monocytes Absolute: 0.5 10*3/uL (ref 0.1–1.0)
Monocytes Relative: 6.5 % (ref 3.0–12.0)
Neutro Abs: 5.2 10*3/uL (ref 1.4–7.7)
Neutrophils Relative %: 69.6 % (ref 43.0–77.0)
Platelets: 327 10*3/uL (ref 150.0–400.0)
RBC: 5.28 Mil/uL (ref 4.22–5.81)
RDW: 13.3 % (ref 11.5–15.5)
WBC: 7.5 10*3/uL (ref 4.0–10.5)

## 2022-11-11 LAB — POCT URINALYSIS DIPSTICK
Bilirubin, UA: NEGATIVE
Blood, UA: NEGATIVE
Glucose, UA: NEGATIVE
Ketones, UA: NEGATIVE
Leukocytes, UA: NEGATIVE
Nitrite, UA: NEGATIVE
Protein, UA: NEGATIVE
Spec Grav, UA: 1.02 (ref 1.010–1.025)
Urobilinogen, UA: 0.2 E.U./dL
pH, UA: 6 (ref 5.0–8.0)

## 2022-11-11 LAB — LIPASE: Lipase: 11 U/L (ref 11.0–59.0)

## 2022-11-11 MED ORDER — CIPROFLOXACIN HCL 500 MG PO TABS
500.0000 mg | ORAL_TABLET | Freq: Two times a day (BID) | ORAL | 0 refills | Status: AC
  Filled 2022-11-11: qty 10, 5d supply, fill #0

## 2022-11-11 MED ORDER — METRONIDAZOLE 500 MG PO TABS
500.0000 mg | ORAL_TABLET | Freq: Three times a day (TID) | ORAL | 0 refills | Status: AC
  Filled 2022-11-11: qty 21, 7d supply, fill #0

## 2022-11-11 NOTE — Progress Notes (Signed)
Acute Office Visit  Subjective:     Patient ID: Dustin [name], male    DOB: 02-Jun-1961, 62 y.o.   MRN: [id number]  Chief Complaint  Patient presents with   Back Pain    Noticed the pain on Sunday. Sharp pain. Radiates to abdomen. Scrotum more tender than normal. Taking tylenol. With diarrhea, feeling nausea.     HPI Patient is in today for acute back pain.  Patient endorses noticing sharp, bilateral lower back pain without radiation into LEs last week.  2 days prior to symptoms pt moved a compressor/ or other heavy construction equipment from a truck bed without assistance.  Pain at times radiates to abdomen.  Patient also had intermittent loose stools.  Endorses scrotum may be more sensitive/tender, but not edematous.  Denies groin pain or bulging, constipation, dysuria, hematuria, renal calculi.  Tried Tylenol for symptoms  ROS  Positive ROS as stated above otherwise negative.    Objective:    BP 120/86 (BP Location: Right Arm, Cuff Size: Large)   Pulse 99   Temp 98.1 F (36.7 C) (Oral)   Wt 197 lb 9.6 oz (89.6 kg)   SpO2 98%   BMI 26.07 kg/m    Physical Exam Constitutional:      Appearance: Normal appearance.  HENT:     Head: Normocephalic and atraumatic.     Nose: Nose normal.     Mouth/Throat:     Mouth: Mucous membranes are moist.  Eyes:     Extraocular Movements: Extraocular movements intact.     Conjunctiva/sclera: Conjunctivae normal.     Pupils: Pupils are equal, round, and reactive to light.  Cardiovascular:     Rate and Rhythm: Normal rate and regular rhythm.     Heart sounds: Normal heart sounds.  Pulmonary:     Effort: Pulmonary effort is normal.     Breath sounds: Normal breath sounds. No wheezing, rhonchi or rales.  Abdominal:     General: Bowel sounds are normal. There is no distension.     Palpations: Abdomen is soft.     Tenderness: There is no abdominal tenderness. There is no right CVA tenderness, left CVA tenderness, guarding or rebound.      Hernia: No hernia is present.  Musculoskeletal:     Cervical back: Normal.     Thoracic back: Normal.     Lumbar back: No swelling, edema, deformity, spasms, tenderness or bony tenderness. Normal range of motion.  Skin:    General: Skin is warm.     Results for orders placed or performed in visit on 11/11/22  CMP  Result Value Ref Range   Sodium 138 135 - 145 mEq/L   Potassium 4.6 3.5 - 5.1 mEq/L   Chloride 102 96 - 112 mEq/L   CO2 28 19 - 32 mEq/L   Glucose, Bld 96 70 - 99 mg/dL   BUN 17 6 - 23 mg/dL   Creatinine, Ser 1.16 0.40 - 1.50 mg/dL   Total Bilirubin 0.6 0.2 - 1.2 mg/dL   Alkaline Phosphatase 46 39 - 117 U/L   AST 23 0 - 37 U/L   ALT 20 0 - 53 U/L   Total Protein 7.8 6.0 - 8.3 g/dL   Albumin 4.5 3.5 - 5.2 g/dL   GFR 67.13 >60.00 mL/min   Calcium 10.3 8.4 - 10.5 mg/dL  CBC with Differential/Platelet  Result Value Ref Range   WBC 7.5 4.0 - 10.5 K/uL   RBC 5.28 4.22 - 5.81 Mil/uL  Hemoglobin 16.5 13.0 - 17.0 g/dL   HCT 48.1 39.0 - 52.0 %   MCV 91.0 78.0 - 100.0 fl   MCHC 34.4 30.0 - 36.0 g/dL   RDW 13.3 11.5 - 15.5 %   Platelets 327.0 150.0 - 400.0 K/uL   Neutrophils Relative % 69.6 43.0 - 77.0 %   Lymphocytes Relative 22.8 12.0 - 46.0 %   Monocytes Relative 6.5 3.0 - 12.0 %   Eosinophils Relative 0.6 0.0 - 5.0 %   Basophils Relative 0.5 0.0 - 3.0 %   Neutro Abs 5.2 1.4 - 7.7 K/uL   Lymphs Abs 1.7 0.7 - 4.0 K/uL   Monocytes Absolute 0.5 0.1 - 1.0 K/uL   Eosinophils Absolute 0.0 0.0 - 0.7 K/uL   Basophils Absolute 0.0 0.0 - 0.1 K/uL  Lipase  Result Value Ref Range   Lipase 11.0 11.0 - 59.0 U/L  POC Urinalysis Dipstick  Result Value Ref Range   Color, UA yellow    Clarity, UA clear    Glucose, UA Negative Negative   Bilirubin, UA negative    Ketones, UA negative    Spec Grav, UA 1.020 1.010 - 1.025   Blood, UA negative    pH, UA 6.0 5.0 - 8.0   Protein, UA Negative Negative   Urobilinogen, UA 0.2 0.2 or 1.0 E.U./dL   Nitrite, UA negative     Leukocytes, UA Negative Negative   Appearance     Odor          Assessment & Plan:   Problem List Items Addressed This Visit   None Visit Diagnoses     Acute bilateral low back pain without sciatica    -  Primary   Relevant Orders   POC Urinalysis Dipstick (Completed)   DG Lumbar Spine Complete (Completed)   CMP (Completed)   Diverticulitis       Relevant Orders   CMP (Completed)   CBC with Differential/Platelet (Completed)   Lower abdominal pain       Relevant Orders   CMP (Completed)   CBC with Differential/Platelet (Completed)   Loose stools       Relevant Orders   CMP (Completed)   CBC with Differential/Platelet (Completed)   Lipase (Completed)       Meds ordered this encounter  Medications   ciprofloxacin (CIPRO) 500 MG tablet    Sig: Take 1 tablet (500 mg total) by mouth 2 (two) times daily for 5 days.    Dispense:  10 tablet    Refill:  0    confirmed with doc that quantity should be #10 tabs   metroNIDAZOLE (FLAGYL) 500 MG tablet    Sig: Take 1 tablet (500 mg total) by mouth 3 (three) times daily for 7 days.    Dispense:  21 tablet    Refill:  0  History of diverticulosis concern for diverticulitis given increased loose stools and abdominal pain.  Obtain labs.  Will start Flagyl and Cipro.  Musculoskeletal exam normal.  Given history of heavy lifting prior to start of symptoms obtain lumbar spine x-ray.  Continue supportive care including heat, massage, stretching.  Further recommendations based on results of imaging  Return in about 1 week (around 11/18/2022), or if symptoms worsen or fail to improve.  Billie Ruddy, MD

## 2022-11-13 ENCOUNTER — Other Ambulatory Visit: Payer: Self-pay | Admitting: Family Medicine

## 2022-11-13 DIAGNOSIS — N401 Enlarged prostate with lower urinary tract symptoms: Secondary | ICD-10-CM

## 2022-11-13 DIAGNOSIS — R300 Dysuria: Secondary | ICD-10-CM

## 2022-11-13 DIAGNOSIS — R350 Frequency of micturition: Secondary | ICD-10-CM

## 2022-11-15 ENCOUNTER — Telehealth: Payer: Self-pay | Admitting: Family Medicine

## 2022-11-15 NOTE — Telephone Encounter (Signed)
Requesting office visit notes, plan of care. referral info for physical therapy. Patient requested to be seen there. He is being seen with them 11/16/22 at 9am. Fax is [phone]

## 2022-11-16 NOTE — Telephone Encounter (Signed)
Attempt to reach pt regarding to message below. Left a voicemail to call us back.   Contact Renee. She inform they receive an email referral from pt that has a picture of Dr. Volanda Napoleon with stamp "9:16am". They needed office note, plan of care. Inform her there is no referral placed for pt. Inform we left a vm for pt to call us back. Renee advise to speak with patient and update her.

## 2022-11-17 NOTE — Telephone Encounter (Signed)
Pt return the call. He updates he read the x-ray result and went ahead and schedule an appt with his previous PT. He was last seen with them March of 2022. Had a ppt with them with yesterday and follow up appt tomorrow.   Did not see a referral that was being sent for Physical Therapy in the record, okay for me to fax info to [facility]? Please advise.

## 2022-11-25 ENCOUNTER — Other Ambulatory Visit: Payer: Self-pay | Admitting: Family Medicine

## 2022-11-25 DIAGNOSIS — M47816 Spondylosis without myelopathy or radiculopathy, lumbar region: Secondary | ICD-10-CM

## 2022-11-25 DIAGNOSIS — M545 Low back pain, unspecified: Secondary | ICD-10-CM

## 2022-11-28 ENCOUNTER — Encounter (HOSPITAL_BASED_OUTPATIENT_CLINIC_OR_DEPARTMENT_OTHER): Payer: Self-pay | Admitting: Cardiology

## 2022-12-06 ENCOUNTER — Encounter: Payer: Self-pay | Admitting: Family Medicine

## 2022-12-06 ENCOUNTER — Ambulatory Visit: Payer: PRIVATE HEALTH INSURANCE | Admitting: Family Medicine

## 2022-12-06 ENCOUNTER — Telehealth: Payer: Self-pay | Admitting: Family Medicine

## 2022-12-06 VITALS — BP 108/68 | HR 94 | Temp 98.0°F | Ht 73.0 in | Wt 195.3 lb

## 2022-12-06 DIAGNOSIS — R1033 Periumbilical pain: Secondary | ICD-10-CM | POA: Diagnosis not present

## 2022-12-06 DIAGNOSIS — R350 Frequency of micturition: Secondary | ICD-10-CM | POA: Diagnosis not present

## 2022-12-06 DIAGNOSIS — N401 Enlarged prostate with lower urinary tract symptoms: Secondary | ICD-10-CM

## 2022-12-06 DIAGNOSIS — R300 Dysuria: Secondary | ICD-10-CM

## 2022-12-06 LAB — POCT URINALYSIS DIPSTICK
Bilirubin, UA: NEGATIVE
Blood, UA: NEGATIVE
Glucose, UA: NEGATIVE
Ketones, UA: POSITIVE
Leukocytes, UA: NEGATIVE
Nitrite, UA: NEGATIVE
Protein, UA: NEGATIVE
Spec Grav, UA: 1.025 (ref 1.010–1.025)
Urobilinogen, UA: 0.2 E.U./dL
pH, UA: 6 (ref 5.0–8.0)

## 2022-12-06 NOTE — Progress Notes (Signed)
Established Patient Office Visit  Subjective   Patient ID: Dustin [name], male    DOB: 1960/12/16  Age: 62 y.o. MRN: [id number]  Chief Complaint  Patient presents with   Abdominal Pain    Patient complains of sudden onset of lower umbilical pain since 12am   Generalized Body Aches    Noted since 2am   Diarrhea    Intermittently x1 week    Patient reports he was woken from sleep with a sharp stabbing pain in his umbilicus. He states that it lasted for a couple of hours, put a heating pad on it which helped it some. Pt reports some mild nausea yesterday. Started getting body aches also, had 2 episodes of diarrhea. States that his hands felt cold like he had a fever. [place] pt reports that he didn't really eat anything out of the ordinary. Did have a sausage egg burrito. States that he was working before that and felt ok.   Abdominal Pain This is a new problem. The current episode started today. The problem has been gradually improving. The pain is located in the periumbilical region and RLQ. The pain is at a severity of 8/10. The pain is severe. The quality of the pain is sharp. The abdominal pain does not radiate. Associated symptoms include dysuria. Nothing aggravates the pain. He has tried nothing for the symptoms.      Review of Systems  Genitourinary:  Positive for dysuria.  All other systems reviewed and are negative.     Objective:     BP 108/68 (BP Location: Right Arm, Patient Position: Sitting, Cuff Size: Large)   Pulse 94   Temp 98 F (36.7 C) (Oral)   Ht 6\' 1"  (1.854 m)   Wt 195 lb 4.8 oz (88.6 kg)   SpO2 98%   BMI 25.77 kg/m    Physical Exam Vitals reviewed.  Constitutional:      Appearance: Normal appearance. He is well-groomed and normal weight.  Eyes:     Conjunctiva/sclera: Conjunctivae normal.  Cardiovascular:     Rate and Rhythm: Normal rate and regular rhythm.     Pulses: Normal pulses.     Heart sounds: S1  normal and S2 normal. No murmur heard. Pulmonary:     Effort: Pulmonary effort is normal.     Breath sounds: Normal breath sounds and air entry. No rales.  Abdominal:     General: Abdomen is flat. Bowel sounds are normal.     Palpations: Abdomen is soft.     Tenderness: There is abdominal tenderness in the right lower quadrant and periumbilical area.  Musculoskeletal:     Right lower leg: No edema.     Left lower leg: No edema.  Neurological:     General: No focal deficit present.     Mental Status: He is alert and oriented to person, place, and time.     Gait: Gait is intact.     Deep Tendon Reflexes: Reflexes are normal and symmetric.  Psychiatric:        Mood and Affect: Mood and affect normal.      Results for orders placed or performed in visit on 12/06/22  POC Urinalysis Dipstick  Result Value Ref Range   Color, UA yellow    Clarity, UA clear    Glucose, UA Negative Negative   Bilirubin, UA negative    Ketones, UA positive    Spec Grav, UA 1.025 1.010 - 1.025  Blood, UA negative    pH, UA 6.0 5.0 - 8.0   Protein, UA Negative Negative   Urobilinogen, UA 0.2 0.2 or 1.0 E.U./dL   Nitrite, UA negative    Leukocytes, UA Negative Negative   Appearance     Odor        The 10-year ASCVD risk score (Arnett DK, et al., 2019) is: 9%    Assessment & Plan:   Problem List Items Addressed This Visit   None Visit Diagnoses     Dysuria    -  Primary   Relevant Orders   POC Urinalysis Dipstick (Completed)   Acute periumbilical pain       Relevant Orders   CT ABDOMEN PELVIS W WO CONTRAST   Urinary frequency       Benign prostatic hyperplasia with urinary frequency         UA is negative for blood and infection, his pain is improved since it first started but he is still tender in in the RLQ. Is able to eat and drink without problems. Differential diagnosis includes acute gastroenteritis, food poisoning, or possibly even appendicitis given the location of his pain  symptoms, although if it were appendicitis he would be much sicker. I recommended getting a CT abd/pelvis to rule this out. He just recently had CMP done 3 weeks ago and it was WNL. I advised the patient that if his symptoms worsened or if new symptoms developed he should go to the ER for evaluation.   No follow-ups on file.    Karie Georges, MD

## 2022-12-07 NOTE — Telephone Encounter (Signed)
Error. Please disregard

## 2022-12-14 ENCOUNTER — Telehealth: Payer: Self-pay | Admitting: Family Medicine

## 2022-12-14 NOTE — Telephone Encounter (Signed)
Ok to write work excuse for patient for the dates listed above.

## 2022-12-14 NOTE — Telephone Encounter (Signed)
Requesting work excuse to cover him from 12/06/22-12/13/22. Will come by to pick up and pls add copy to mychart.

## 2022-12-15 ENCOUNTER — Encounter: Payer: Self-pay | Admitting: *Deleted

## 2022-12-22 ENCOUNTER — Telehealth: Payer: Self-pay | Admitting: Family Medicine

## 2022-12-22 NOTE — Telephone Encounter (Signed)
Please advise 

## 2022-12-22 NOTE — Telephone Encounter (Signed)
PA is needed CT abdomen pelvis w and w/o contrast Cpt 74178, facility's NPI [id number] TIN [id number] [address] [place] [id number] [place] [id number]

## 2022-12-23 NOTE — Telephone Encounter (Signed)
Left voicemail for patient to call back with his insurance information. I attempted to get PA for for his upcoming appointment at [facility] for his CT but the information in patient chart didn't pull up when I contacted [employer].   Please let me know when patient calls back and If I'm not available please take down Subscriber ID #, Group #, and the number on the back for prior authorization.   Thanks,  Paula Libra

## 2022-12-24 NOTE — Telephone Encounter (Signed)
Has already initiated PA. case number of [id number], requesting clinical info be faxed to [phone]. [name] Requesting a call at [phone] to confirm

## 2022-12-27 NOTE — Telephone Encounter (Signed)
[facility] is calling and pt member number is [id number] group number [facility] tax ID [id number] NPI [id number]. Pt has appt 2-20-130 pm. Please fax clinical to [phone] please put case number [id number] on clinical notes

## 2022-12-27 NOTE — Telephone Encounter (Signed)
Clinicals was sent in on Friday. Resent them today.

## 2022-12-30 ENCOUNTER — Other Ambulatory Visit: Payer: Self-pay | Admitting: Family Medicine

## 2022-12-30 ENCOUNTER — Other Ambulatory Visit (HOSPITAL_BASED_OUTPATIENT_CLINIC_OR_DEPARTMENT_OTHER): Payer: Self-pay

## 2022-12-30 DIAGNOSIS — K219 Gastro-esophageal reflux disease without esophagitis: Secondary | ICD-10-CM

## 2022-12-30 MED FILL — Lisinopril Tab 10 MG: ORAL | 90 days supply | Qty: 90 | Fill #0 | Status: AC

## 2022-12-30 MED FILL — Tamsulosin HCl Cap 0.4 MG: ORAL | 90 days supply | Qty: 90 | Fill #0 | Status: AC

## 2023-01-06 ENCOUNTER — Other Ambulatory Visit (HOSPITAL_BASED_OUTPATIENT_CLINIC_OR_DEPARTMENT_OTHER): Payer: Self-pay

## 2023-01-06 MED FILL — Famotidine Tab 20 MG: ORAL | 30 days supply | Qty: 30 | Fill #0 | Status: AC

## 2023-01-06 MED FILL — Pantoprazole Sodium EC Tab 20 MG (Base Equiv): ORAL | 90 days supply | Qty: 90 | Fill #0 | Status: AC

## 2023-04-01 ENCOUNTER — Other Ambulatory Visit (HOSPITAL_BASED_OUTPATIENT_CLINIC_OR_DEPARTMENT_OTHER): Payer: Self-pay

## 2023-04-01 ENCOUNTER — Other Ambulatory Visit: Payer: Self-pay | Admitting: Family Medicine

## 2023-04-01 DIAGNOSIS — R300 Dysuria: Secondary | ICD-10-CM

## 2023-04-01 DIAGNOSIS — N401 Enlarged prostate with lower urinary tract symptoms: Secondary | ICD-10-CM

## 2023-04-01 DIAGNOSIS — R350 Frequency of micturition: Secondary | ICD-10-CM

## 2023-04-01 MED ORDER — TAMSULOSIN HCL 0.4 MG PO CAPS
0.4000 mg | ORAL_CAPSULE | Freq: Every day | ORAL | 0 refills | Status: DC
  Filled 2023-04-01: qty 90, 90d supply, fill #0

## 2023-04-01 MED FILL — Lisinopril Tab 10 MG: ORAL | 90 days supply | Qty: 90 | Fill #1 | Status: CN

## 2023-04-01 MED FILL — Lisinopril Tab 10 MG: ORAL | 90 days supply | Qty: 90 | Fill #1 | Status: AC

## 2023-04-01 MED FILL — Pantoprazole Sodium EC Tab 20 MG (Base Equiv): ORAL | 90 days supply | Qty: 90 | Fill #1 | Status: AC

## 2023-04-01 MED FILL — Pantoprazole Sodium EC Tab 20 MG (Base Equiv): ORAL | 90 days supply | Qty: 90 | Fill #1 | Status: CN

## 2023-05-06 ENCOUNTER — Encounter: Payer: Self-pay | Admitting: Family Medicine

## 2023-05-06 ENCOUNTER — Ambulatory Visit (INDEPENDENT_AMBULATORY_CARE_PROVIDER_SITE_OTHER): Payer: PRIVATE HEALTH INSURANCE | Admitting: Family Medicine

## 2023-05-06 VITALS — BP 124/82 | HR 70 | Temp 97.7°F | Ht 72.0 in | Wt 192.8 lb

## 2023-05-06 DIAGNOSIS — F419 Anxiety disorder, unspecified: Secondary | ICD-10-CM

## 2023-05-06 DIAGNOSIS — Z0000 Encounter for general adult medical examination without abnormal findings: Secondary | ICD-10-CM | POA: Diagnosis not present

## 2023-05-06 DIAGNOSIS — R002 Palpitations: Secondary | ICD-10-CM | POA: Diagnosis not present

## 2023-05-06 DIAGNOSIS — Z125 Encounter for screening for malignant neoplasm of prostate: Secondary | ICD-10-CM

## 2023-05-06 DIAGNOSIS — R202 Paresthesia of skin: Secondary | ICD-10-CM

## 2023-05-06 DIAGNOSIS — R238 Other skin changes: Secondary | ICD-10-CM | POA: Diagnosis not present

## 2023-05-06 DIAGNOSIS — I10 Essential (primary) hypertension: Secondary | ICD-10-CM | POA: Diagnosis not present

## 2023-05-06 LAB — COMPREHENSIVE METABOLIC PANEL
ALT: 15 U/L (ref 0–53)
AST: 18 U/L (ref 0–37)
Albumin: 4.7 g/dL (ref 3.5–5.2)
Alkaline Phosphatase: 50 U/L (ref 39–117)
BUN: 20 mg/dL (ref 6–23)
CO2: 27 mEq/L (ref 19–32)
Calcium: 10.2 mg/dL (ref 8.4–10.5)
Chloride: 102 mEq/L (ref 96–112)
Creatinine, Ser: 1.15 mg/dL (ref 0.40–1.50)
GFR: 67.61 mL/min (ref >60.00)
Glucose, Bld: 95 mg/dL (ref 70–99)
Potassium: 4.9 mEq/L (ref 3.5–5.1)
Sodium: 137 mEq/L (ref 135–145)
Total Bilirubin: 1 mg/dL (ref 0.2–1.2)
Total Protein: 7.6 g/dL (ref 6.0–8.3)

## 2023-05-06 LAB — VITAMIN B12: Vitamin B-12: 879 pg/mL (ref 211–911)

## 2023-05-06 LAB — POCT URINALYSIS DIPSTICK
Bilirubin, UA: NEGATIVE
Blood, UA: NEGATIVE
Glucose, UA: NEGATIVE
Ketones, UA: NEGATIVE
Leukocytes, UA: NEGATIVE
Nitrite, UA: NEGATIVE
Protein, UA: NEGATIVE
Spec Grav, UA: 1.015 (ref 1.010–1.025)
Urobilinogen, UA: NEGATIVE E.U./dL — AB
pH, UA: 6 (ref 5.0–8.0)

## 2023-05-06 LAB — CBC WITH DIFFERENTIAL/PLATELET
Basophils Absolute: 0 10*3/uL (ref 0.0–0.1)
Basophils Relative: 0.3 % (ref 0.0–3.0)
Eosinophils Absolute: 0 10*3/uL (ref 0.0–0.7)
Eosinophils Relative: 0.6 % (ref 0.0–5.0)
HCT: 45.1 % (ref 39.0–52.0)
Hemoglobin: 15.2 g/dL (ref 13.0–17.0)
Lymphocytes Relative: 20.5 % (ref 12.0–46.0)
Lymphs Abs: 1.4 10*3/uL (ref 0.7–4.0)
MCHC: 33.8 g/dL (ref 30.0–36.0)
MCV: 93 fl (ref 78.0–100.0)
Monocytes Absolute: 0.5 10*3/uL (ref 0.1–1.0)
Monocytes Relative: 7.2 % (ref 3.0–12.0)
Neutro Abs: 4.8 10*3/uL (ref 1.4–7.7)
Neutrophils Relative %: 71.4 % (ref 43.0–77.0)
Platelets: 317 10*3/uL (ref 150.0–400.0)
RBC: 4.85 Mil/uL (ref 4.22–5.81)
RDW: 13.2 % (ref 11.5–15.5)
WBC: 6.7 10*3/uL (ref 4.0–10.5)

## 2023-05-06 LAB — LIPID PANEL
Cholesterol: 184 mg/dL (ref 0–200)
HDL: 57.3 mg/dL (ref >39.00)
LDL Cholesterol: 105 mg/dL — ABNORMAL HIGH (ref 0–99)
NonHDL: 126.54
Total CHOL/HDL Ratio: 3
Triglycerides: 106 mg/dL (ref 0.0–149.0)
VLDL: 21.2 mg/dL (ref 0.0–40.0)

## 2023-05-06 LAB — PSA: PSA: 0.72 ng/mL (ref 0.10–4.00)

## 2023-05-06 LAB — T4, FREE: Free T4: 0.99 ng/dL (ref 0.60–1.60)

## 2023-05-06 LAB — TSH: TSH: 1.2 u[IU]/mL (ref 0.35–5.50)

## 2023-05-06 LAB — HEMOGLOBIN A1C: Hgb A1c MFr Bld: 5.5 % (ref 4.6–6.5)

## 2023-05-06 LAB — MAGNESIUM: Magnesium: 2.1 mg/dL (ref 1.5–2.5)

## 2023-05-06 NOTE — Progress Notes (Signed)
Established Patient Office Visit   Subjective  Patient ID: Dustin [name], male    DOB: [birth year]  Age: 62 y.o. MRN: [id number]  Chief Complaint  Patient presents with   Annual Exam    Patient is a 61 year old male seen for CPE.  Patient states he has had pins-and-needles sensation in extremities.  Also notes muscle cramps, 5 times out of the week.  Will drink pickle juice.  Typically occur at night.  Trying to stay hydrated.  Has diarrhea some mornings due to history of bowel symptoms.  Patient notes increased anxiety.  Patient also notes if lays on back at night feels like cannot breathe out.  Has a 4 mm round papule left side of neck.  Has scratched the area.  Patient also has a raised spot on middle of left tongue.  States he bit his tongue in that same spot a few times in the growth has remained.  Denies pain, bleeding, irritation.   Patient Active Problem List   Diagnosis Date Noted   PVC (premature ventricular contraction) 10/03/2019   Paroxysmal SVT (supraventricular tachycardia) 10/03/2019   History of chest pain 08/27/2019   Palpitation 08/27/2019   Elevated blood pressure reading 08/27/2019   Essential hypertension 01/09/2016   Renal colic 01/09/2016   Impaired glucose tolerance 01/09/2016   Past Surgical History:  Procedure Laterality Date   CHOLECYSTECTOMY  2003   UMBILICAL HERNIA REPAIR  2003   WISDOM TOOTH EXTRACTION     Social History   Tobacco Use   Smoking status: Never   Smokeless tobacco: Never  Substance Use Topics   Alcohol use: Yes    Comment: occ beer   Drug use: No   Family History  Problem Relation Age of Onset   Hypertension Mother    Cancer Sister        melanoma   CAD Father        CABG at age 15-60   Heart disease Sister        heart attack 11/2010 - stents placed at age 12-60   Colon cancer Neg Hx    Colon polyps Neg Hx    Rectal cancer Neg Hx    Stomach cancer Neg Hx    Allergies  Allergen Reactions   Novocain [Procaine]  Swelling      ROS Negative unless stated above    Objective:     BP 124/82 (BP Location: Right Arm, Patient Position: Sitting, Cuff Size: Large)   Pulse 70   Temp 97.7 F (36.5 C) (Oral)   Ht 6' (1.829 m)   Wt 192 lb 12.8 oz (87.5 kg)   SpO2 98%   BMI 26.15 kg/m    Physical Exam Constitutional:      Appearance: Normal appearance.  HENT:     Head: Normocephalic and atraumatic.     Right Ear: Tympanic membrane, ear canal and external ear normal.     Left Ear: Tympanic membrane, ear canal and external ear normal.     Nose: Nose normal.     Mouth/Throat:     Mouth: Mucous membranes are moist.     Pharynx: No oropharyngeal exudate or posterior oropharyngeal erythema.  Eyes:     General: No scleral icterus.    Extraocular Movements: Extraocular movements intact.     Conjunctiva/sclera: Conjunctivae normal.     Pupils: Pupils are equal, round, and reactive to light.  Neck:     Thyroid: No thyromegaly.  Cardiovascular:     Rate and  Rhythm: Normal rate and regular rhythm.     Pulses: Normal pulses.     Heart sounds: Normal heart sounds. No murmur heard.    No friction rub.  Pulmonary:     Effort: Pulmonary effort is normal.     Breath sounds: Normal breath sounds. No wheezing, rhonchi or rales.  Abdominal:     General: Bowel sounds are normal.     Palpations: Abdomen is soft.     Tenderness: There is no abdominal tenderness.  Musculoskeletal:        General: No deformity. Normal range of motion.  Lymphadenopathy:     Cervical: No cervical adenopathy.  Skin:    General: Skin is warm and dry.     Findings: No lesion.     Comments: Left lateral/posterior neck with a 4 mm round papule.  Small area of eschar present.  Sun damaged skin on neck.  Neurological:     General: No focal deficit present.     Mental Status: He is alert and oriented to person, place, and time.  Psychiatric:        Mood and Affect: Mood normal.        Thought Content: Thought content normal.        05/06/2023   10:44 AM 12/06/2022    3:24 PM 11/11/2022    1:06 PM  Depression screen PHQ 2/9  Decreased Interest 0 0 0  Down, Depressed, Hopeless 0 0 0  PHQ - 2 Score 0 0 0  Altered sleeping 0 0 0  Tired, decreased energy 0 0 0  Change in appetite 0 0 0  Feeling bad or failure about yourself  0 0 0  Trouble concentrating 0 0 0  Moving slowly or fidgety/restless 0 0 0  Suicidal thoughts 0 0 0  PHQ-9 Score 0 0 0      05/06/2023   10:44 AM 02/11/2021    1:29 PM  GAD 7 : Generalized Anxiety Score  Nervous, Anxious, on Edge 1 3  Control/stop worrying 0 1  Worry too much - different things 0 1  Trouble relaxing  0  Restless 0 0  Easily annoyed or irritable 0 0  Afraid - awful might happen 0 0  Total GAD 7 Score  5  Anxiety Difficulty  Not difficult at all       No results found for any visits on 05/06/23.    Assessment & Plan:  Well adult exam -Age-appropriate health screenings discussed. -Immunizations reviewed.  Tdap up-to-date.  Consider shingles vaccine. -Colonoscopy due in 2028 -     CBC with Differential/Platelet -     Hemoglobin A1c -     Lipid panel -     POCT urinalysis dipstick  Papule of skin -Given history of sun exposure and prior skin cancer, follow-up with dermatologist for removal advised.  Palpitations -Stable -     TSH -     T4, free -     Magnesium -     Comprehensive metabolic panel  Paresthesias -     Hemoglobin A1c -     Vitamin B12 -     Comprehensive metabolic panel  Essential hypertension -Controlled -Lisinopril 10 mg daily -     POCT urinalysis dipstick  Screening for prostate cancer -     PSA  Anxiety -GAD-7 score 0 -Self-care, mindfulness   Return in about 2 months (around 07/06/2023), or if symptoms worsen or fail to improve.  Next CPE in 1 year.  Billie Ruddy, MD

## 2023-06-15 ENCOUNTER — Ambulatory Visit (INDEPENDENT_AMBULATORY_CARE_PROVIDER_SITE_OTHER): Payer: PRIVATE HEALTH INSURANCE | Admitting: Family Medicine

## 2023-06-15 ENCOUNTER — Encounter: Payer: Self-pay | Admitting: Family Medicine

## 2023-06-15 ENCOUNTER — Other Ambulatory Visit (HOSPITAL_BASED_OUTPATIENT_CLINIC_OR_DEPARTMENT_OTHER): Payer: Self-pay

## 2023-06-15 ENCOUNTER — Encounter: Payer: Self-pay | Admitting: Neurology

## 2023-06-15 VITALS — BP 128/82 | HR 73 | Temp 97.7°F | Wt 198.2 lb

## 2023-06-15 DIAGNOSIS — H1031 Unspecified acute conjunctivitis, right eye: Secondary | ICD-10-CM

## 2023-06-15 DIAGNOSIS — R202 Paresthesia of skin: Secondary | ICD-10-CM

## 2023-06-15 DIAGNOSIS — T148XXA Other injury of unspecified body region, initial encounter: Secondary | ICD-10-CM | POA: Diagnosis not present

## 2023-06-15 DIAGNOSIS — M6208 Separation of muscle (nontraumatic), other site: Secondary | ICD-10-CM

## 2023-06-15 MED ORDER — POLYMYXIN B-TRIMETHOPRIM 10000-0.1 UNIT/ML-% OP SOLN
1.0000 [drp] | OPHTHALMIC | 0 refills | Status: AC
Start: 2023-06-15 — End: 2023-06-22
  Filled 2023-06-15: qty 10, 7d supply, fill #0

## 2023-06-15 NOTE — Patient Instructions (Signed)
A referral was placed to neurology for the paresthesias in bilateral feet.  A prescription for antibacterial eyedrop was sent to your local pharmacy.

## 2023-06-15 NOTE — Progress Notes (Signed)
Established Patient Office Visit   Subjective  Patient ID: Dustin [name], male    DOB: April 28, 1961  Age: 62 y.o. MRN: [id number]  Chief Complaint  Patient presents with   Cyst    On left shin, noticed 3-4 wks ago when it first appeared. Hard, does not move. 6/19 twisted ankle really bad and noticed the know on shin 2-3 wks late, about 2 in around, swollen. Does not cause any pain. It is just above the ankle on the inside of leg.     Patient is a 62 year old male seen for ongoing concerns.  Patient notices a bulge in midline abdomen with laying down and attempting to sit up.  States area is not painful.  Patient also notes right eye irritation, discharge, crusting that started last week then resolved after using Visine eyedrops.  Irritation and redness seem to be reappearing today.  Patient also notes right-sided facial and head tingling that started today.  Patient states often happens with infection.  Endorses some nasal drainage/allergy symptoms.  Patient notes spraining left ankle after sitting on the toilet foot became numb upon standing.  Patient had ecchymosis of ankle and foot.  Recently noticed a firm nontender area on left shin.  Patient mentions he was able to run several miles the other day without in muscles of legs.  Patient with continued numbness and tingling in the balls of bilateral feet.    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   Past Surgical History:  Procedure Laterality Date   CHOLECYSTECTOMY  2003   UMBILICAL HERNIA REPAIR  2003   WISDOM TOOTH EXTRACTION     Social History   Tobacco Use   Smoking status: Never   Smokeless tobacco: Never  Substance Use Topics   Alcohol use: Yes    Comment: occ beer   Drug use: No   Family History  Problem Relation Age of Onset   Hypertension Mother    Cancer Sister        melanoma   CAD Father         CABG at age 26-60   Heart disease Sister        heart attack 11/2010 - stents placed at age 24-60   Colon cancer Neg Hx    Colon polyps Neg Hx    Rectal cancer Neg Hx    Stomach cancer Neg Hx    Allergies  Allergen Reactions   Novocain [Procaine] Swelling      ROS Negative unless stated above    Objective:     BP 128/82 (BP Location: Right Arm, Patient Position: Sitting, Cuff Size: Normal)   Pulse 73   Temp 97.7 F (36.5 C) (Oral)   Wt 198 lb 3.2 oz (89.9 kg)   SpO2 98%   BMI 26.88 kg/m  BP Readings from Last 3 Encounters:  06/15/23 128/82  05/06/23 124/82  12/06/22 108/68   Wt Readings from Last 3 Encounters:  06/15/23 198 lb 3.2 oz (89.9 kg)  05/06/23 192 lb 12.8 oz (87.5 kg)  12/06/22 195 lb 4.8 oz (88.6 kg)      Physical Exam Constitutional:      General: He is not in acute distress.    Appearance: Normal appearance.  HENT:     Head: Normocephalic and atraumatic.     Nose: Nose normal.     Mouth/Throat:  Mouth: Mucous membranes are moist.  Eyes:     General: Lids are normal.        Right eye: No discharge.        Left eye: No discharge.     Extraocular Movements: Extraocular movements intact.     Conjunctiva/sclera:     Right eye: Right conjunctiva is injected.     Comments: Faint erythema of right pt crusting or tearing noted.  Cardiovascular:     Rate and Rhythm: Normal rate and regular rhythm.     Heart sounds: Normal heart sounds. No murmur heard.    No gallop.  Pulmonary:     Effort: Pulmonary effort is normal. No respiratory distress.     Breath sounds: Normal breath sounds. No wheezing, rhonchi or rales.  Abdominal:     General: There is no distension.     Palpations: Abdomen is soft.     Tenderness: There is no abdominal tenderness.     Comments: Rectus diastases  Skin:    General: Skin is warm and dry.     Comments: Nodule underneath skin of left lower medial shin.  Faint erythema above and below nodule.  Neurological:      Mental Status: He is alert and oriented to person, place, and time.      No results found for any visits on 06/15/23.    Assessment & Plan:  Paresthesias -Tingling in balls of bilateral feet -Prior lab work including electrolytes and vitamins normal. -Discussed EMG/NCS -     Ambulatory referral to Neurology  Rectus diastasis -Strengthening core encouraged.  Hematoma -Supportive care including heat, elevation, compression Continue to monitor  Acute conjunctivitis of right eye, unspecified acute conjunctivitis type -Discussed various causes of conjunctivitis -Continue Visine eyedrops.  If no start polymyxin eye gtts.  Hand hygiene encouraged and OTC medications for seasonal allergies. -     Polymyxin B-Trimethoprim; Place 1 drop into the right eye every 4 (four) hours for 7 days.  Dispense: 10 mL; Refill: 0    Return if symptoms worsen or fail to improve.   [name], MD

## 2023-06-23 MED FILL — Lisinopril Tab 10 MG: ORAL | 90 days supply | Qty: 90 | Fill #2 | Status: AC

## 2023-06-27 ENCOUNTER — Other Ambulatory Visit: Payer: Self-pay

## 2023-06-27 DIAGNOSIS — R202 Paresthesia of skin: Secondary | ICD-10-CM

## 2023-06-28 ENCOUNTER — Other Ambulatory Visit (HOSPITAL_BASED_OUTPATIENT_CLINIC_OR_DEPARTMENT_OTHER): Payer: Self-pay

## 2023-06-28 ENCOUNTER — Other Ambulatory Visit: Payer: Self-pay | Admitting: Family Medicine

## 2023-06-28 DIAGNOSIS — K219 Gastro-esophageal reflux disease without esophagitis: Secondary | ICD-10-CM

## 2023-06-28 MED ORDER — PANTOPRAZOLE SODIUM 20 MG PO TBEC
20.0000 mg | DELAYED_RELEASE_TABLET | Freq: Every day | ORAL | 1 refills | Status: DC
Start: 2023-06-28 — End: 2023-12-22
  Filled 2023-06-28: qty 90, 90d supply, fill #0
  Filled 2023-09-26: qty 90, 90d supply, fill #1

## 2023-06-30 ENCOUNTER — Other Ambulatory Visit (HOSPITAL_BASED_OUTPATIENT_CLINIC_OR_DEPARTMENT_OTHER): Payer: Self-pay

## 2023-07-01 ENCOUNTER — Ambulatory Visit (INDEPENDENT_AMBULATORY_CARE_PROVIDER_SITE_OTHER): Payer: PRIVATE HEALTH INSURANCE | Admitting: Neurology

## 2023-07-01 DIAGNOSIS — R202 Paresthesia of skin: Secondary | ICD-10-CM

## 2023-07-01 NOTE — Procedures (Signed)
  [facility]  [address] [place], [address]  [place], [place] [address] Tel: [phone] Fax: [phone] Test Date:  07/01/2023  Patient: Dustin [name] DOB: February 25, 1961 Physician: Nita Sickle, DO  Sex: Male Height: 6\' 0"  Ref Phys: Abbe Amsterdam, MD  ID#: [id number]   Technician:    History: This is a 62 year old man referred for evaluation of bilateral feet numbness.  Impression: This is an incomplete study as testing was terminated due to pain.     ___________________________ Nita Sickle, DO    Nerve Conduction Studies   Stim Site NR Peak (ms) Norm Peak (ms) O-P Amp (V) Norm O-P Amp  Right Sup Peroneal Anti Sensory (Ant Lat Mall)  32 C  12 cm    2.9 <4.6 7.5 >3     Stim Site NR Onset (ms) Norm Onset (ms) O-P Amp (mV) Norm O-P Amp Site1 Site2 Delta-0 (ms) Dist (cm) Vel (m/s) Norm Vel (m/s)  Right Peroneal Motor (Ext Dig Brev)  32 C  Ankle    4.2 <6.0 *1.0 >2.5 B Fib Ankle 9.1 39.0 43 >40  B Fib    13.3  1.1  Poplt B Fib 2.3 10.0 43 >40  Poplt    15.6  1.0         Right Tibial Motor (Abd Hall Brev)  32 C  Ankle    4.9 <6.0 4.3 >4 Knee Ankle 10.4 43.0 41 >40  Knee    15.3  1.1             Waveforms:

## 2023-07-07 ENCOUNTER — Other Ambulatory Visit: Payer: Self-pay | Admitting: Family Medicine

## 2023-07-07 ENCOUNTER — Other Ambulatory Visit (HOSPITAL_BASED_OUTPATIENT_CLINIC_OR_DEPARTMENT_OTHER): Payer: Self-pay

## 2023-07-07 DIAGNOSIS — R350 Frequency of micturition: Secondary | ICD-10-CM

## 2023-07-07 DIAGNOSIS — N401 Enlarged prostate with lower urinary tract symptoms: Secondary | ICD-10-CM

## 2023-07-07 DIAGNOSIS — R300 Dysuria: Secondary | ICD-10-CM

## 2023-07-07 MED ORDER — TAMSULOSIN HCL 0.4 MG PO CAPS
0.4000 mg | ORAL_CAPSULE | Freq: Every day | ORAL | 2 refills | Status: DC
Start: 2023-07-07 — End: 2024-05-09
  Filled 2023-07-07: qty 90, 90d supply, fill #0
  Filled 2023-10-15: qty 90, 90d supply, fill #1
  Filled 2024-01-25: qty 90, 90d supply, fill #2

## 2023-07-08 ENCOUNTER — Other Ambulatory Visit (HOSPITAL_BASED_OUTPATIENT_CLINIC_OR_DEPARTMENT_OTHER): Payer: Self-pay

## 2023-08-24 ENCOUNTER — Encounter (HOSPITAL_BASED_OUTPATIENT_CLINIC_OR_DEPARTMENT_OTHER): Payer: Self-pay | Admitting: Cardiology

## 2023-08-24 ENCOUNTER — Ambulatory Visit (HOSPITAL_BASED_OUTPATIENT_CLINIC_OR_DEPARTMENT_OTHER): Payer: PRIVATE HEALTH INSURANCE | Admitting: Cardiology

## 2023-08-24 VITALS — BP 128/68 | HR 77 | Ht 73.0 in | Wt 203.0 lb

## 2023-08-24 DIAGNOSIS — Z7189 Other specified counseling: Secondary | ICD-10-CM | POA: Diagnosis not present

## 2023-08-24 DIAGNOSIS — R002 Palpitations: Secondary | ICD-10-CM | POA: Diagnosis not present

## 2023-08-24 DIAGNOSIS — R0789 Other chest pain: Secondary | ICD-10-CM

## 2023-08-24 DIAGNOSIS — I10 Essential (primary) hypertension: Secondary | ICD-10-CM

## 2023-08-24 NOTE — Progress Notes (Signed)
Cardiology Office Note:  .   Date:  08/24/2023  ID:  Dustin [name], DOB 1961/07/22, MRN [id number] PCP: [name], MD  [facility] Providers Cardiologist:  Jodelle Red, MD {  History of Present Illness: .   Dustin [name] is a 62 y.o. male with a hx of HTN, strong FH of CAD who is seen for post hospital follow up. I met him in the hospital on 04/19/19 during an admisison for chest pain.   Cardiac history: longstanding chest pain, normal myoview 2017. Ct coronary 04/2019 during admission for chest pain with calcium score of 0, normal cors, no evidence of CAD. PA measured 37 mm. CT angio chest/dissection done the day prior was unremarkable.  Today: Doing well overall. Works on his house on the weekend every weekend, stays very active. Had one episode of sharp chest pain about 6-8 months ago, brief, self limited. Has mild sock lines but no significant swelling. Has some foot pain and intermittent sensation of cold feet, not constant. Tried to have nerve study but very painful and had to stop. Has rare vertigo. Checks BP at home, well controlled. Palpitations are very rare.  ROS: Denies chest pain, shortness of breath at rest or with normal exertion. No PND, orthopnea, LE edema or unexpected weight gain. No syncope. ROS otherwise negative except as noted.   Studies Reviewed: Marland Kitchen    EKG:  EKG Interpretation Date/Time:  Wednesday August 24 2023 16:00:29 EDT Ventricular Rate:  77 PR Interval:  142 QRS Duration:  70 QT Interval:  346 QTC Calculation: 391 R Axis:   15  Text Interpretation: Normal sinus rhythm Normal ECG When compared with ECG of 24-Mar-2022 11:08, Premature ventricular complexes are no longer Present Vent. rate has decreased BY  45 BPM Confirmed by Jodelle Red [phone]) on 08/24/2023 4:18:01 PM    Physical Exam:   VS:  BP (!) 140/70 (BP Location: Right Arm, Patient Position: Sitting, Cuff Size: Normal)   Pulse 77   Ht 6\' 1"  (1.854 m)   Wt 203  lb (92.1 kg)   SpO2 97%   BMI 26.78 kg/m    Wt Readings from Last 3 Encounters:  08/24/23 203 lb (92.1 kg)  06/15/23 198 lb 3.2 oz (89.9 kg)  05/06/23 192 lb 12.8 oz (87.5 kg)    GEN: Well nourished, well developed in no acute distress HEENT: Normal, moist mucous membranes NECK: No JVD CARDIAC: regular rhythm, normal S1 and S2, no rubs or gallops. No murmur. VASCULAR: Radial and DP pulses 2+ bilaterally. No carotid bruits RESPIRATORY:  Clear to auscultation without rales, wheezing or rhonchi  ABDOMEN: Soft, non-tender, non-distended MUSCULOSKELETAL:  Ambulates independently SKIN: Warm and dry, no edema NEUROLOGIC:  Alert and oriented x 3. No focal neuro deficits noted. PSYCHIATRIC:  Normal affect    ASSESSMENT AND PLAN: .    Palpitations:  -monitor shows show rare/brief paroxysmal SVT and 6% PVC burden -tried beta blockers, concern that bloating and diarrhea may be due to this.  -rare now   Hypertension: has been well controlled at home. Had prior episodes of hypotension, monitor -continue lisinopril 10 mg daily -held beta blocker as above   History of Chest pain:  CT coronary without plaque or calcium -we previously discussed guidelines and recommendations for statin based on calcium score of 0.  -suspect noncardiac etiology of his pain, but we have reviewed red flags that need immediate medical attention  CV risk counseling and prevention -recommend heart healthy/Mediterranean diet, with whole  grains, fruits, vegetable, fish, lean meats, nuts, and olive oil. Limit salt. -recommend moderate walking, 3-5 times/week for 30-50 minutes each session. Aim for at least 150 minutes.week. Goal should be pace of 3 miles/hours, or walking 1.5 miles in 30 minutes -recommend avoidance of tobacco products. Avoid excess alcohol.  Dispo: 1 year or sooner as needed  Signed, Jodelle Red, MD   Jodelle Red, MD, PhD, [facility]  [facility]  [place]   Heart & Vascular at [facility] [address], [address] [place], [place] [address] [phone]

## 2023-08-24 NOTE — Patient Instructions (Signed)
Medication Instructions:  NO CHANGES  *If you need a refill on your cardiac medications before your next appointment, please call your pharmacy*   Lab Work: NONE If you have labs (blood work) drawn today and your tests are completely normal, you will receive your results only by: MyChart Message (if you have MyChart) OR A paper copy in the mail If you have any lab test that is abnormal or we need to change your treatment, we will call you to review the results.   Testing/Procedures: NONE   Follow-Up: At [facility], you and your health needs are our priority.  As part of our continuing mission to provide you with exceptional heart care, we have created designated Provider Care Teams.  These Care Teams include your primary Cardiologist (physician) and Advanced Practice Providers (APPs -  Physician Assistants and Nurse Practitioners) who all work together to provide you with the care you need, when you need it.  We recommend signing up for the patient portal called "MyChart".  Sign up information is provided on this After Visit Summary.  MyChart is used to connect with patients for Virtual Visits (Telemedicine).  Patients are able to view lab/test results, encounter notes, upcoming appointments, etc.  Non-urgent messages can be sent to your provider as well.   To learn more about what you can do with MyChart, go to [email or web address].    Your next appointment:   1 year(s)  Provider:   Jodelle Red, MD    Other Instructions NONE

## 2023-09-21 ENCOUNTER — Other Ambulatory Visit (HOSPITAL_BASED_OUTPATIENT_CLINIC_OR_DEPARTMENT_OTHER): Payer: Self-pay

## 2023-09-21 ENCOUNTER — Other Ambulatory Visit: Payer: Self-pay | Admitting: Cardiology

## 2023-09-21 DIAGNOSIS — I10 Essential (primary) hypertension: Secondary | ICD-10-CM

## 2023-09-21 MED ORDER — LISINOPRIL 10 MG PO TABS
10.0000 mg | ORAL_TABLET | Freq: Every day | ORAL | 3 refills | Status: DC
  Filled 2023-09-21: qty 90, 90d supply, fill #0
  Filled 2023-12-22: qty 90, 90d supply, fill #1
  Filled 2024-03-21: qty 90, 90d supply, fill #2
  Filled 2024-06-21: qty 90, 90d supply, fill #3

## 2023-10-15 ENCOUNTER — Other Ambulatory Visit (HOSPITAL_BASED_OUTPATIENT_CLINIC_OR_DEPARTMENT_OTHER): Payer: Self-pay

## 2023-12-16 ENCOUNTER — Other Ambulatory Visit (HOSPITAL_BASED_OUTPATIENT_CLINIC_OR_DEPARTMENT_OTHER): Payer: Self-pay

## 2023-12-16 MED ORDER — PREDNISONE 10 MG PO TABS
ORAL_TABLET | ORAL | 0 refills | Status: DC
  Filled 2023-12-16: qty 21, 6d supply, fill #0

## 2023-12-22 ENCOUNTER — Other Ambulatory Visit: Payer: Self-pay

## 2023-12-22 ENCOUNTER — Other Ambulatory Visit: Payer: Self-pay | Admitting: Family Medicine

## 2023-12-22 ENCOUNTER — Other Ambulatory Visit (HOSPITAL_BASED_OUTPATIENT_CLINIC_OR_DEPARTMENT_OTHER): Payer: Self-pay

## 2023-12-22 DIAGNOSIS — K219 Gastro-esophageal reflux disease without esophagitis: Secondary | ICD-10-CM

## 2023-12-22 MED ORDER — PANTOPRAZOLE SODIUM 20 MG PO TBEC
20.0000 mg | DELAYED_RELEASE_TABLET | Freq: Every day | ORAL | 1 refills | Status: DC
Start: 2023-12-22 — End: 2024-06-21
  Filled 2023-12-22: qty 90, 90d supply, fill #0
  Filled 2024-03-21: qty 90, 90d supply, fill #1

## 2024-01-25 ENCOUNTER — Other Ambulatory Visit (HOSPITAL_BASED_OUTPATIENT_CLINIC_OR_DEPARTMENT_OTHER): Payer: Self-pay

## 2024-02-02 ENCOUNTER — Encounter: Payer: Self-pay | Admitting: Family Medicine

## 2024-02-02 ENCOUNTER — Other Ambulatory Visit (HOSPITAL_BASED_OUTPATIENT_CLINIC_OR_DEPARTMENT_OTHER): Payer: Self-pay

## 2024-02-02 ENCOUNTER — Ambulatory Visit (INDEPENDENT_AMBULATORY_CARE_PROVIDER_SITE_OTHER): Payer: PRIVATE HEALTH INSURANCE

## 2024-02-02 ENCOUNTER — Ambulatory Visit: Payer: PRIVATE HEALTH INSURANCE | Admitting: Family Medicine

## 2024-02-02 VITALS — BP 134/82 | HR 85 | Temp 98.2°F | Ht 73.0 in | Wt 197.2 lb

## 2024-02-02 DIAGNOSIS — M5412 Radiculopathy, cervical region: Secondary | ICD-10-CM | POA: Diagnosis not present

## 2024-02-02 DIAGNOSIS — M5030 Other cervical disc degeneration, unspecified cervical region: Secondary | ICD-10-CM | POA: Insufficient documentation

## 2024-02-02 DIAGNOSIS — R202 Paresthesia of skin: Secondary | ICD-10-CM

## 2024-02-02 DIAGNOSIS — M7021 Olecranon bursitis, right elbow: Secondary | ICD-10-CM | POA: Insufficient documentation

## 2024-02-02 DIAGNOSIS — M47812 Spondylosis without myelopathy or radiculopathy, cervical region: Secondary | ICD-10-CM | POA: Insufficient documentation

## 2024-02-02 DIAGNOSIS — M479 Spondylosis, unspecified: Secondary | ICD-10-CM

## 2024-02-02 DIAGNOSIS — M542 Cervicalgia: Secondary | ICD-10-CM | POA: Diagnosis not present

## 2024-02-02 LAB — CBC WITH DIFFERENTIAL/PLATELET
Basophils Absolute: 0 10*3/uL (ref 0.0–0.1)
Basophils Relative: 0.6 % (ref 0.0–3.0)
Eosinophils Absolute: 0.1 10*3/uL (ref 0.0–0.7)
Eosinophils Relative: 1.5 % (ref 0.0–5.0)
HCT: 44.4 % (ref 39.0–52.0)
Hemoglobin: 15.4 g/dL (ref 13.0–17.0)
Lymphocytes Relative: 23.1 % (ref 12.0–46.0)
Lymphs Abs: 1.9 10*3/uL (ref 0.7–4.0)
MCHC: 34.7 g/dL (ref 30.0–36.0)
MCV: 91.5 fl (ref 78.0–100.0)
Monocytes Absolute: 0.6 10*3/uL (ref 0.1–1.0)
Monocytes Relative: 6.8 % (ref 3.0–12.0)
Neutro Abs: 5.5 10*3/uL (ref 1.4–7.7)
Neutrophils Relative %: 68 % (ref 43.0–77.0)
Platelets: 295 10*3/uL (ref 150.0–400.0)
RBC: 4.85 Mil/uL (ref 4.22–5.81)
RDW: 14 % (ref 11.5–15.5)
WBC: 8.1 10*3/uL (ref 4.0–10.5)

## 2024-02-02 LAB — COMPREHENSIVE METABOLIC PANEL WITH GFR
ALT: 18 U/L (ref 0–53)
AST: 19 U/L (ref 0–37)
Albumin: 4.4 g/dL (ref 3.5–5.2)
Alkaline Phosphatase: 47 U/L (ref 39–117)
BUN: 18 mg/dL (ref 6–23)
CO2: 26 meq/L (ref 19–32)
Calcium: 9.5 mg/dL (ref 8.4–10.5)
Chloride: 104 meq/L (ref 96–112)
Creatinine, Ser: 1.09 mg/dL (ref 0.40–1.50)
GFR: 71.72 mL/min (ref >60.00)
Glucose, Bld: 114 mg/dL — ABNORMAL HIGH (ref 70–99)
Potassium: 4.2 meq/L (ref 3.5–5.1)
Sodium: 137 meq/L (ref 135–145)
Total Bilirubin: 0.6 mg/dL (ref 0.2–1.2)
Total Protein: 7.5 g/dL (ref 6.0–8.3)

## 2024-02-02 MED ORDER — MELOXICAM 7.5 MG PO TABS
7.5000 mg | ORAL_TABLET | Freq: Every day | ORAL | 0 refills | Status: AC
  Filled 2024-02-02: qty 30, 30d supply, fill #0

## 2024-02-02 MED ORDER — TIZANIDINE HCL 4 MG PO TABS
4.0000 mg | ORAL_TABLET | Freq: Every evening | ORAL | 0 refills | Status: AC | PRN
Start: 2024-02-02 — End: ?
  Filled 2024-02-02: qty 30, 30d supply, fill #0

## 2024-02-02 NOTE — Progress Notes (Signed)
 Established Patient Office Visit   Subjective  Patient ID: Dustin [name], male    DOB: 09-Feb-1961  Age: 63 y.o. MRN: [id number]  Chief Complaint  Patient presents with   Neck Pain    Pt reports neck pain for 2 months. OTC -Tylenol    Patient is a 63 year old male seen for ongoing concern.  Patient endorses right-sided neck pain x 2 months.  Denies injury though is active working in [employer].  Endorses stiffness at times and neck and popping with turning head to the side.  Denies UE weakness/dropping items.  Now with a tingling/almost numb sensation in face.  Endorses continued paresthesias in bilateral UEs and LEs.  Seen by Ortho last week for R olecranon bursitis s/p drainage.  Fluid of right elbow has since return.  Patient states symptoms due to a bone spur and tennis elbow.  Was advised if required redrainage x 3 in the next few months would benefit from surgery to remove bone spur.    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   Essential hypertension 01/09/2016   Renal colic 01/09/2016   Impaired glucose tolerance 01/09/2016   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   Past Surgical History:  Procedure Laterality Date   CHOLECYSTECTOMY  2003   UMBILICAL HERNIA REPAIR  2003   WISDOM TOOTH EXTRACTION     Social History   Tobacco Use   Smoking status: Never   Smokeless tobacco: Never  Substance Use Topics   Alcohol use: Yes    Comment: occ beer   Drug use: No   Family History  Problem Relation  Age of Onset   Hypertension Mother    Cancer Sister        melanoma   CAD Father        CABG at age 40-60   Heart disease Sister        heart attack 11/2010 - stents placed at age 65-60   Colon cancer Neg Hx    Colon polyps Neg Hx    Rectal cancer Neg Hx    Stomach cancer Neg Hx    Allergies  Allergen Reactions   Novocain [Procaine] Swelling      ROS Negative unless stated above    Objective:     BP 134/82   Pulse 85   Temp 98.2 F (36.8 C)   Ht 6\' 1"  (1.854 m)   Wt 197 lb 4 oz (89.5 kg)   SpO2 97%   BMI 26.02 kg/m  BP Readings from Last 3 Encounters:  02/02/24 134/82  08/24/23 128/68  06/15/23 128/82   Wt Readings from Last 3 Encounters:  02/02/24 197 lb 4 oz (89.5 kg)  08/24/23 203 lb (92.1 kg)  06/15/23 198 lb 3.2 oz (89.9 kg)      Physical Exam Constitutional:      Appearance: Normal appearance.  HENT:     Head: Normocephalic and atraumatic.     Nose: Nose normal.  Eyes:     Extraocular Movements: Extraocular movements intact.     Conjunctiva/sclera: Conjunctivae normal.  Pupils: Pupils are equal, round, and reactive to light.  Neck:     Thyroid: No thyromegaly.      Comments: TTP of R posterior neck at base of occiput lateral to right shoulder. Musculoskeletal:     Right elbow: Effusion present. Normal range of motion. No tenderness.     Left elbow: Normal.     Cervical back: Normal range of motion. Crepitus present. Muscular tenderness present. Normal range of motion.     Thoracic back: Normal.     Comments: Right elbow with golf ball sized effusion and mild warmth.  Neurological:     Mental Status: He is alert.      No results found for any visits on 02/02/24.    Assessment & Plan:  Cervical radiculopathy -     DG Cervical Spine Complete -     Vitamin B12 -     Folate  Cervicalgia -     DG Cervical Spine Complete -     tiZANidine HCl; Take 1 tablet (4 mg total) by mouth at bedtime as needed for muscle spasms.  Dispense: 30  tablet; Refill: 0 -     Meloxicam; Take 1 tablet (7.5 mg total) by mouth daily.  Dispense: 30 tablet; Refill: 0 -     Comprehensive metabolic panel with GFR  Paresthesias -     DG Cervical Spine Complete -     Vitamin B12 -     CBC with Differential/Platelet -     Folate -     Comprehensive metabolic panel with GFR  Olecranon bursitis of right elbow -     CBC with Differential/Platelet  Pt with chronic right sided neck pain, crepitus, and paresthesias x 2 months.  Concern for cervical radiculopathy due to possible bone spur, arthritis, or disc herniation.  Obtain x-ray.  Will also obtain labs to evaluate for vitamin or electrolyte deficiencies that may be contributing to paresthesias.  Will place referral to Ortho or order additional imaging if needed based on results.  effusion of right elbow due to olecranon bursitis returned status post drainage last week by Ortho.  Effusion large enough to drain again.  Patient wishes to wait at this time.  Discussed decreasing activity when possible.  Consider support sleeve if has re-drained.  Given precautions.  Return if symptoms worsen or fail to improve.   [name], MD

## 2024-02-03 LAB — FOLATE: Folate: >25.2 ng/mL (ref >5.9)

## 2024-02-03 LAB — VITAMIN B12: Vitamin B-12: 918 pg/mL — ABNORMAL HIGH (ref 211–911)

## 2024-02-04 ENCOUNTER — Other Ambulatory Visit (HOSPITAL_BASED_OUTPATIENT_CLINIC_OR_DEPARTMENT_OTHER): Payer: Self-pay

## 2024-02-08 NOTE — Telephone Encounter (Signed)
 Okay to place referral to Ortho.  From there they can decide if injections would be appropriate or if patient would have more/additional benefit from PT.

## 2024-02-16 ENCOUNTER — Encounter: Payer: Self-pay | Admitting: Family Medicine

## 2024-03-05 ENCOUNTER — Encounter: Payer: Self-pay | Admitting: Physical Medicine and Rehabilitation

## 2024-03-05 ENCOUNTER — Ambulatory Visit (INDEPENDENT_AMBULATORY_CARE_PROVIDER_SITE_OTHER): Payer: PRIVATE HEALTH INSURANCE | Admitting: Physical Medicine and Rehabilitation

## 2024-03-05 DIAGNOSIS — M542 Cervicalgia: Secondary | ICD-10-CM

## 2024-03-05 DIAGNOSIS — M47812 Spondylosis without myelopathy or radiculopathy, cervical region: Secondary | ICD-10-CM

## 2024-03-05 DIAGNOSIS — M7918 Myalgia, other site: Secondary | ICD-10-CM

## 2024-03-05 NOTE — Progress Notes (Unsigned)
 Dustin [name] - 63 y.o. male MRN [id number]  Date of birth: 06-Dec-1960  Office Visit Note: Visit Date: 03/05/2024 PCP: Viola Greulich, MD Referred by: Viola Greulich, MD  Subjective: Chief Complaint  Patient presents with   Neck - Numbness    stiffness   HPI: Dustin [name] is a 63 y.o. male who comes in today per the request of Dr. Cinda Craze for evaluation of chronic right sided upper back stiffness radiating to neck and up to head. Symptoms ongoing for 4 months. Symptoms worsen after sleeping on right side. He describes his symptoms as stiff and tight sensation, currently denies pain. Some relief of pain with home exercise regimen, hot shower, rest and use of medications. He does attend massage therapy every 2 weeks. No history of formal physical therapy. Recent cervical radiographs show normal anatomical alignment, there is moderate C4-C5 through C6-C7 facet joint osteoarthritis, no spondylolisthesis noted. No history of cervical surgery/injections. 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 No recent trauma or falls.         Review of Systems  Musculoskeletal:  Positive for myalgias and neck pain.  Neurological:  Negative for tingling, sensory change, focal weakness and weakness.  All other systems reviewed and are negative.  Otherwise per HPI.  Assessment & Plan: Visit Diagnoses:    ICD-10-CM   1. Cervicalgia  M54.2 Ambulatory referral to Physical Therapy    2. Myofascial pain syndrome  M79.18 Ambulatory referral to Physical Therapy    3. Facet arthropathy, cervical  M47.812 Ambulatory referral to Physical Therapy       Plan: Findings:  Chronic right sided upper back pain radiating to neck and up to head. No radicular symptoms down the arms. He currently denies pain, however does experience stiffness to neck. Patient continues to have severe pain despite good conservative therapies such as massage therapy, home  exercise regimen, rest and use of medications. Patients clinical presentation and exam are consistent with myofascial pain syndrome. Multiple palpable trigger points noted to bilateral levator scapulae regions. His pain does radiate up to right occipital region. We discussed treatment plan in detail today. I placed order for course of formal physical therapy. I do think he would benefit from manual treatments and possible dry needling. Would also like him to develop directed home exercise regimen.  She can continue massage therapy as tolerated.  Should his symptoms persist at follow-up we would look at potentially looking at MRI imaging and possible medial branch blocks of the C4-5 and C6-7 facet joints corresponding to the x-ray.   We will see him back in 8 weeks. No red flag symptoms noted upon exam today.     Meds & Orders: No orders of the defined types were placed in this encounter.   Orders Placed This Encounter  Procedures   Ambulatory referral to Physical Therapy    Follow-up: Return for 8 week follow up post PT.   Procedures: No procedures performed      Clinical History: Narrative & Impression CLINICAL DATA:  Right-sided neck pain, popping, and paresthesias for 2 months.   EXAM: CERVICAL SPINE - COMPLETE 4+ VIEW   COMPARISON:  None Available.   FINDINGS: Normal frontal and sagittal alignment. The atlantodens interval is intact. Vertebral body heights are maintained. Mild C6-7 disc space narrowing and anterior endplate spurring.   Moderate C4-5 through C6-7 facet joint sclerosis and hypertrophy.   On oblique view there is a least mild bilateral C4-5  through C6-7 neuroforaminal narrowing.   No prevertebral soft tissue swelling.  The lung apices are clear.   IMPRESSION: 1. Mild C6-7 degenerative disc and endplate changes. 2. Moderate C4-5 through C6-7 facet joint osteoarthritis.     Electronically Signed   By: Bertina Broccoli M.D.   On: 02/02/2024 11:05   He reports  that he has never smoked. He has never used smokeless tobacco.  Recent Labs    05/06/23 1126  HGBA1C 5.5    Objective:  VS:  HT:    WT:   BMI:     BP:   HR: bpm  TEMP: ( )  RESP:  Physical Exam Vitals and nursing note reviewed.  HENT:     Head: Normocephalic and atraumatic.     Right Ear: External ear normal.     Left Ear: External ear normal.     Nose: Nose normal.     Mouth/Throat:     Mouth: Mucous membranes are moist.  Eyes:     Extraocular Movements: Extraocular movements intact.  Cardiovascular:     Rate and Rhythm: Normal rate.     Pulses: Normal pulses.  Pulmonary:     Effort: Pulmonary effort is normal.  Abdominal:     General: Abdomen is flat. There is no distension.  Musculoskeletal:        General: Tenderness present.     Cervical back: Tenderness present.     Comments: Discomfort noted with side-to-side rotation. Patient has good strength in the upper extremities including 5 out of 5 strength in wrist extension, long finger flexion and APB. Shoulder range of motion is full bilaterally without any sign of impingement. There is no atrophy of the hands intrinsically. Sensation intact bilaterally. Myofascial tenderness noted to bilateral levator scapulae regions upon palpation. Negative Hoffman's sign. Negative Spurling's sign.     Skin:    General: Skin is warm and dry.     Capillary Refill: Capillary refill takes less than 2 seconds.  Neurological:     General: No focal deficit present.     Mental Status: He is alert and oriented to person, place, and time.  Psychiatric:        Mood and Affect: Mood normal.        Behavior: Behavior normal.     Ortho Exam  Imaging: No results found.  Past Medical/Family/Surgical/Social History: Medications & Allergies reviewed per EMR, new medications updated. 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 02/02/2024   Degenerative disc disease, cervical  02/02/2024   Olecranon bursitis of right elbow 02/02/2024   Paresthesias 02/02/2024   Cervicalgia 02/02/2024   PVC (premature ventricular contraction) 10/03/2019   Paroxysmal SVT (supraventricular tachycardia) (HCC) 10/03/2019   History of chest pain 08/27/2019   Palpitation 08/27/2019   Elevated blood pressure reading 08/27/2019   Sinusitis 12/12/2017   Bilateral sensorineural hearing loss 11/15/2017   Subjective tinnitus, bilateral 11/15/2017   Essential hypertension 01/09/2016   Renal colic 01/09/2016   Impaired glucose tolerance 01/09/2016   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   Family History  Problem Relation Age of Onset   Hypertension Mother    Cancer Sister        melanoma   CAD Father        CABG at age 58-60   Heart disease  Sister        heart attack 11/2010 - stents placed at age 73-60   Colon cancer Neg Hx    Colon polyps Neg Hx    Rectal cancer Neg Hx    Stomach cancer Neg Hx    Past Surgical History:  Procedure Laterality Date   CHOLECYSTECTOMY  2003   UMBILICAL HERNIA REPAIR  2003   WISDOM TOOTH EXTRACTION     Social History   Occupational History   Occupation: Theme park manager: [employer]  Tobacco Use   Smoking status: Never   Smokeless tobacco: Never  Substance and Sexual Activity   Alcohol use: Yes    Comment: occ beer   Drug use: No   Sexual activity: Yes

## 2024-03-05 NOTE — Progress Notes (Unsigned)
 Pain Scale   Average Pain 0 Patient he has neck stiffness x 4 months- no complaint of pain        +Driver, -BT, -Dye Allergies.

## 2024-04-30 ENCOUNTER — Ambulatory Visit (INDEPENDENT_AMBULATORY_CARE_PROVIDER_SITE_OTHER): Payer: PRIVATE HEALTH INSURANCE | Admitting: Physical Medicine and Rehabilitation

## 2024-04-30 ENCOUNTER — Encounter: Payer: Self-pay | Admitting: Physical Medicine and Rehabilitation

## 2024-04-30 DIAGNOSIS — M7918 Myalgia, other site: Secondary | ICD-10-CM | POA: Diagnosis not present

## 2024-04-30 DIAGNOSIS — M542 Cervicalgia: Secondary | ICD-10-CM | POA: Diagnosis not present

## 2024-04-30 DIAGNOSIS — M47812 Spondylosis without myelopathy or radiculopathy, cervical region: Secondary | ICD-10-CM | POA: Diagnosis not present

## 2024-04-30 NOTE — Progress Notes (Unsigned)
 Dustin [name] - 63 y.o. male MRN [id number]  Date of birth: Jan 22, 1961  Office Visit Note: Visit Date: 04/30/2024 PCP: Mercer Clotilda SAUNDERS, MD Referred by: Mercer Clotilda SAUNDERS, MD  Subjective: Chief Complaint  Patient presents with   Neck - Follow-up   HPI: Dustin [name] is a 63 y.o. male who comes in today for evaluation of chronic right sided upper back pain radiating to neck and up to head. He is here today for follow up post formal physical therapy. Pain ongoing for several months, worsens when sleeping on right side, he describes pain as sore and tight sensation, currently rates as 3 out of 10. Also reports severe pain with side to side rotation, specifically right sided rotation. Some relief of pain with home exercise regimen, heating pad and massage therapy. He recently completed short course of formal physical therapy with minimal relief of pain. Recent cervical radiographs shows moderate C4-C5 through C6-C7 facet joint osteoarthritis. No history of cervical surgery/injections. 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 No recent trauma or falls.         Review of Systems  Musculoskeletal:  Positive for myalgias and neck pain.  Neurological:  Negative for tingling, sensory change, focal weakness and weakness.  All other systems reviewed and are negative.  Otherwise per HPI.  Assessment & Plan: Visit Diagnoses:    ICD-10-CM   1. Cervicalgia  M54.2 MR CERVICAL SPINE WO CONTRAST    2. Facet arthropathy, cervical  M47.812 MR CERVICAL SPINE WO CONTRAST    3. Myofascial pain syndrome  M79.18 MR CERVICAL SPINE WO CONTRAST       Plan: Findings:  Chronic, worsening and severe right sided upper back pain radiating to neck and up to head. No radicular symptoms down the arms. Minimal relief of pain with recent regimen of formal physical therapy. He continues with home exercise regimen, rest and use of over the counter medications.  Patients clinical presentation and exam are complex. Differentials include facet mediated pain vs myofascial pain syndrome. There is moderate facet arthropathy noted at C4-C5 and C6-C7. We discussed treatment plan in detail today. Next step is to obtain cervical MRI imaging. Depending on results of imaging we discussed possibility of performing facet joint injections. I will see him back for cervical MRI review. I did provide him with isometric neck exercises to continue at home. He can also continue with massage therapy as needed. He has no questions at this time. No red flag symptoms noted upon exam today.     Meds & Orders: No orders of the defined types were placed in this encounter.   Orders Placed This Encounter  Procedures   MR CERVICAL SPINE WO CONTRAST    Follow-up: Return for Cervical MRI review.   Procedures: No procedures performed      Clinical History: Narrative & Impression CLINICAL DATA:  Right-sided neck pain, popping, and paresthesias for 2 months.   EXAM: CERVICAL SPINE - COMPLETE 4+ VIEW   COMPARISON:  None Available.   FINDINGS: Normal frontal and sagittal alignment. The atlantodens interval is intact. Vertebral body heights are maintained. Mild C6-7 disc space narrowing and anterior endplate spurring.   Moderate C4-5 through C6-7 facet joint sclerosis and hypertrophy.   On oblique view there is a least mild bilateral C4-5 through C6-7 neuroforaminal narrowing.   No prevertebral soft tissue swelling.  The lung apices are clear.   IMPRESSION: 1. Mild C6-7 degenerative disc and endplate changes. 2. Moderate  C4-5 through C6-7 facet joint osteoarthritis.     Electronically Signed   By: Tanda Lyons M.D.   On: 02/02/2024 11:05   He reports that he has never smoked. He has never used smokeless tobacco.  Recent Labs    05/06/23 1126  HGBA1C 5.5    Objective:  VS:  HT:    WT:   BMI:     BP:   HR: bpm  TEMP: ( )  RESP:  Physical Exam Vitals  and nursing note reviewed.  HENT:     Head: Normocephalic and atraumatic.     Right Ear: External ear normal.     Left Ear: External ear normal.     Nose: Nose normal.     Mouth/Throat:     Mouth: Mucous membranes are moist.   Eyes:     Extraocular Movements: Extraocular movements intact.    Cardiovascular:     Rate and Rhythm: Normal rate.     Pulses: Normal pulses.  Pulmonary:     Effort: Pulmonary effort is normal.  Abdominal:     General: Abdomen is flat. There is no distension.   Musculoskeletal:        General: Tenderness present.     Cervical back: Normal range of motion.     Comments: Discomfort noted with side-to-side rotation, specifically right sided rotation. Patient has good strength in the upper extremities including 5 out of 5 strength in wrist extension, long finger flexion and APB. Shoulder range of motion is full bilaterally without any sign of impingement. There is no atrophy of the hands intrinsically. Sensation intact bilaterally. Myofascial tenderness noted to right levator scapulae region upon palpation. Negative Hoffman's sign. Negative Spurling's sign.      Skin:    General: Skin is warm and dry.     Capillary Refill: Capillary refill takes less than 2 seconds.   Neurological:     General: No focal deficit present.     Mental Status: He is alert and oriented to person, place, and time.   Psychiatric:        Mood and Affect: Mood normal.        Behavior: Behavior normal.     Ortho Exam  Imaging: No results found.  Past Medical/Family/Surgical/Social History: Medications & Allergies reviewed per EMR, new medications updated. 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 02/02/2024   Degenerative disc disease, cervical 02/02/2024   Olecranon bursitis of right elbow 02/02/2024   Paresthesias 02/02/2024   Cervicalgia 02/02/2024   PVC (premature ventricular contraction) 10/03/2019    Paroxysmal SVT (supraventricular tachycardia) (HCC) 10/03/2019   History of chest pain 08/27/2019   Palpitation 08/27/2019   Elevated blood pressure reading 08/27/2019   Sinusitis 12/12/2017   Bilateral sensorineural hearing loss 11/15/2017   Subjective tinnitus, bilateral 11/15/2017   Essential hypertension 01/09/2016   Renal colic 01/09/2016   Impaired glucose tolerance 01/09/2016   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   Family History  Problem Relation Age of Onset   Hypertension Mother    Cancer Sister        melanoma   CAD Father        CABG at age 48-60   Heart disease Sister        heart attack 11/2010 - stents placed at age 61-60  Colon cancer Neg Hx    Colon polyps Neg Hx    Rectal cancer Neg Hx    Stomach cancer Neg Hx    Past Surgical History:  Procedure Laterality Date   CHOLECYSTECTOMY  2003   UMBILICAL HERNIA REPAIR  2003   WISDOM TOOTH EXTRACTION     Social History   Occupational History   Occupation: Theme park manager: [employer]  Tobacco Use   Smoking status: Never   Smokeless tobacco: Never  Substance and Sexual Activity   Alcohol use: Yes    Comment: occ beer   Drug use: No   Sexual activity: Yes

## 2024-04-30 NOTE — Progress Notes (Unsigned)
 Pain Scale   Average Pain 3 Patient advising he completed PT  of 4 sessions and that it really didn't help much. Patient advising his pain comes and goes, and his neck is stiff.        +Driver, -BT, -Dye Allergies.

## 2024-05-02 ENCOUNTER — Encounter: Payer: Self-pay | Admitting: Physical Medicine and Rehabilitation

## 2024-05-02 ENCOUNTER — Telehealth: Payer: Self-pay

## 2024-05-02 NOTE — Telephone Encounter (Signed)
 Patient asking to have MRI order sent to [facility], due to insurance

## 2024-05-03 NOTE — Telephone Encounter (Signed)
 Order has been changed

## 2024-05-04 ENCOUNTER — Other Ambulatory Visit: Payer: PRIVATE HEALTH INSURANCE

## 2024-05-09 ENCOUNTER — Other Ambulatory Visit (HOSPITAL_BASED_OUTPATIENT_CLINIC_OR_DEPARTMENT_OTHER): Payer: Self-pay

## 2024-05-09 ENCOUNTER — Other Ambulatory Visit: Payer: Self-pay | Admitting: Family Medicine

## 2024-05-09 DIAGNOSIS — R350 Frequency of micturition: Secondary | ICD-10-CM

## 2024-05-09 DIAGNOSIS — R300 Dysuria: Secondary | ICD-10-CM

## 2024-05-09 DIAGNOSIS — N401 Enlarged prostate with lower urinary tract symptoms: Secondary | ICD-10-CM

## 2024-05-09 MED ORDER — TAMSULOSIN HCL 0.4 MG PO CAPS
0.4000 mg | ORAL_CAPSULE | Freq: Every day | ORAL | 0 refills | Status: DC
  Filled 2024-05-09: qty 90, 90d supply, fill #0

## 2024-05-25 ENCOUNTER — Telehealth: Payer: Self-pay | Admitting: Cardiology

## 2024-05-25 ENCOUNTER — Other Ambulatory Visit (HOSPITAL_BASED_OUTPATIENT_CLINIC_OR_DEPARTMENT_OTHER): Payer: Self-pay

## 2024-05-25 DIAGNOSIS — R002 Palpitations: Secondary | ICD-10-CM

## 2024-05-25 MED ORDER — METOPROLOL TARTRATE 25 MG PO TABS
25.0000 mg | ORAL_TABLET | Freq: Two times a day (BID) | ORAL | 2 refills | Status: AC | PRN
  Filled 2024-05-25 – 2024-05-26 (×2): qty 60, 30d supply, fill #0

## 2024-05-25 NOTE — Telephone Encounter (Signed)
 Patient c/o Palpitations:  STAT if patient reporting lightheadedness, shortness of breath, or chest pain  How long have you had palpitations/irregular HR/ Afib? Are you having the symptoms now? 3-4 weeks, yes  Are you currently experiencing lightheadedness, SOB or CP? cp  Do you have a history of afib (atrial fibrillation) or irregular heart rhythm? SVT  Have you checked your BP or HR? (document readings if available): 130/78 70  Are you experiencing any other symptoms? Headache, left side of throat

## 2024-05-25 NOTE — Telephone Encounter (Signed)
 Returned call to patient.   Notes for 3-4 weeks has had more frequent palpitations associated with sensation of heart skipping. Also associated with a myriad of other symptoms. Notes sensation of head being stopped up with head hurting not quite like a headache but not lightheaded nor dizzy. No dyspnea but feels someone is pulling air out of him. Often during episodes will have to clear his throat so his phlegm will come up and often associated with coughing. Occurs more frequently when sitting down. No clear relieving factors.   He does note he is trying to stay well hydrated. He does note more stressors.   BP at home 120/70, 1387/78, 148/83 notes higher systolic BP. HR at home in 70-80s.   Discussed the following plan and he was agreeable: Mail 14 day ZIO Rx Metoprolol  Tartrate 25mg  PRN up to BID October follow up with Dr. Lonni scheduled per recall. Will schedule sooner appt if needed based on monitor results Has labs upcoming with primary care for annual physical  Reche GORMAN Finder, NP

## 2024-05-26 ENCOUNTER — Other Ambulatory Visit (HOSPITAL_BASED_OUTPATIENT_CLINIC_OR_DEPARTMENT_OTHER): Payer: Self-pay

## 2024-05-28 ENCOUNTER — Ambulatory Visit: Payer: PRIVATE HEALTH INSURANCE | Attending: Family

## 2024-05-28 DIAGNOSIS — R002 Palpitations: Secondary | ICD-10-CM

## 2024-05-28 NOTE — Progress Notes (Unsigned)
 Enrolled for [employer] to mail a ZIO XT long term holter monitor to the patients address on file.   Dr. Jodelle Red to read.

## 2024-06-07 ENCOUNTER — Ambulatory Visit (INDEPENDENT_AMBULATORY_CARE_PROVIDER_SITE_OTHER): Payer: PRIVATE HEALTH INSURANCE | Admitting: Family Medicine

## 2024-06-07 ENCOUNTER — Encounter: Payer: Self-pay | Admitting: Family Medicine

## 2024-06-07 VITALS — BP 117/78 | HR 60 | Temp 97.6°F | Ht 73.0 in | Wt 195.4 lb

## 2024-06-07 DIAGNOSIS — I471 Supraventricular tachycardia, unspecified: Secondary | ICD-10-CM

## 2024-06-07 DIAGNOSIS — Z131 Encounter for screening for diabetes mellitus: Secondary | ICD-10-CM

## 2024-06-07 DIAGNOSIS — Z13220 Encounter for screening for lipoid disorders: Secondary | ICD-10-CM | POA: Diagnosis not present

## 2024-06-07 DIAGNOSIS — I10 Essential (primary) hypertension: Secondary | ICD-10-CM

## 2024-06-07 DIAGNOSIS — Z125 Encounter for screening for malignant neoplasm of prostate: Secondary | ICD-10-CM | POA: Diagnosis not present

## 2024-06-07 DIAGNOSIS — L729 Follicular cyst of the skin and subcutaneous tissue, unspecified: Secondary | ICD-10-CM

## 2024-06-07 DIAGNOSIS — F419 Anxiety disorder, unspecified: Secondary | ICD-10-CM | POA: Diagnosis not present

## 2024-06-07 DIAGNOSIS — Z0000 Encounter for general adult medical examination without abnormal findings: Secondary | ICD-10-CM | POA: Diagnosis not present

## 2024-06-07 LAB — CBC WITH DIFFERENTIAL/PLATELET
Basophils Absolute: 0 K/uL (ref 0.0–0.1)
Basophils Relative: 0.5 % (ref 0.0–3.0)
Eosinophils Absolute: 0 K/uL (ref 0.0–0.7)
Eosinophils Relative: 0.8 % (ref 0.0–5.0)
HCT: 44.2 % (ref 39.0–52.0)
Hemoglobin: 15.1 g/dL (ref 13.0–17.0)
Lymphocytes Relative: 25.6 % (ref 12.0–46.0)
Lymphs Abs: 1.5 K/uL (ref 0.7–4.0)
MCHC: 34.1 g/dL (ref 30.0–36.0)
MCV: 90.3 fl (ref 78.0–100.0)
Monocytes Absolute: 0.4 K/uL (ref 0.1–1.0)
Monocytes Relative: 7.1 % (ref 3.0–12.0)
Neutro Abs: 3.9 K/uL (ref 1.4–7.7)
Neutrophils Relative %: 66 % (ref 43.0–77.0)
Platelets: 287 K/uL (ref 150.0–400.0)
RBC: 4.89 Mil/uL (ref 4.22–5.81)
RDW: 13.7 % (ref 11.5–15.5)
WBC: 5.9 K/uL (ref 4.0–10.5)

## 2024-06-07 LAB — LIPID PANEL
Cholesterol: 167 mg/dL (ref 0–200)
HDL: 47.4 mg/dL (ref >39.00)
LDL Cholesterol: 95 mg/dL (ref 0–99)
NonHDL: 119.23
Total CHOL/HDL Ratio: 4
Triglycerides: 122 mg/dL (ref 0.0–149.0)
VLDL: 24.4 mg/dL (ref 0.0–40.0)

## 2024-06-07 LAB — PSA: PSA: 0.52 ng/mL (ref 0.10–4.00)

## 2024-06-07 LAB — COMPREHENSIVE METABOLIC PANEL WITH GFR
ALT: 17 U/L (ref 0–53)
AST: 18 U/L (ref 0–37)
Albumin: 4.6 g/dL (ref 3.5–5.2)
Alkaline Phosphatase: 48 U/L (ref 39–117)
BUN: 15 mg/dL (ref 6–23)
CO2: 29 meq/L (ref 19–32)
Calcium: 9.7 mg/dL (ref 8.4–10.5)
Chloride: 103 meq/L (ref 96–112)
Creatinine, Ser: 1.2 mg/dL (ref 0.40–1.50)
GFR: 63.75 mL/min (ref >60.00)
Glucose, Bld: 101 mg/dL — ABNORMAL HIGH (ref 70–99)
Potassium: 4.5 meq/L (ref 3.5–5.1)
Sodium: 139 meq/L (ref 135–145)
Total Bilirubin: 0.9 mg/dL (ref 0.2–1.2)
Total Protein: 7.6 g/dL (ref 6.0–8.3)

## 2024-06-07 LAB — MAGNESIUM: Magnesium: 2.1 mg/dL (ref 1.5–2.5)

## 2024-06-07 LAB — TSH: TSH: 0.97 u[IU]/mL (ref 0.35–5.50)

## 2024-06-07 LAB — HEMOGLOBIN A1C: Hgb A1c MFr Bld: 6.1 % (ref 4.6–6.5)

## 2024-06-07 LAB — T4, FREE: Free T4: 0.85 ng/dL (ref 0.60–1.60)

## 2024-06-07 NOTE — Progress Notes (Signed)
 Established Patient Office Visit   Subjective  Patient ID: Dustin [name], male    DOB: 1961/06/19  Age: 63 y.o. MRN: [id number]  Chief Complaint  Patient presents with   Annual Exam    Pt  is a 63 yo male seen for CPE. Doing well overall.  Started having palpitaitons again.  Back on Metoprolol .  Did not take meds this am.  Had heart monitor but it did not stay on. Will be trying it again for 14 days.  Followed by Cards.  Still staying active as works in [employer].  Has a cyst on posterior R neck in hairline that drains intermittently.  Would like removed.  Patient inquires if he needs shingles vaccine.  Had shingles a few years ago.    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 02/02/2024   Degenerative disc disease, cervical 02/02/2024   Olecranon bursitis of right elbow 02/02/2024   Paresthesias 02/02/2024   Cervicalgia 02/02/2024   PVC (premature ventricular contraction) 10/03/2019   Paroxysmal SVT (supraventricular tachycardia) (HCC) 10/03/2019   History of chest pain 08/27/2019   Palpitation 08/27/2019   Elevated blood pressure reading 08/27/2019   Sinusitis 12/12/2017   Bilateral sensorineural hearing loss 11/15/2017   Subjective tinnitus, bilateral 11/15/2017   Essential hypertension 01/09/2016   Renal colic 01/09/2016   Impaired glucose tolerance 01/09/2016   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   Past Surgical History:  Procedure Laterality Date   CHOLECYSTECTOMY  2003   HERNIA REPAIR     Umbilical hernia   UMBILICAL HERNIA REPAIR  2003   WISDOM TOOTH EXTRACTION     Social History   Tobacco Use   Smoking status: Never   Smokeless tobacco: Never  Substance Use Topics   Alcohol use: Yes    Comment: occ beer   Drug use: No   Family History  Problem Relation Age of  Onset   Hypertension Mother    Stroke Mother    Cancer Sister        melanoma   CAD Father        CABG at age 62-60   Heart disease Sister        heart attack 11/2010 - stents placed at age 29-60   Colon cancer Neg Hx    Colon polyps Neg Hx    Rectal cancer Neg Hx    Stomach cancer Neg Hx    Allergies  Allergen Reactions   Novocain [Procaine] Swelling    ROS Negative unless stated above    Objective:     BP 117/78 (BP Location: Left Arm, Patient Position: Sitting, Cuff Size: Normal)   Pulse 60   Temp 97.6 F (36.4 C) (Oral)   Ht 6' 1 (1.854 m)   Wt 195 lb 6.4 oz (88.6 kg)   SpO2 98%   BMI 25.78 kg/m  BP Readings from Last 3 Encounters:  06/07/24 117/78  02/02/24 134/82  08/24/23 128/68   Wt Readings from Last 3 Encounters:  06/07/24 195 lb 6.4 oz (88.6 kg)  02/02/24 197 lb 4 oz (89.5 kg)  08/24/23 203 lb (92.1 kg)      Physical Exam Constitutional:      Appearance: Normal appearance.  HENT:     Head: Normocephalic and atraumatic.     Right Ear: Tympanic  membrane, ear canal and external ear normal.     Left Ear: Tympanic membrane, ear canal and external ear normal.     Nose: Nose normal.     Mouth/Throat:     Mouth: Mucous membranes are moist.     Pharynx: No oropharyngeal exudate or posterior oropharyngeal erythema.  Eyes:     General: No scleral icterus.    Extraocular Movements: Extraocular movements intact.     Conjunctiva/sclera: Conjunctivae normal.     Pupils: Pupils are equal, round, and reactive to light.  Neck:     Thyroid : No thyromegaly.  Cardiovascular:     Rate and Rhythm: Normal rate and regular rhythm.     Pulses: Normal pulses.     Heart sounds: Normal heart sounds. No murmur heard.    No friction rub.  Pulmonary:     Effort: Pulmonary effort is normal.     Breath sounds: Normal breath sounds. No wheezing, rhonchi or rales.  Abdominal:     General: Bowel sounds are normal.     Palpations: Abdomen is soft.     Tenderness:  There is no abdominal tenderness.  Musculoskeletal:        General: No deformity. Normal range of motion.  Lymphadenopathy:     Cervical: No cervical adenopathy.  Skin:    General: Skin is warm and dry.     Findings: Lesion present.         Comments: An 8 mm papule within hairline of posterior scalp, mildly fluctuant.  No erythema, drainage, or increased warmth.  Neurological:     General: No focal deficit present.     Mental Status: He is alert and oriented to person, place, and time.  Psychiatric:        Mood and Affect: Mood normal.        Thought Content: Thought content normal.        06/07/2024   10:39 AM 05/06/2023   10:44 AM 12/06/2022    3:24 PM  Depression screen PHQ 2/9  Decreased Interest 0 0 0  Down, Depressed, Hopeless 0 0 0  PHQ - 2 Score 0 0 0  Altered sleeping 0 0 0  Tired, decreased energy 0 0 0  Change in appetite 0 0 0  Feeling bad or failure about yourself  0 0 0  Trouble concentrating 0 0 0  Moving slowly or fidgety/restless 0 0 0  Suicidal thoughts 0 0 0  PHQ-9 Score 0 0 0      06/07/2024   10:50 AM 06/07/2024   10:39 AM 05/06/2023   10:44 AM 02/11/2021    1:29 PM  GAD 7 : Generalized Anxiety Score  Nervous, Anxious, on Edge 1 0 1 3  Control/stop worrying 0 0 0 1  Worry too much - different things 1 0 0 1  Trouble relaxing 0 0  0  Restless 0 0 0 0  Easily annoyed or irritable 1 0 0 0  Afraid - awful might happen 0 0 0 0  Total GAD 7 Score 3 0  5  Anxiety Difficulty Not difficult at all Not difficult at all  Not difficult at all     No results found for any visits on 06/07/24.    Assessment & Plan:   Well adult exam -     CBC with Differential/Platelet; Future -     Comprehensive metabolic panel with GFR; Future -     Hemoglobin A1c; Future -     Lipid panel;  Future -     TSH; Future -     T4, free; Future  Essential hypertension -     Comprehensive metabolic panel with GFR; Future -     TSH; Future -     T4, free;  Future  Anxiety -     TSH; Future -     T4, free; Future  Paroxysmal SVT (supraventricular tachycardia) (HCC) -     CBC with Differential/Platelet; Future -     Comprehensive metabolic panel with GFR; Future -     TSH; Future -     Magnesium; Future  Screening for prostate cancer -     PSA; Future  Cyst of skin  Age-appropriate health screenings discussed.  Obtain labs.  Immunizations reviewed.  Patient wishes to wait on shingles vaccine at this time given shingles infection a few years ago.  Colonoscopy done 10/28/2017.  Referral for cyst removal.  Patient previously seen by dermatology.  If new referral needed ok to place.  BP controlled continue current meds.   Continue Metoprolol  for palpitations/SVT.  Encouraged to keep follow-up with cardiology.  Anxiety stable.  GAD-7 score 3 this visit.   Return in about 1 year (around 06/07/2025) for physical.  Sooner if needed.   Clotilda JONELLE Single, MD

## 2024-06-08 ENCOUNTER — Telehealth: Payer: Self-pay | Admitting: Family

## 2024-06-08 DIAGNOSIS — R002 Palpitations: Secondary | ICD-10-CM

## 2024-06-08 NOTE — Telephone Encounter (Signed)
  1. Is this related to a heart monitor you are wearing?  (If the patient says no, please ask     if they are caling about ICD/pacemaker.) Heart Monitor  2. What is your issue?? Monitor fell off and he called the company. They told him to contact us  to send in another order.   Please route to covering RN/CMA/RMA for results. Route to monitor technicians or your monitor tech representative for your site for any technical concerns

## 2024-06-08 NOTE — Telephone Encounter (Signed)
 Spoke to patient he stated his monitor fell off last week.He only wore for 2 days.He works outside.He called monitor company and was advised to have us  place another order for another monitor to be mailed.Order placed.I will make Reche Finder NP aware.

## 2024-06-11 ENCOUNTER — Ambulatory Visit: Payer: PRIVATE HEALTH INSURANCE | Attending: Family

## 2024-06-11 DIAGNOSIS — R002 Palpitations: Secondary | ICD-10-CM

## 2024-06-11 NOTE — Progress Notes (Unsigned)
 Enrolled for [employer] to mail a ZIO XT long term holter monitor to the patients address on file.   Dr. Jodelle Red to read.

## 2024-06-11 NOTE — Telephone Encounter (Signed)
 Appreciate nursing team assistance.   Nycere Presley S Eva Griffo, NP

## 2024-06-21 ENCOUNTER — Other Ambulatory Visit: Payer: Self-pay | Admitting: Family Medicine

## 2024-06-21 ENCOUNTER — Other Ambulatory Visit: Payer: Self-pay

## 2024-06-21 DIAGNOSIS — K219 Gastro-esophageal reflux disease without esophagitis: Secondary | ICD-10-CM

## 2024-06-22 ENCOUNTER — Ambulatory Visit: Payer: Self-pay | Admitting: Family Medicine

## 2024-06-22 ENCOUNTER — Other Ambulatory Visit (HOSPITAL_BASED_OUTPATIENT_CLINIC_OR_DEPARTMENT_OTHER): Payer: Self-pay

## 2024-06-22 MED ORDER — PANTOPRAZOLE SODIUM 20 MG PO TBEC
20.0000 mg | DELAYED_RELEASE_TABLET | Freq: Every day | ORAL | 1 refills | Status: AC
  Filled 2024-06-22: qty 90, 90d supply, fill #0
  Filled 2024-09-19: qty 90, 90d supply, fill #1

## 2024-07-30 ENCOUNTER — Ambulatory Visit: Payer: Self-pay

## 2024-07-30 NOTE — Telephone Encounter (Signed)
 FYI Only or Action Required?: FYI only for provider.  Patient was last seen in primary care on 06/07/2024 by Mercer Clotilda SAUNDERS, MD.  Called Nurse Triage reporting Cough.  Symptoms began 2-3 weeks ago.  Symptoms are: gradually worsening.  Triage Disposition: See PCP When Office is Open (Within 3 Days)  Patient/caregiver understands and will follow disposition?: Yes       Copied from CRM #[id number]. Topic: Clinical - Red Word Triage >> Jul 30, 2024  4:39 PM Roselie BROCKS wrote: Red Word that prompted transfer to Nurse Triage: Patient states he is coughing really bad and coughing up phlegm ,chest hurting, real bad headaches, nasal stopped up, very weak,        Reason for Disposition  Cough has been present for > 3 weeks  Answer Assessment - Initial Assessment Questions 1. ONSET: When did the cough begin?      2-3 weeks ago  2. SEVERITY: How bad is the cough today?      Mild to moderate  3. SPUTUM: Describe the color of your sputum (e.g., none, dry cough; clear, white, yellow, green)     Intermittent, mostly in the morning  4. HEMOPTYSIS: Are you coughing up any blood? If Yes, ask: How much? (e.g., flecks, streaks, tablespoons, etc.)     No 5. DIFFICULTY BREATHING: Are you having difficulty breathing? If Yes, ask: How bad is it? (e.g., mild, moderate, severe)      No 6. FEVER: Do you have a fever? If Yes, ask: What is your temperature, how was it measured, and when did it start?     No 7. CARDIAC HISTORY: Do you have any history of heart disease? (e.g., heart attack, congestive heart failure)      Yes 8. LUNG HISTORY: Do you have any history of lung disease?  (e.g., pulmonary embolus, asthma, emphysema)     No 9. PE RISK FACTORS: Do you have a history of blood clots? (or: recent major surgery, recent prolonged travel, bedridden)     No 10. OTHER SYMPTOMS: Do you have any other symptoms? (e.g., runny nose, wheezing, chest pain)       Headaches,  intermittent dizziness, nasal congestion, fatigue in the afternoon, single episode of vomiting, intermittent chest pain with cough  Protocols used: Cough - Acute Productive-A-AH

## 2024-07-31 ENCOUNTER — Ambulatory Visit (INDEPENDENT_AMBULATORY_CARE_PROVIDER_SITE_OTHER): Payer: PRIVATE HEALTH INSURANCE | Admitting: Family Medicine

## 2024-07-31 ENCOUNTER — Other Ambulatory Visit (HOSPITAL_BASED_OUTPATIENT_CLINIC_OR_DEPARTMENT_OTHER): Payer: Self-pay

## 2024-07-31 ENCOUNTER — Encounter: Payer: Self-pay | Admitting: Family Medicine

## 2024-07-31 VITALS — BP 140/76 | HR 86 | Temp 98.0°F | Wt 195.0 lb

## 2024-07-31 DIAGNOSIS — J019 Acute sinusitis, unspecified: Secondary | ICD-10-CM

## 2024-07-31 MED ORDER — AMOXICILLIN-POT CLAVULANATE 875-125 MG PO TABS
1.0000 | ORAL_TABLET | Freq: Two times a day (BID) | ORAL | 0 refills | Status: DC
  Filled 2024-07-31: qty 20, 10d supply, fill #0

## 2024-07-31 NOTE — Progress Notes (Signed)
   Subjective:    Patient ID: Dustin [name], male    DOB: 18-Mar-1961, 63 y.o.   MRN: [id number]  HPI Here for 2 weeks of head congestion, PND, and coughing up green sputum. No fever. He tested negative for flu and Covid at home.    Review of Systems  Constitutional: Negative.   HENT:  Positive for congestion, ear pain, postnasal drip and sinus pressure. Negative for sore throat.   Eyes: Negative.   Respiratory:  Positive for cough. Negative for shortness of breath and wheezing.        Objective:   Physical Exam Constitutional:      Appearance: Normal appearance.  HENT:     Right Ear: Tympanic membrane, ear canal and external ear normal.     Left Ear: Tympanic membrane, ear canal and external ear normal.     Nose: Nose normal.     Mouth/Throat:     Pharynx: Oropharynx is clear.  Eyes:     Conjunctiva/sclera: Conjunctivae normal.  Pulmonary:     Effort: Pulmonary effort is normal.     Breath sounds: Normal breath sounds.  Lymphadenopathy:     Cervical: No cervical adenopathy.  Neurological:     Mental Status: He is alert.           Assessment & Plan:  Sinusitis, treat with 10 days of Augmentin . Add Mucinex as needed.  Garnette Olmsted, MD

## 2024-08-10 ENCOUNTER — Ambulatory Visit: Payer: PRIVATE HEALTH INSURANCE | Admitting: Physical Medicine and Rehabilitation

## 2024-08-15 DIAGNOSIS — R002 Palpitations: Secondary | ICD-10-CM

## 2024-08-16 ENCOUNTER — Ambulatory Visit (HOSPITAL_BASED_OUTPATIENT_CLINIC_OR_DEPARTMENT_OTHER): Payer: Self-pay | Admitting: Family

## 2024-08-16 ENCOUNTER — Other Ambulatory Visit (HOSPITAL_BASED_OUTPATIENT_CLINIC_OR_DEPARTMENT_OTHER): Payer: Self-pay

## 2024-08-16 MED ORDER — METOPROLOL SUCCINATE ER 25 MG PO TB24
25.0000 mg | ORAL_TABLET | Freq: Every day | ORAL | 1 refills | Status: AC
  Filled 2024-08-16: qty 90, 90d supply, fill #0
  Filled 2024-11-08: qty 90, 90d supply, fill #1

## 2024-08-16 NOTE — Telephone Encounter (Signed)
-----   Message from Reche GORMAN Finder sent at 08/16/2024  8:13 AM EDT ----- Monitors with predominantly normal sinus rhythm.  Between the 2 monitors there were 176 episodes of supraventricular tachycardia which is a fast heartbeat in the top chamber of the heart.  Longest  about 20 seconds.  Early beats in the top chamber of the heart called PACs occurred 9.7% of the time.  Early beats in the bottom chamber of the heart called PVC occurred 3.8% of the time.  Recommend Metoprolol  Succinate (Toprol ) 25mg  daily. May continue to use Metoprolol  Tartrate 25mg  AS NEEDED for breakthrough palpitations. follow up as scheduled to discuss next steps.  ----- Message ----- From: Lonni Slain, MD Sent: 08/15/2024   6:09 PM EDT To: Reche GORMAN Finder, NP

## 2024-08-16 NOTE — Telephone Encounter (Signed)
 Tried reaching out to the pt to endorse monitor results and recommendations per Reche Finder, NP, and pt did not answer and phone continues to ring with no voicemail offered.   Results were also sent to the pts mychart account to review by Reche Finder, NP.    Will monitor for review and will go ahead and send in Toprol  XL 25 mg po daily to his preferred pharmacy on file.  Will message him about this.

## 2024-08-17 ENCOUNTER — Encounter (HOSPITAL_BASED_OUTPATIENT_CLINIC_OR_DEPARTMENT_OTHER): Payer: Self-pay | Admitting: Cardiology

## 2024-08-17 ENCOUNTER — Ambulatory Visit (HOSPITAL_BASED_OUTPATIENT_CLINIC_OR_DEPARTMENT_OTHER): Payer: PRIVATE HEALTH INSURANCE | Admitting: Cardiology

## 2024-08-17 VITALS — BP 138/70 | HR 83 | Ht 73.0 in | Wt 196.2 lb

## 2024-08-17 DIAGNOSIS — H8110 Benign paroxysmal vertigo, unspecified ear: Secondary | ICD-10-CM

## 2024-08-17 DIAGNOSIS — R0789 Other chest pain: Secondary | ICD-10-CM

## 2024-08-17 DIAGNOSIS — I493 Ventricular premature depolarization: Secondary | ICD-10-CM

## 2024-08-17 DIAGNOSIS — I491 Atrial premature depolarization: Secondary | ICD-10-CM

## 2024-08-17 DIAGNOSIS — I471 Supraventricular tachycardia, unspecified: Secondary | ICD-10-CM

## 2024-08-17 DIAGNOSIS — I10 Essential (primary) hypertension: Secondary | ICD-10-CM | POA: Diagnosis not present

## 2024-08-17 DIAGNOSIS — R002 Palpitations: Secondary | ICD-10-CM

## 2024-08-17 NOTE — Patient Instructions (Signed)

## 2024-08-17 NOTE — Progress Notes (Signed)
 Cardiology Office Note:  .   Date:  08/17/2024  ID:  Dustin [name], DOB 1961/07/05, MRN [id number] PCP: Mercer Clotilda SAUNDERS, MD  [facility] Providers Cardiologist:  Shelda Bruckner, MD {  History of Present Illness: .   Dustin [name] is a 63 y.o. male with a hx of palpitations/pSVT, HTN, chronic non cardiac chest pain, strong FH of CAD who is seen for follow up I met him in the hospital on 04/19/19 during an admisison for chest pain.   Cardiac history: longstanding chest pain, normal myoview  2017. Ct coronary 04/2019 during admission for chest pain with calcium  score of 0, normal cors, no evidence of CAD. PA measured 37 mm. CT angio chest/dissection done the day prior was unremarkable.  Today: Woke up this morning, moved his head rapidly from laying on his right side, had sensation of room spinning. Has never happened before. We discussed BPPV today. He was very anxious about this, blood pressure is high today but has been well controlled; a few weeks ago, BP was 100/60, but has generally been 120s systolic.  Hasn't taken metoprolol  tartrate in a few weeks as he felt palpitations were back to normal. PACs seen on ECG today. Reviewed monitor results today. Had frequent but brief SVT, frequent PACs, some PVCs. Reche Finder recommended low dose metoprolol  succinate. We discussed that this is a preventative for the ectopy rather than the tartate, which is a short acting treatment medication.   He wants to avoid medications as he generally feels ok. Walked over 5 miles at work. We discussed options for treatment, see below.  When he had a viral URI he had chest heaviness when he coughed. Has intermittent right sided chest pain and occasional sharp central chest pain unchanged from prior.  ROS: Denies shortness of breath at rest or with normal exertion. No PND, orthopnea, LE edema or unexpected weight gain. No syncope. ROS otherwise negative except as noted.   Studies Reviewed: SABRA     EKG:  EKG Interpretation Date/Time:  Friday August 17 2024 08:12:43 EDT Ventricular Rate:  83 PR Interval:  152 QRS Duration:  70 QT Interval:  366 QTC Calculation: 430 R Axis:   55  Text Interpretation: Sinus rhythm with Premature atrial complexes Confirmed by Bruckner Shelda [phone]) on 08/17/2024 8:27:33 AM    Physical Exam:   VS:  BP 138/70   Pulse 83   Ht 6' 1 (1.854 m)   Wt 196 lb 3.2 oz (89 kg)   SpO2 98%   BMI 25.89 kg/m    Wt Readings from Last 3 Encounters:  08/17/24 196 lb 3.2 oz (89 kg)  07/31/24 195 lb (88.5 kg)  06/07/24 195 lb 6.4 oz (88.6 kg)    GEN: Well nourished, well developed in no acute distress HEENT: Normal, moist mucous membranes NECK: No JVD CARDIAC: regular rhythm with frequent ectopy, normal S1 and S2, no rubs or gallops. No murmur. VASCULAR: Radial and DP pulses 2+ bilaterally. No carotid bruits RESPIRATORY:  Clear to auscultation without rales, wheezing or rhonchi  ABDOMEN: Soft, non-tender, non-distended MUSCULOSKELETAL:  Ambulates independently SKIN: Warm and dry, no edema NEUROLOGIC:  Alert and oriented x 3. No focal neuro deficits noted. PSYCHIATRIC:  Normal affect    ASSESSMENT AND PLAN: .    BPPV: -first episode this morning, reviewed this today  Palpitations pSVT PACS PVCs  -monitor shows show paroxysmal SVT, PACs and PVCs -we discussed differences between metoprolol  tartate as a PRN treatment and metoprolol  succinate as  a long term/daily preventative -after shared decision making, he will try metoprolol  succinate and see how he feels. If he feels worse on this he will let us  know. -can use extra PRN metoprolol  tartrate as needed -discussed watching for longer symptoms, discussed how afib/flutter are different but can come and stay   Hypertension: has been well controlled at home. Had prior episodes of hypotension, monitor -continue lisinopril  10 mg daily -see above re: beta blocker   History of Chest pain:  CT  coronary without plaque or calcium  -we previously discussed guidelines and recommendations for statin based on calcium  score of 0.  -likely noncardiac etiology of his pain, but we have reviewed red flags that need immediate medical attention  CV risk counseling and prevention -recommend heart healthy/Mediterranean diet, with whole grains, fruits, vegetable, fish, lean meats, nuts, and olive oil. Limit salt. -recommend moderate walking, 3-5 times/week for 30-50 minutes each session. Aim for at least 150 minutes.week. Goal should be pace of 3 miles/hours, or walking 1.5 miles in 30 minutes -recommend avoidance of tobacco products. Avoid excess alcohol.  Dispo: 1 year or sooner as needed  Signed, Shelda Bruckner, MD   Shelda Bruckner, MD, PhD, [facility]  [facility]  [place]  [facility] [address] [place], [place] [address] [phone]

## 2024-08-18 ENCOUNTER — Other Ambulatory Visit: Payer: Self-pay | Admitting: Family Medicine

## 2024-08-18 DIAGNOSIS — R350 Frequency of micturition: Secondary | ICD-10-CM

## 2024-08-18 DIAGNOSIS — N401 Enlarged prostate with lower urinary tract symptoms: Secondary | ICD-10-CM

## 2024-08-18 DIAGNOSIS — R300 Dysuria: Secondary | ICD-10-CM

## 2024-08-22 ENCOUNTER — Other Ambulatory Visit (HOSPITAL_BASED_OUTPATIENT_CLINIC_OR_DEPARTMENT_OTHER): Payer: Self-pay

## 2024-08-22 MED ORDER — TAMSULOSIN HCL 0.4 MG PO CAPS
0.4000 mg | ORAL_CAPSULE | Freq: Every day | ORAL | 0 refills | Status: DC
  Filled 2024-08-22: qty 90, 90d supply, fill #0

## 2024-08-27 ENCOUNTER — Ambulatory Visit (INDEPENDENT_AMBULATORY_CARE_PROVIDER_SITE_OTHER): Payer: PRIVATE HEALTH INSURANCE | Admitting: Physical Medicine and Rehabilitation

## 2024-08-27 ENCOUNTER — Encounter: Payer: Self-pay | Admitting: Physical Medicine and Rehabilitation

## 2024-08-27 DIAGNOSIS — M542 Cervicalgia: Secondary | ICD-10-CM

## 2024-08-27 DIAGNOSIS — M47812 Spondylosis without myelopathy or radiculopathy, cervical region: Secondary | ICD-10-CM | POA: Diagnosis not present

## 2024-08-27 NOTE — Progress Notes (Unsigned)
 Dustin [name] - 63 y.o. male MRN [id number]  Date of birth: 1961/07/15  Office Visit Note: Visit Date: 08/27/2024 PCP: Mercer Clotilda SAUNDERS, MD Referred by: Mercer Clotilda SAUNDERS, MD  Subjective: Chief Complaint  Patient presents with   Neck - Pain   HPI: Dustin [name] is a 63 y.o. male who comes in today for evaluation of chronic right sided upper back pain radiating to neck and up to head. Pain ongoing for several months. He is here today for cervical MRI review. His pain worsens when laying down to sleep. Also reports pain with side to side rotation of neck, specifically to the right. He describes pain as sore and aching sensation, currently denies pain. Some relief of pain with home exercise regimen, rest and use of medications. No relief of pain with recent course of formal physical therapy. Recent MRI of cervical spine shows severe active inflammation of right C2-C3 facet joint. He does have multi level facet arthropathy. There is severe bilateral C5-C6 foraminal stenosis, mild cord flattening at this level, no severe spinal canal stenosis noted. Patient denies focal weakness, numbness and tingling. No recent trauma or falls.      Review of Systems  Musculoskeletal:  Negative for neck pain.  Neurological:  Negative for tingling, sensory change, focal weakness and weakness.   Otherwise per HPI.  Assessment & Plan: Visit Diagnoses:    ICD-10-CM   1. Cervicalgia  M54.2     2. Facet arthropathy, cervical  M47.812        Plan: Findings:  Chronic right sided upper back pain radiating to neck and up to head. Patient is pain free today. He continues to home exercise regimen, rest and use of medications. I discussed recent cervical MRI with him today using imaging and spine model. He did bring copy of CD for us  to review. I do think his pain is most consistent with facet mediated pain at this time. His pain fits with C2-C3 referral pattern. We can look at performing diagnostic and hopefully  therapeutic right C2-C3 facet joint injection should his pain return. I discussed injection procedure in detail today, he has no questions at this time. Should his pain present as more radicular in nature would consider performing cervical epidural steroid injection. I instructed patient to contact us  if his pain worsens. I encouraged him to remain active as tolerated. No red flag symptoms noted upon exam today.     Meds & Orders: No orders of the defined types were placed in this encounter.  No orders of the defined types were placed in this encounter.   Follow-up: Return if symptoms worsen or fail to improve.   Procedures: No procedures performed      Clinical History: No specialty comments available.   He reports that he has never smoked. He has been exposed to tobacco smoke. He has never used smokeless tobacco.  Recent Labs    06/07/24 1128  HGBA1C 6.1    Objective:  VS:  HT:    WT:   BMI:     BP:   HR: bpm  TEMP: ( )  RESP:  Physical Exam Vitals and nursing note reviewed.  HENT:     Head: Normocephalic and atraumatic.     Right Ear: External ear normal.     Left Ear: External ear normal.     Nose: Nose normal.     Mouth/Throat:     Mouth: Mucous membranes are moist.  Eyes:  Extraocular Movements: Extraocular movements intact.  Cardiovascular:     Rate and Rhythm: Normal rate.     Pulses: Normal pulses.  Pulmonary:     Effort: Pulmonary effort is normal.  Abdominal:     General: Abdomen is flat. There is no distension.  Musculoskeletal:        General: Tenderness present.     Cervical back: Tenderness present.     Comments: Mild discomfort noted with side-to-side rotation, specifically right sided rotation. Patient has good strength in the upper extremities including 5 out of 5 strength in wrist extension, long finger flexion and APB. Shoulder range of motion is full bilaterally without any sign of impingement. There is no atrophy of the hands intrinsically.  Sensation intact bilaterally. Negative Hoffman's sign. Negative Spurling's sign.     Skin:    General: Skin is warm and dry.     Capillary Refill: Capillary refill takes less than 2 seconds.  Neurological:     General: No focal deficit present.     Mental Status: He is alert and oriented to person, place, and time.  Psychiatric:        Mood and Affect: Mood normal.        Behavior: Behavior normal.     Ortho Exam  Imaging: No results found.  Past Medical/Family/Surgical/Social History: Medications & Allergies reviewed per EMR, new medications updated. 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 02/02/2024   Degenerative disc disease, cervical 02/02/2024   Olecranon bursitis of right elbow 02/02/2024   Paresthesias 02/02/2024   Cervicalgia 02/02/2024   PVC (premature ventricular contraction) 10/03/2019   Paroxysmal SVT (supraventricular tachycardia) 10/03/2019   History of chest pain 08/27/2019   Palpitation 08/27/2019   Elevated blood pressure reading 08/27/2019   Sinusitis 12/12/2017   Bilateral sensorineural hearing loss 11/15/2017   Subjective tinnitus, bilateral 11/15/2017   Essential hypertension 01/09/2016   Renal colic 01/09/2016   Impaired glucose tolerance 01/09/2016   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   Family History  Problem Relation Age of Onset   Hypertension Mother    Stroke Mother    Cancer Sister        melanoma   CAD Father        CABG at age 47-60   Heart disease Sister        heart attack 11/2010 - stents placed at age 20-60   Colon cancer Neg Hx    Colon polyps Neg Hx    Rectal cancer Neg Hx    Stomach cancer Neg Hx    Past Surgical History:  Procedure Laterality Date   CHOLECYSTECTOMY  2003   HERNIA REPAIR     Umbilical hernia   UMBILICAL HERNIA REPAIR   2003   WISDOM TOOTH EXTRACTION     Social History   Occupational History   Occupation: Theme park manager: [employer]  Tobacco Use   Smoking status: Never    Passive exposure: Past   Smokeless tobacco: Never  Substance and Sexual Activity   Alcohol use: Yes    Comment: occ beer   Drug use: No   Sexual activity: Yes

## 2024-08-27 NOTE — Progress Notes (Unsigned)
 Patient says that his neck pain has been the same as it was at his last visit, although today he feels okay. He says that about a week and a half ago he had positional vertigo for the first time; his cardiologist was unsure if that was related to his neck pain. He has recently had pain on the left side of the neck, which he says is a new location.

## 2024-09-07 ENCOUNTER — Ambulatory Visit: Payer: Self-pay

## 2024-09-07 NOTE — Telephone Encounter (Signed)
 FYI Only or Action Required?: FYI only for provider: appointment scheduled on 09/10/2024 at 8 AM.  Patient was last seen in primary care on 07/31/2024 by Johnny Garnette LABOR, MD.  Called Nurse Triage reporting Dizziness.  Symptoms began several weeks ago.  Interventions attempted: Rest, hydration, or home remedies.  Symptoms are: unchanged.  Triage Disposition: See PCP When Office is Open (Within 3 Days)  Patient/caregiver understands and will follow disposition?:  Patient reports mild lightheadedness today. Was told by cardiologist that he has been having positional vertigo. No symptoms of vertigo today. Patient wanting an appointment to get a possible referral for ENT. Appointment scheduled with PCP for 09/10/2024 at 8 AM.  Copied from CRM #[id number]. Topic: Clinical - Red Word Triage >> Sep 07, 2024  8:31 AM Rea ORN wrote: Red Word that prompted transfer to Nurse Triage: 10/10 and 10/30 pt woke up with positional vertigo. Pt would like to have appt to get referral to ENT. Reason for Disposition  [1] MILD dizziness (e.g., walking normally) AND [2] has NOT been evaluated by doctor (or NP/PA) for this  (Exception: Dizziness caused by heat exposure, sudden standing, or poor fluid intake.)  Answer Assessment - Initial Assessment Questions Patient reports positional vertigo per his cardiologist on 10/10 and 10/30. Reports mild lightheadedness today. Patient is wanting an appointment to get a referral for ENT.   1. DESCRIPTION: Describe your dizziness.     Patient reports waking up with positional vertigo twice in the past month.  2. LIGHTHEADED: Do you feel lightheaded? (e.g., somewhat faint, woozy, weak upon standing)     Having some lightheadedness this morning 3. VERTIGO: Do you feel like either you or the room is spinning or tilting? (i.e., vertigo)     no 4. SEVERITY: How bad is it?  Do you feel like you are going to faint? Can you stand and walk?     mild 5. ONSET:  When did the  dizziness begin?     Started yesterday 6. AGGRAVATING FACTORS: Does anything make it worse? (e.g., standing, change in head position)     Nothing is making it worse 7. HEART RATE: Can you tell me your heart rate? How many beats in 15 seconds?  (Note: Not all patients can do this.)       NA 8. CAUSE: What do you think is causing the dizziness? (e.g., decreased fluids or food, diarrhea, emotional distress, heat exposure, new medicine, sudden standing, vomiting; unknown)     Patient was told that he has been having positional vertigo.. 9. RECURRENT SYMPTOM: Have you had dizziness before? If Yes, ask: When was the last time? What happened that time?     10/30 10. OTHER SYMPTOMS: Do you have any other symptoms? (e.g., fever, chest pain, vomiting, diarrhea, bleeding)       Diarrhea but patient states this is a common occurrence for patient.  Protocols used: Dizziness - Lightheadedness-A-AH

## 2024-09-10 ENCOUNTER — Encounter: Payer: Self-pay | Admitting: Radiology

## 2024-09-10 ENCOUNTER — Other Ambulatory Visit (HOSPITAL_BASED_OUTPATIENT_CLINIC_OR_DEPARTMENT_OTHER): Payer: Self-pay

## 2024-09-10 ENCOUNTER — Encounter: Payer: Self-pay | Admitting: Family Medicine

## 2024-09-10 ENCOUNTER — Ambulatory Visit: Payer: PRIVATE HEALTH INSURANCE | Admitting: Family Medicine

## 2024-09-10 VITALS — BP 128/80 | HR 85 | Temp 98.4°F | Ht 73.0 in | Wt 195.6 lb

## 2024-09-10 DIAGNOSIS — M5442 Lumbago with sciatica, left side: Secondary | ICD-10-CM

## 2024-09-10 DIAGNOSIS — R42 Dizziness and giddiness: Secondary | ICD-10-CM

## 2024-09-10 DIAGNOSIS — M542 Cervicalgia: Secondary | ICD-10-CM

## 2024-09-10 DIAGNOSIS — R0982 Postnasal drip: Secondary | ICD-10-CM

## 2024-09-10 DIAGNOSIS — L720 Epidermal cyst: Secondary | ICD-10-CM | POA: Diagnosis not present

## 2024-09-10 DIAGNOSIS — M5441 Lumbago with sciatica, right side: Secondary | ICD-10-CM

## 2024-09-10 MED ORDER — PREDNISONE 10 MG PO TABS
ORAL_TABLET | ORAL | 0 refills | Status: AC
  Filled 2024-09-10: qty 15, 5d supply, fill #0

## 2024-09-10 NOTE — Progress Notes (Signed)
 Established Patient Office Visit   Subjective  Patient ID: Dustin [name], male    DOB: 1961/02/08  Age: 63 y.o. MRN: [id number]  Chief Complaint  Patient presents with   Acute Visit    Right side Vertigo, headaches, right side arthritis, patient would like a ref to ENT     Pt is a 63 yo male seen for f/u.  Pt with intermittent dizziness x 1 month.  Symptoms initially started Oct 10 upon rolling over in bed after having a dream  Pt has noticed symptoms after getting a massage x 2.  In the past patient was seen by cardiology advised he had positional vertigo.  Last Thursday patient states he felt like he was walking in a fog and the right side of his head was on fire.  Endorses a band of pressure on head and behind right eye.  Some improvement in symptoms but still in a fog.  Afraid to lay on right side.  Patient endorses postnasal drainage and increased cough/having to clear her nose in the morning.  Patient also mentions bilateral low back pain flaring causing sciatica.  Tried OTC remedies and stretching without relief.  Patient is active, works in holiday representative.  Driving to [place] in [place]  regularly for work.  Plans to contact dermatology for removal of skin lesion in posterior occipital scalp.    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 02/02/2024   Degenerative disc disease, cervical 02/02/2024   Olecranon bursitis of right elbow 02/02/2024   Paresthesias 02/02/2024   Cervicalgia 02/02/2024   PVC (premature ventricular contraction) 10/03/2019   Paroxysmal SVT (supraventricular tachycardia) 10/03/2019   History of chest pain 08/27/2019   Palpitation 08/27/2019   Elevated blood pressure reading 08/27/2019   Sinusitis 12/12/2017   Bilateral sensorineural hearing loss 11/15/2017   Subjective tinnitus, bilateral 11/15/2017   Essential hypertension 01/09/2016   Renal colic 01/09/2016   Impaired glucose tolerance  01/09/2016   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   Past Surgical History:  Procedure Laterality Date   CHOLECYSTECTOMY  2003   HERNIA REPAIR     Umbilical hernia   UMBILICAL HERNIA REPAIR  2003   WISDOM TOOTH EXTRACTION     Social History   Tobacco Use   Smoking status: Never    Passive exposure: Past   Smokeless tobacco: Never  Substance Use Topics   Alcohol use: Yes    Comment: occ beer   Drug use: No   Family History  Problem Relation Age of Onset   Hypertension Mother    Stroke Mother    Cancer Sister        melanoma   CAD Father        CABG at age 52-60   Heart disease Sister        heart attack 11/2010 - stents placed at age 76-60   Colon cancer Neg Hx    Colon polyps Neg Hx    Rectal cancer Neg Hx    Stomach cancer Neg Hx    Allergies  Allergen Reactions   Novocain [Procaine] Swelling    ROS Negative unless stated above    Objective:     BP 128/80 (BP Location: Left Arm, Patient Position: Sitting, Cuff Size: Large)   Pulse 85   Temp 98.4 F (36.9  C) (Oral)   Ht 6' 1 (1.854 m)   Wt 195 lb 9.6 oz (88.7 kg)   SpO2 98%   BMI 25.81 kg/m  BP Readings from Last 3 Encounters:  09/10/24 128/80  08/17/24 138/70  07/31/24 (!) 140/76   Wt Readings from Last 3 Encounters:  09/10/24 195 lb 9.6 oz (88.7 kg)  08/17/24 196 lb 3.2 oz (89 kg)  07/31/24 195 lb (88.5 kg)      Physical Exam Constitutional:      General: He is not in acute distress.    Appearance: Normal appearance.  HENT:     Head: Normocephalic and atraumatic.     Nose: Nose normal.     Mouth/Throat:     Mouth: Mucous membranes are moist.     Pharynx: Postnasal drip present.  Cardiovascular:     Rate and Rhythm: Normal rate and regular rhythm.     Heart sounds: Normal heart sounds. No murmur heard.    No gallop.  Pulmonary:     Effort: Pulmonary effort  is normal. No respiratory distress.     Breath sounds: Normal breath sounds. No wheezing, rhonchi or rales.  Musculoskeletal:     Cervical back: Normal.     Thoracic back: Normal.     Lumbar back: Normal.  Skin:    General: Skin is warm and dry.     Comments: 2 cyst on posterior R occipial area in hairline coalescing into one mass.  Dark central punctum noted.  Neurological:     Mental Status: He is alert and oriented to person, place, and time.        09/10/2024    9:00 AM 06/07/2024   10:39 AM 05/06/2023   10:44 AM  Depression screen PHQ 2/9  Decreased Interest 0 0 0  Down, Depressed, Hopeless 0 0 0  PHQ - 2 Score 0 0 0  Altered sleeping 0 0 0  Tired, decreased energy 1 0 0  Change in appetite 0 0 0  Feeling bad or failure about yourself  0 0 0  Trouble concentrating 0 0 0  Moving slowly or fidgety/restless 0 0 0  Suicidal thoughts 0 0 0  PHQ-9 Score 1  0  0   Difficult doing work/chores Not difficult at all       Data saved with a previous flowsheet row definition      09/10/2024    9:00 AM 06/07/2024   10:50 AM 06/07/2024   10:39 AM 05/06/2023   10:44 AM  GAD 7 : Generalized Anxiety Score  Nervous, Anxious, on Edge 1 1 0 1  Control/stop worrying 0 0 0 0  Worry too much - different things 0 1 0 0  Trouble relaxing 0 0 0   Restless 0 0 0 0  Easily annoyed or irritable 0 1 0 0  Afraid - awful might happen 0 0 0 0  Total GAD 7 Score 1 3 0   Anxiety Difficulty Not difficult at all Not difficult at all Not difficult at all      No results found for any visits on 09/10/24.    Assessment & Plan:   Vertigo -     Ambulatory referral to ENT  Acute bilateral low back pain with bilateral sciatica -     predniSONE ; Take 5 tablets (50 mg total) by mouth daily for 1 day, THEN 4 tablets (40 mg total) daily for 1 day, THEN 3 tablets (30 mg total) daily for 1 day, THEN  2 tablets (20 mg total) daily for 1 day, THEN 1 tablet (10 mg total) daily for 1 day.  Dispense: 15 tablet;  Refill: 0  Cervicalgia -     Ambulatory referral to ENT  Epidermoid cyst of skin of scalp  Post-nasal drainage  Acute dizziness concerning for vertigo.  Symptoms may also be triggered by arthritis and cervical spine as well as position.  Given continued/frequent symptoms refer to ENT.  Consider OTC natural eardrops for vertigo.  Patient advised to take his time when changing positions and to stay hydrated.  Consider OTC antihistamine for sinus related symptoms causing increased postnasal drainage and possible headache.  Prednisone  taper for acute bilateral back pain with sciatica.  Also discussed continuing supportive care including stretching, heat, massage, topical analgesics, Tylenol  or NSAIDs as needed.  Patient to contact dermatology for removal of cyst and posterior right occipital scalp.  Return if symptoms worsen or fail to improve.   Clotilda JONELLE Single, MD

## 2024-09-19 ENCOUNTER — Other Ambulatory Visit: Payer: Self-pay | Admitting: Cardiology

## 2024-09-19 ENCOUNTER — Other Ambulatory Visit: Payer: Self-pay

## 2024-09-19 DIAGNOSIS — I10 Essential (primary) hypertension: Secondary | ICD-10-CM

## 2024-09-20 ENCOUNTER — Other Ambulatory Visit (HOSPITAL_BASED_OUTPATIENT_CLINIC_OR_DEPARTMENT_OTHER): Payer: Self-pay

## 2024-09-21 ENCOUNTER — Other Ambulatory Visit (HOSPITAL_BASED_OUTPATIENT_CLINIC_OR_DEPARTMENT_OTHER): Payer: Self-pay

## 2024-09-21 MED ORDER — LISINOPRIL 10 MG PO TABS
10.0000 mg | ORAL_TABLET | Freq: Every day | ORAL | 3 refills | Status: AC
  Filled 2024-09-21: qty 90, 90d supply, fill #0

## 2024-09-27 ENCOUNTER — Ambulatory Visit (INDEPENDENT_AMBULATORY_CARE_PROVIDER_SITE_OTHER): Payer: PRIVATE HEALTH INSURANCE | Admitting: Adult Health

## 2024-09-27 ENCOUNTER — Encounter: Payer: Self-pay | Admitting: Adult Health

## 2024-09-27 ENCOUNTER — Other Ambulatory Visit (HOSPITAL_BASED_OUTPATIENT_CLINIC_OR_DEPARTMENT_OTHER): Payer: Self-pay

## 2024-09-27 VITALS — BP 110/60 | HR 91 | Temp 98.2°F | Ht 73.0 in | Wt 194.0 lb

## 2024-09-27 DIAGNOSIS — H8113 Benign paroxysmal vertigo, bilateral: Secondary | ICD-10-CM

## 2024-09-27 MED ORDER — MECLIZINE HCL 50 MG PO TABS
50.0000 mg | ORAL_TABLET | Freq: Three times a day (TID) | ORAL | 0 refills | Status: DC | PRN
  Filled 2024-09-27: qty 30, 10d supply, fill #0

## 2024-09-27 MED ORDER — MECLIZINE HCL 25 MG PO TABS
50.0000 mg | ORAL_TABLET | Freq: Three times a day (TID) | ORAL | 0 refills | Status: AC | PRN
  Filled 2024-09-27: qty 60, 10d supply, fill #0

## 2024-09-27 NOTE — Progress Notes (Signed)
 Subjective:    Patient ID: Dustin [name], male    DOB: Jun 17, 1961, 63 y.o.   MRN: [id number]  HPI Discussed the use of AI scribe software for clinical note transcription with the patient, who gave verbal consent to proceed.  History of Present Illness   Dustin [name] is a 63 year old male who presents with recurrent episodes of positional vertigo.   He reports that in the last 4-5 weeks he has had three episodes of Vertigo. Today he woke up feeling dizzy  like the world was spinning around me.  He experiences vertigo episodes typically after a massage, characterized by dizziness, a sensation of the room spinning, and difficulty walking. He feels head pressure and a desire to sleep during these episodes. He has been able to get rid of his dizziness with the Epley maneuver but did not do it this morning. He plans to perform the Epley maneuver later with assistance from his wife.   He associates vertigo with dehydration, noting insufficient water intake, especially after massages, and consuming a salty meal before the episode.        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   Social Drivers of Research Scientist (physical Sciences) Strain: Not on file  Food Insecurity: Not on file  Transportation Needs: Not on file  Physical Activity: Not on file  Stress: Not on file  Social Connections: Unknown (12/14/2022)   Received from [facility]   Social Network    Social Network: Not on file  Intimate Partner Violence: Unknown (12/14/2022)   Received from [facility]   HITS    Physically Hurt: Not on file    Insult or Talk Down To: Not on file    Threaten Physical Harm: Not on file    Scream or Curse: Not on file    Past Surgical History:  Procedure Laterality Date   CHOLECYSTECTOMY  2003   HERNIA REPAIR     Umbilical hernia   UMBILICAL HERNIA REPAIR  2003   WISDOM TOOTH EXTRACTION      Family History  Problem Relation Age of Onset   Hypertension Mother    Stroke Mother    Cancer Sister        melanoma   CAD Father        CABG at age 55-60   Heart disease Sister        heart attack 11/2010 - stents placed at age 42-60   Colon cancer Neg Hx    Colon polyps Neg Hx    Rectal cancer Neg Hx  Stomach cancer Neg Hx     Allergies  Allergen Reactions   Novocain [Procaine] Swelling    Current Outpatient Medications on File Prior to Visit  Medication Sig Dispense Refill   acetaminophen  (TYLENOL ) 500 MG tablet Take 1,000 mg by mouth every 6 (six) hours as needed for headache (pain).     Apple Cider Vinegar-Ginger 500-5 MG CHEW Chew by mouth.     aspirin  EC 81 MG tablet Take 81 mg by mouth daily. Swallow whole.     co-enzyme Q-10 30 MG capsule Take 100 mg by mouth daily.     famotidine  (PEPCID ) 20 MG tablet Take 1 tablet (20 mg total) by mouth daily as needed. 30 tablet 3   lisinopril  (ZESTRIL ) 10 MG tablet Take 1 tablet (10 mg total) by mouth daily. 90 tablet 3   Magnesium 100 MG CAPS Take 100 mg by mouth daily.     meloxicam  (MOBIC ) 7.5 MG tablet Take 1 tablet (7.5 mg total) by mouth daily. 30 tablet 0   metoprolol  succinate (TOPROL  XL) 25 MG 24 hr tablet Take 1 tablet (25 mg total) by mouth  daily. 90 tablet 1   metoprolol  tartrate (LOPRESSOR ) 25 MG tablet Take 1 tablet (25 mg total) by mouth 2 (two) times daily as needed (palpitations). 60 tablet 2   Multiple Vitamin (MULTIVITAMIN WITH MINERALS) TABS tablet Take 1 tablet by mouth daily.     Multiple Vitamins-Minerals (VITAMIN D3 COMPLETE PO) Take 1 tablet by mouth daily.     Omega-3 Fatty Acids (FISH OIL) 1200 MG CAPS Take by mouth.     ondansetron  (ZOFRAN -ODT) 4 MG disintegrating tablet Take 1 tablet (4 mg total) by mouth every 8 (eight) hours as needed for nausea or vomiting. 30 tablet 1   pantoprazole  (PROTONIX ) 20 MG tablet Take 1 tablet (20 mg total) by mouth daily. 90 tablet 1   tamsulosin  (FLOMAX ) 0.4 MG CAPS capsule Take 1 capsule (0.4 mg total) by mouth daily. 90 capsule 0   tiZANidine  (ZANAFLEX ) 4 MG tablet Take 1 tablet (4 mg total) by mouth at bedtime as needed for muscle spasms. 30 tablet 0   zinc gluconate 50 MG tablet Take 50 mg by mouth daily.     amoxicillin -clavulanate (AUGMENTIN ) 875-125 MG tablet Take 1 tablet by mouth 2 (two) times daily. 20 tablet 0   No current facility-administered medications on file prior to visit.    BP 110/60   Pulse 91   Temp 98.2 F (36.8 C) (Oral)   Ht 6' 1 (1.854 m)   Wt 194 lb (88 kg)   SpO2 97%   BMI 25.60 kg/m       Objective:   Physical Exam Vitals and nursing note reviewed.  Constitutional:      Appearance: Normal appearance. He is well-developed.  HENT:     Right Ear: A middle ear effusion is present. Tympanic membrane is not erythematous or bulging.     Left Ear:  No middle ear effusion. Tympanic membrane is not erythematous or bulging.     Nose: No congestion or rhinorrhea.     Right Turbinates: Not enlarged or swollen.     Left Turbinates: Not enlarged or swollen.     Mouth/Throat:     Lips: Pink.     Mouth: Mucous membranes are moist.  Eyes:     Extraocular Movements:     Right eye: Nystagmus (horizontal) present.     Left eye: Nystagmus (horizontal)  present.  Cardiovascular:  Rate and Rhythm: Regular rhythm.     Pulses: Normal pulses.     Heart sounds: Normal heart sounds.  Pulmonary:     Effort: Pulmonary effort is normal.     Breath sounds: Normal breath sounds.  Musculoskeletal:        General: Normal range of motion.  Skin:    General: Skin is warm and dry.  Neurological:     General: No focal deficit present.     Mental Status: He is alert and oriented to person, place, and time.     Cranial Nerves: No cranial nerve deficit or facial asymmetry.     Sensory: Sensation is intact.     Motor: Motor function is intact.     Coordination: Coordination is intact.     Gait: Gait and tandem walk normal.  Psychiatric:        Mood and Affect: Mood normal.        Behavior: Behavior normal.        Thought Content: Thought content normal.        Judgment: Judgment normal.        Assessment & Plan:  Assessment and Plan    Benign paroxysmal positional vertigo Recurrent positional vertigo episodes over 4-5 weeks. Discussed meclizine  and Epley maneuver. Emphasized hydration. - Prescribed meclizine , advised nighttime use to monitor drowsiness. - Advised Epley maneuver at home. - Encouraged increased hydration. - Continue ENT follow-up on December 3rd.      Jenaro Souder, NP  I personally spent a total of 30 minutes in the care of the patient today including preparing to see the patient, getting/reviewing separately obtained history, performing a medically appropriate exam/evaluation, counseling and educating, placing orders, and documenting clinical information in the EHR.

## 2024-10-05 ENCOUNTER — Emergency Department (HOSPITAL_BASED_OUTPATIENT_CLINIC_OR_DEPARTMENT_OTHER)
Admission: EM | Admit: 2024-10-05 | Discharge: 2024-10-06 | Disposition: A | Discharge status: Home / Self Care | Payer: PRIVATE HEALTH INSURANCE | Attending: Emergency Medicine | Admitting: Emergency Medicine

## 2024-10-05 ENCOUNTER — Other Ambulatory Visit: Payer: Self-pay

## 2024-10-05 ENCOUNTER — Encounter (HOSPITAL_BASED_OUTPATIENT_CLINIC_OR_DEPARTMENT_OTHER): Payer: Self-pay | Admitting: Emergency Medicine

## 2024-10-05 ENCOUNTER — Emergency Department (HOSPITAL_BASED_OUTPATIENT_CLINIC_OR_DEPARTMENT_OTHER): Payer: PRIVATE HEALTH INSURANCE

## 2024-10-05 ENCOUNTER — Emergency Department (HOSPITAL_BASED_OUTPATIENT_CLINIC_OR_DEPARTMENT_OTHER): Payer: PRIVATE HEALTH INSURANCE | Admitting: Radiology

## 2024-10-05 DIAGNOSIS — I10 Essential (primary) hypertension: Secondary | ICD-10-CM | POA: Insufficient documentation

## 2024-10-05 DIAGNOSIS — G8929 Other chronic pain: Secondary | ICD-10-CM | POA: Insufficient documentation

## 2024-10-05 DIAGNOSIS — Z7982 Long term (current) use of aspirin: Secondary | ICD-10-CM | POA: Insufficient documentation

## 2024-10-05 DIAGNOSIS — R079 Chest pain, unspecified: Secondary | ICD-10-CM | POA: Insufficient documentation

## 2024-10-05 DIAGNOSIS — Z79899 Other long term (current) drug therapy: Secondary | ICD-10-CM | POA: Insufficient documentation

## 2024-10-05 DIAGNOSIS — R519 Headache, unspecified: Secondary | ICD-10-CM | POA: Insufficient documentation

## 2024-10-05 DIAGNOSIS — R197 Diarrhea, unspecified: Secondary | ICD-10-CM | POA: Insufficient documentation

## 2024-10-05 DIAGNOSIS — H538 Other visual disturbances: Secondary | ICD-10-CM | POA: Diagnosis not present

## 2024-10-05 DIAGNOSIS — R42 Dizziness and giddiness: Secondary | ICD-10-CM | POA: Diagnosis not present

## 2024-10-05 LAB — CBC
HCT: 42.4 % (ref 39.0–52.0)
Hemoglobin: 14.9 g/dL (ref 13.0–17.0)
MCH: 32 pg (ref 26.0–34.0)
MCHC: 35.1 g/dL (ref 30.0–36.0)
MCV: 91 fL (ref 80.0–100.0)
Platelets: 273 K/uL (ref 150–400)
RBC: 4.66 MIL/uL (ref 4.22–5.81)
RDW: 12.4 % (ref 11.5–15.5)
WBC: 7.4 K/uL (ref 4.0–10.5)
nRBC: 0 % (ref 0.0–0.2)

## 2024-10-05 LAB — BASIC METABOLIC PANEL WITH GFR
Anion gap: 12 (ref 5–15)
BUN: 17 mg/dL (ref 8–23)
CO2: 26 mmol/L (ref 22–32)
Calcium: 10 mg/dL (ref 8.9–10.3)
Chloride: 102 mmol/L (ref 98–111)
Creatinine, Ser: 1.29 mg/dL — ABNORMAL HIGH (ref 0.61–1.24)
GFR, Estimated: >60 mL/min (ref >60)
Glucose, Bld: 144 mg/dL — ABNORMAL HIGH (ref 70–99)
Potassium: 4.4 mmol/L (ref 3.5–5.1)
Sodium: 141 mmol/L (ref 135–145)

## 2024-10-05 LAB — D-DIMER, QUANTITATIVE: D-Dimer, Quant: 0.38 ug{FEU}/mL (ref 0.00–0.50)

## 2024-10-05 LAB — TROPONIN T, HIGH SENSITIVITY
Troponin T High Sensitivity: <15 ng/L (ref 0–19)
Troponin T High Sensitivity: <15 ng/L (ref 0–19)

## 2024-10-05 MED ORDER — SODIUM CHLORIDE 0.9 % IV BOLUS
1000.0000 mL | Freq: Once | INTRAVENOUS | Status: AC
  Administered 2024-10-05: 1000 mL via INTRAVENOUS

## 2024-10-05 NOTE — Discharge Instructions (Signed)
 Your workup today does not show any evidence of a heart attack or blood clot.  You need to follow-up closely with your primary care provider.  Return to the ER for any new or worsening symptoms.

## 2024-10-05 NOTE — ED Provider Notes (Signed)
  Physical Exam  BP 125/80   Pulse 61   Temp 97.9 F (36.6 C) (Oral)   Resp 15   Ht 6' 1 (1.854 m)   Wt 88 kg   SpO2 98%   BMI 25.60 kg/m   Physical Exam  Procedures  Procedures  ED Course / MDM    Medical Decision Making Amount and/or Complexity of Data Reviewed Labs: ordered. Radiology: ordered.   63 year old male presenting to the emergency department with multiple complaints, primarily complaining of chest pain for the past month.  Workup overall reassuring.  Had been having occipital headaches for prolonged period of time as well with occasional positional vertigo.  No active vertigo currently.  Chest pain workup reassuring, pending CT of the head to rule out mass.  If CT negative, can be discharged.  CT head: ***  On reassessment, ***

## 2024-10-05 NOTE — ED Provider Notes (Signed)
 [place] EMERGENCY DEPARTMENT AT [facility] Provider Note   CSN: [id number] Arrival date & time: 10/05/24  1737     Patient presents with: Chest Pain   Dustin [name] is a 63 y.o. male.   HPI 63 year old male presents with 2 chief complaints.  He is having chest pain as well as headache.  Patient states the chest pain has been all week but got a lot worse today.  Nothing specific makes it occur.  His pain across his entire chest.  Exertion/physical activity makes the pain better.  No shortness of breath, vomiting, radiation of the pain.  He is also been having a headache for the past 5-6 weeks.  Is in his occipital area.  He has also felt like his right eye has been heavy with maybe some blurred vision over the past 6 weeks as well.  He has been having a couple episodes of vertigo but none in the last couple weeks.  He denies any recent leg swelling but did travel back and forth to [place]  yesterday.  He has a history of hypertension on medicines but denies hyperlipidemia, diabetes, smoking.  He's also been having numerous episodes of diarrhea over the past 24 hours.  Prior to Admission medications   Medication Sig Start Date End Date Taking? Authorizing Provider  acetaminophen  (TYLENOL ) 500 MG tablet Take 1,000 mg by mouth every 6 (six) hours as needed for headache (pain).    [provider]  Apple Cider Vinegar-Ginger 500-5 MG CHEW Chew by mouth.    [provider]  aspirin  EC 81 MG tablet Take 81 mg by mouth daily. Swallow whole.    [provider]  co-enzyme Q-10 30 MG capsule Take 100 mg by mouth daily.    [provider]  famotidine  (PEPCID ) 20 MG tablet Take 1 tablet (20 mg total) by mouth daily as needed. 05/17/22   Armbruster, Elspeth SQUIBB, MD  lisinopril  (ZESTRIL ) 10 MG tablet Take 1 tablet (10 mg total) by mouth daily. 09/21/24   Lonni Slain, MD  Magnesium 100 MG CAPS Take 100 mg by mouth daily.    [provider]   meclizine  (ANTIVERT ) 25 MG tablet Take 2 tablets (50 mg total) by mouth 3 (three) times daily as needed. 09/27/24   Nafziger, Darleene, NP  meloxicam  (MOBIC ) 7.5 MG tablet Take 1 tablet (7.5 mg total) by mouth daily. 02/02/24   Mercer Clotilda SAUNDERS, MD  metoprolol  succinate (TOPROL  XL) 25 MG 24 hr tablet Take 1 tablet (25 mg total) by mouth daily. 08/16/24   Vannie Reche RAMAN, NP  metoprolol  tartrate (LOPRESSOR ) 25 MG tablet Take 1 tablet (25 mg total) by mouth 2 (two) times daily as needed (palpitations). 05/25/24 09/27/24  Walker, Caitlin S, NP  Multiple Vitamin (MULTIVITAMIN WITH MINERALS) TABS tablet Take 1 tablet by mouth daily.    [provider]  Multiple Vitamins-Minerals (VITAMIN D3 COMPLETE PO) Take 1 tablet by mouth daily.    [provider]  Omega-3 Fatty Acids (FISH OIL) 1200 MG CAPS Take by mouth.    [provider]  ondansetron  (ZOFRAN -ODT) 4 MG disintegrating tablet Take 1 tablet (4 mg total) by mouth every 8 (eight) hours as needed for nausea or vomiting. 03/31/22   Armbruster, Elspeth SQUIBB, MD  pantoprazole  (PROTONIX ) 20 MG tablet Take 1 tablet (20 mg total) by mouth daily. 06/22/24   Mercer Clotilda SAUNDERS, MD  tamsulosin  (FLOMAX ) 0.4 MG CAPS capsule Take 1 capsule (0.4 mg total) by mouth daily. 08/22/24  Mercer Clotilda SAUNDERS, MD  tiZANidine  (ZANAFLEX ) 4 MG tablet Take 1 tablet (4 mg total) by mouth at bedtime as needed for muscle spasms. 02/02/24   Mercer Clotilda SAUNDERS, MD  zinc gluconate 50 MG tablet Take 50 mg by mouth daily.    [provider]    Allergies: Novocain [procaine]    Review of Systems  Respiratory:  Negative for shortness of breath.   Cardiovascular:  Positive for chest pain.  Gastrointestinal:  Negative for abdominal pain.  Neurological:  Positive for headaches. Negative for weakness and numbness.    Updated Vital Signs BP 125/80   Pulse 61   Temp 97.9 F (36.6 C) (Oral)   Resp 15   Ht 6' 1 (1.854 m)   Wt 88 kg   SpO2 98%   BMI 25.60 kg/m    Physical Exam Vitals and nursing note reviewed.  Constitutional:      Appearance: He is well-developed.  HENT:     Head: Normocephalic and atraumatic.  Eyes:     Extraocular Movements: Extraocular movements intact.     Pupils: Pupils are equal, round, and reactive to light.     Comments: Grossly normal visual fields  Cardiovascular:     Rate and Rhythm: Normal rate and regular rhythm.     Heart sounds: Normal heart sounds.  Pulmonary:     Effort: Pulmonary effort is normal.     Breath sounds: Normal breath sounds.  Abdominal:     Palpations: Abdomen is soft.     Tenderness: There is no abdominal tenderness.  Musculoskeletal:     Cervical back: No rigidity.     Right lower leg: No edema.     Left lower leg: No edema.  Skin:    General: Skin is warm and dry.  Neurological:     Mental Status: He is alert.     Comments: CN 3-12 grossly intact. 5/5 strength in all 4 extremities. Grossly normal sensation. Normal finger to nose.      (all labs ordered are listed, but only abnormal results are displayed) Labs Reviewed  BASIC METABOLIC PANEL WITH GFR - Abnormal; Notable for the following components:      Result Value   Glucose, Bld 144 (*)    Creatinine, Ser 1.29 (*)    All other components within normal limits  CBC  D-DIMER, QUANTITATIVE  TROPONIN T, HIGH SENSITIVITY  TROPONIN T, HIGH SENSITIVITY    EKG: EKG Interpretation Date/Time:  Friday October 05 2024 21:41:42 EST Ventricular Rate:  63 PR Interval:  153 QRS Duration:  70 QT Interval:  377 QTC Calculation: 386 R Axis:   58  Text Interpretation: Sinus rhythm Multiple premature complexes, vent & supraven Abnormal R-wave progression, early transition no acute ST/T changes Confirmed by Freddi Hamilton [phone]) on 10/05/2024 10:56:31 PM  Radiology: [facility] Chest 2 View Result Date: 10/05/2024 CLINICAL DATA:  cp EXAM: CHEST - 2 VIEW COMPARISON:  Mar 24, 2022 FINDINGS: No focal airspace consolidation, pleural effusion,  or pneumothorax. No cardiomegaly.No acute fracture or destructive lesion. Multilevel thoracic osteophytosis. IMPRESSION: No acute cardiopulmonary abnormality. Electronically Signed   By: Rogelia Myers M.D.   On: 10/05/2024 18:22     Procedures   Medications Ordered in the ED  sodium chloride  0.9 % bolus 1,000 mL (1,000 mLs Intravenous New Bag/Given 10/05/24 2204)  Medical Decision Making Amount and/or Complexity of Data Reviewed Labs: ordered. Radiology: ordered.   Exam is unremarkable including neuroexam and visual field exam.  CT head is currently pending given his daily headache for 6 weeks.  Otherwise, no signs of an obvious PE (D-dimer negative for low risk PE) or MI.  ECG without ischemia.  Care transferred to Dr. Jerrol.     Final diagnoses:  Nonspecific chest pain  Chronic nonintractable headache, unspecified headache type    ED Discharge Orders     None          Freddi Hamilton, MD 10/05/24 2340

## 2024-10-05 NOTE — ED Triage Notes (Signed)
 Pt via pov from home with chest pain x 1 month; states today is worse. He reports that he has a headache that has been holding on for a while as well. Today he states the chest pain is worse and he had a near syncopal episode earlier. He has some poisitional vertigo as well - is being treated for the same. Endorses nausea, denies emesis; states he has had diarrhea for a few days, but he often has diarrhea. Pt a&o x 4; nad noted.

## 2024-10-10 ENCOUNTER — Encounter (INDEPENDENT_AMBULATORY_CARE_PROVIDER_SITE_OTHER): Payer: Self-pay | Admitting: Otolaryngology

## 2024-10-10 ENCOUNTER — Ambulatory Visit (INDEPENDENT_AMBULATORY_CARE_PROVIDER_SITE_OTHER): Payer: PRIVATE HEALTH INSURANCE | Admitting: Otolaryngology

## 2024-10-10 VITALS — BP 131/80 | HR 75 | Temp 97.6°F | Ht 73.0 in | Wt 190.0 lb

## 2024-10-10 DIAGNOSIS — H903 Sensorineural hearing loss, bilateral: Secondary | ICD-10-CM | POA: Diagnosis not present

## 2024-10-10 DIAGNOSIS — H9312 Tinnitus, left ear: Secondary | ICD-10-CM | POA: Diagnosis not present

## 2024-10-10 DIAGNOSIS — R42 Dizziness and giddiness: Secondary | ICD-10-CM | POA: Diagnosis not present

## 2024-10-10 DIAGNOSIS — H9313 Tinnitus, bilateral: Secondary | ICD-10-CM

## 2024-10-10 NOTE — Progress Notes (Signed)
 Reason for Consult: Vertigo and tinnitus Referring Physician: Dr. Mercer Redell Dustin [name] is an 63 y.o. male.  HPI: History of problems with vertigo that is been about 5 weeks.  He recalls the first episode came after a massage that he received.  He looked up some information and then did a Epley maneuver and it resolved the vertigo.  His vertigo was described as laying in bed and turning his head to 1 side and then started having vertigo.  It was not associated with any increase hearing loss or tinnitus.  He has had 3 episodes since then.  He does feel like he is a bit in a fog for a number of days after the episodes when he resolves it with an Epley maneuver.  He has no headaches.  No vision changes.  He has been noted to have nystagmus with the episodes.  He has chronic hearing loss and had previous audiograms.  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  That is a longstanding issue.  He also discusses a problem with laying back and sometimes he cannot breathe out through his nose.  It feels like it is being closed off.  This is usually occurring early in the morning as he wakes up.  He does have snoring.  This does not ever bother him in the day.  He does not have any other neurologic problems.  No change in voice, VPI, or swallowing issues.  Past Medical History:  Diagnosis Date  . GERD (gastroesophageal reflux disease)    occasionally with no meds  . Hypertension   . 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 CHOLECYSTECTOMY  2003  . HERNIA REPAIR     Umbilical hernia  . UMBILICAL HERNIA REPAIR  2003  . WISDOM TOOTH EXTRACTION      Family History  Problem Relation Age of Onset  . Hypertension Mother   . Stroke Mother   . Cancer Sister        melanoma  . CAD Father        CABG at age 5-60  . Heart disease Sister        heart attack 11/2010 - stents placed at age 73-60  . Colon cancer Neg Hx   . Colon polyps Neg Hx   . Rectal cancer  Neg Hx   . Stomach cancer Neg Hx     Social History:  reports that he has never smoked. He has been exposed to tobacco smoke. He has never used smokeless tobacco. He reports current alcohol use. He reports that he does not use drugs.  Allergies:  Allergies  Allergen Reactions  . Novocain [Procaine] Swelling    Medications: I have reviewed the patient's current medications.  No results found for this or any previous visit (from the past 48 hours).  No results found.  ROS Blood pressure 131/80, pulse 75, temperature 97.6 F (36.4 C), height 6' 1 (1.854 m), weight 190 lb (86.2 kg), SpO2 96%. Physical Exam Constitutional:      Appearance: Normal appearance.  HENT:     Head: Normocephalic and atraumatic.     Right Ear: Tympanic membrane is without lesions and middle ear aerated, ear canal and external ear normal.     Left Ear: Tympanic membrane is without lesions and middle ear aerated, ear canal and external ear normal.     Nose: Nose normal. Turbinates with mild hypertrophy, No significant swelling or masses.  Oral cavity/oropharynx: Mucous membranes are moist. No lesions or masses    Larynx: normal voice. Mirror attempted without success    Eyes:     Extraocular Movements: Extraocular movements intact.     Conjunctiva/sclera: Conjunctivae normal.     Pupils: Pupils are equal, round, and reactive to light.  Cardiovascular:     Rate and Rhythm: Normal rate.  Pulmonary:     Effort: Pulmonary effort is normal.  Musculoskeletal:     Cervical back: Normal range of motion and neck supple. No rigidity.  Lymphadenopathy:     Cervical: No cervical adenopathy or masses.salivary glands without lesions. .  Neurological:     Mental Status: He is alert. CN 2-12 intact. No nystagmus      Assessment/Plan: Vertigo-this sounds like benign positional paroxysmal positional vertigo.  He does his own Epley maneuver which resolves it.  It is concerning to me that he gets this after  massage is so I want to make sure that his carotid artery and vertebrals are okay so I am gena get a carotid duplex scan.  Also referral to vestibular rehab to do a formal Epley maneuver will be made.  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  I think this needs workup so with his vertigo as well as the unilateral tinnitus and MRI scan is indicated.  This will be ordered and he will follow-up after that study.  Dustin [name] 10/10/2024, 8:30 AM

## 2024-10-11 ENCOUNTER — Encounter: Payer: Self-pay | Admitting: Neurology

## 2024-11-13 ENCOUNTER — Other Ambulatory Visit (HOSPITAL_BASED_OUTPATIENT_CLINIC_OR_DEPARTMENT_OTHER): Payer: Self-pay

## 2024-11-14 ENCOUNTER — Other Ambulatory Visit (HOSPITAL_BASED_OUTPATIENT_CLINIC_OR_DEPARTMENT_OTHER): Payer: Self-pay

## 2024-11-14 ENCOUNTER — Ambulatory Visit (HOSPITAL_COMMUNITY)
Admission: RE | Admit: 2024-11-14 | Discharge: 2024-11-14 | Disposition: A | Discharge status: Home / Self Care | Payer: PRIVATE HEALTH INSURANCE | Source: Ambulatory Visit | Attending: Otolaryngology | Admitting: Otolaryngology

## 2024-11-14 DIAGNOSIS — R42 Dizziness and giddiness: Secondary | ICD-10-CM | POA: Diagnosis present

## 2024-11-14 NOTE — Progress Notes (Signed)
 Carotid artery doppler has been completed.  Results can be found in chart review under CV Proc.  11/14/2024 2:31 PM  Sandee Bernath Elden Appl, RVT.

## 2024-11-21 ENCOUNTER — Ambulatory Visit (INDEPENDENT_AMBULATORY_CARE_PROVIDER_SITE_OTHER): Payer: PRIVATE HEALTH INSURANCE | Admitting: Physician Assistant

## 2024-11-21 ENCOUNTER — Encounter: Payer: Self-pay | Admitting: Physician Assistant

## 2024-11-21 ENCOUNTER — Other Ambulatory Visit: Payer: PRIVATE HEALTH INSURANCE

## 2024-11-21 VITALS — BP 130/70 | HR 92 | Ht 72.0 in | Wt 197.0 lb

## 2024-11-21 DIAGNOSIS — K529 Noninfective gastroenteritis and colitis, unspecified: Secondary | ICD-10-CM

## 2024-11-21 DIAGNOSIS — Z9049 Acquired absence of other specified parts of digestive tract: Secondary | ICD-10-CM

## 2024-11-21 DIAGNOSIS — Z8619 Personal history of other infectious and parasitic diseases: Secondary | ICD-10-CM

## 2024-11-21 NOTE — Progress Notes (Signed)
 Agree with assessment / plan as outlined.

## 2024-11-21 NOTE — Progress Notes (Signed)
 "     Ellouise Console, PA-C [address] [place], [place]  [address] Phone: [phone]   Primary Care Physician: Mercer Clotilda SAUNDERS, MD  Primary Gastroenterologist:  Ellouise Console, PA-C / Elspeth Naval, MD   Chief Complaint: Diarrhea       HPI:   Discussed the use of AI scribe software for clinical note transcription with the patient, who gave verbal consent to proceed.  Patient last saw Dr. Naval 03/2022 to evaluate diarrhea.  Prior cholecystectomy, umbilical hernia repair, and HTN.  Has had loose stools since cholecystectomy in 2003.  Workup for diarrhea 03/2022 included GI pathogen panel which was positive for Giardia, but negative for all other pathogens.  Giardia was treated with tinidazole  2 g p.o. x 1 dose.  Was given dicyclomine  and Zofran  as needed.  He takes magnesium 100 mg daily.  Pepcid  20 mg daily and pantoprazole  20 mg daily for GERD.  10/05/2024 labs: Normal CBC (WBC 7.4, Hgb 14.9).  BMP significant for elevated creatinine 1.29, glucose 144, otherwise normal.  History of Present Illness Yader Criger is a 64 year old male with prior cholecystectomy who presents for evaluation of chronic worsening diarrhea.  Chronic Diarrhea Post-Cholecystectomy: - Intermittent diarrhea persisting since cholecystectomy several years ago - Episodes occur two to four times per week - Symptom variability: some weeks with three to four consecutive days without diarrhea - Stools often watery, sometimes thin or small in caliber, occasionally well-formed - No blood in stool - Intermittent lower abdominal cramping occurrs before bowel movements and relieved after BM. - No current nausea or vomiting; mild nausea this morning attributed to dizziness (vertigo) - No fever or chills - Notable episode 1.5 to 2 months ago with six to seven watery bowel movements per day for about a week, resolved spontaneously after 1.5 weeks, but loose stools persist - No recent antibiotic use or  travel - No prior use of cholestyramine, Colestid, or fiber supplements for diarrhea  Infectious Gastrointestinal History: - Giardia infection in 2023, treated successfully - Patient suspects possible recurrent exposure from dogs, with episodes of diarrhea following contact - No stool test performed since the most recent episode of diarrhea  Colorectal Cancer Screening: - Colonoscopy in December 2018 was normal with no polyps - Repeat colonoscopy due in December 2028  Hydration and Associated Symptoms: - Drinks approximately 100 ounces of water daily - Positional vertigo with dizziness - Recognizes ongoing diarrhea may contribute to dehydration  Hypertension Management: - Hypertension managed with lisinopril  - Prefers to avoid medications when possible, focusing on exercise and diet  10/2017 colonoscopy: Normal.  No polyps.  1 small diverticulum.  Small internal hemorrhoids.  10-year repeat (10/2027).  Current Outpatient Medications  Medication Sig Dispense Refill   acetaminophen  (TYLENOL ) 500 MG tablet Take 1,000 mg by mouth every 6 (six) hours as needed for headache (pain).     Apple Cider Vinegar-Ginger 500-5 MG CHEW Chew by mouth.     aspirin  EC 81 MG tablet Take 81 mg by mouth daily. Swallow whole.     co-enzyme Q-10 30 MG capsule Take 100 mg by mouth daily.     famotidine  (PEPCID ) 20 MG tablet Take 1 tablet (20 mg total) by mouth daily as needed. 30 tablet 3   lisinopril  (ZESTRIL ) 10 MG tablet Take 1 tablet (10 mg total) by mouth daily. 90 tablet 3   meclizine  (ANTIVERT ) 25 MG tablet Take 2 tablets (50 mg total) by mouth 3 (three) times daily as needed.  60 tablet 0   meloxicam  (MOBIC ) 7.5 MG tablet Take 1 tablet (7.5 mg total) by mouth daily. 30 tablet 0   metoprolol  succinate (TOPROL  XL) 25 MG 24 hr tablet Take 1 tablet (25 mg total) by mouth daily. 90 tablet 1   metoprolol  tartrate (LOPRESSOR ) 25 MG tablet Take 1 tablet (25 mg total) by mouth 2 (two) times daily as needed  (palpitations). 60 tablet 2   Multiple Vitamin (MULTIVITAMIN WITH MINERALS) TABS tablet Take 1 tablet by mouth daily.     Multiple Vitamins-Minerals (VITAMIN D3 COMPLETE PO) Take 1 tablet by mouth daily.     Omega-3 Fatty Acids (FISH OIL) 1200 MG CAPS Take by mouth.     pantoprazole  (PROTONIX ) 20 MG tablet Take 1 tablet (20 mg total) by mouth daily. 90 tablet 1   tamsulosin  (FLOMAX ) 0.4 MG CAPS capsule Take 1 capsule (0.4 mg total) by mouth daily. 90 capsule 0   tiZANidine  (ZANAFLEX ) 4 MG tablet Take 1 tablet (4 mg total) by mouth at bedtime as needed for muscle spasms. 30 tablet 0   zinc gluconate 50 MG tablet Take 50 mg by mouth daily.     Magnesium 100 MG CAPS Take 100 mg by mouth daily. (Patient not taking: Reported on 11/21/2024)     No current facility-administered medications for this visit.    Allergies as of 11/21/2024 - Review Complete 11/21/2024  Allergen Reaction Noted   Novocain [procaine] Swelling 04/19/2019    Past Medical History:  Diagnosis Date   Benign paroxysmal positional vertigo, unspecified laterality    GERD (gastroesophageal reflux disease)    occasionally with no meds   Hypertension    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   Palpitations    Paroxysmal SVT (supraventricular tachycardia)    PVC (premature ventricular contraction)    Vertigo     Past Surgical History:  Procedure Laterality Date   CHOLECYSTECTOMY  2003   HERNIA REPAIR     Umbilical hernia   UMBILICAL HERNIA REPAIR  2003   WISDOM TOOTH EXTRACTION      Review of Systems:    All systems reviewed and negative except where noted in HPI.    Physical Exam:  BP 130/70 (BP Location: Left Arm, Patient Position: Sitting, Cuff Size: Normal)   Pulse 92 Comment: irregular  Ht 6' (1.829 m) Comment: height measured without shoes  Wt 197 lb (89.4 kg)   BMI 26.72 kg/m  No LMP for male patient.  General: Well-nourished, well-developed in no acute distress.  Lungs: Clear to auscultation bilaterally.  Non-labored. Heart: Regular rate and rhythm, no murmurs rubs or gallops.  Abdomen: Bowel sounds are normal; Abdomen is Soft; No hepatosplenomegaly, masses or hernias;  No Abdominal Tenderness; No guarding or rebound tenderness. Neuro: Alert and oriented x 3.  Grossly intact.  Psych: Alert and cooperative, normal mood and affect.   Imaging Studies: VAS US  CAROTID Result Date: 11/14/2024 Carotid Arterial Duplex Study Patient Name:  NIKOLOZ HUY  Date of Exam:   11/14/2024 Medical Rec #: [id number]      Accession #:    [id number] Date of Birth: [birth year]      Patient Gender: M Patient Age:   17 years Exam Location:  [facility] Procedure:      VAS US  CAROTID Referring Phys: NORLEEN NOTICE --------------------------------------------------------------------------------  Indications:       Dizziness and giddiness. Risk Factors:      Hypertension. Comparison Study:  No prior exam. Performing Technologist: Edilia Elden Appl  Examination Guidelines:  A complete evaluation includes B-mode imaging, spectral Doppler, color Doppler, and power Doppler as needed of all accessible portions of each vessel. Bilateral testing is considered an integral part of a complete examination. Limited examinations for reoccurring indications may be performed as noted.  Right Carotid Findings: +----------+--------+--------+--------+------------------+--------+           PSV cm/sEDV cm/sStenosisPlaque DescriptionComments +----------+--------+--------+--------+------------------+--------+ CCA Prox  139     33                                         +----------+--------+--------+--------+------------------+--------+ CCA Distal86      22                                         +----------+--------+--------+--------+------------------+--------+ ICA Prox  68      27                                         +----------+--------+--------+--------+------------------+--------+ ICA Mid   87      35                                          +----------+--------+--------+--------+------------------+--------+ ICA Distal85      36                                         +----------+--------+--------+--------+------------------+--------+ ECA       91      17                                         +----------+--------+--------+--------+------------------+--------+ +----------+--------+-------+----------------+-------------------+           PSV cm/sEDV cmsDescribe        Arm Pressure (mmHG) +----------+--------+-------+----------------+-------------------+ Subclavian122     20     Multiphasic, WNL                    +----------+--------+-------+----------------+-------------------+ +---------+--------+--+--------+--+---------+ VertebralPSV cm/s64EDV cm/s22Antegrade +---------+--------+--+--------+--+---------+  Left Carotid Findings: +----------+--------+--------+--------+------------------+--------+           PSV cm/sEDV cm/sStenosisPlaque DescriptionComments +----------+--------+--------+--------+------------------+--------+ CCA Prox  131     32                                         +----------+--------+--------+--------+------------------+--------+ CCA Distal81      28                                         +----------+--------+--------+--------+------------------+--------+ ICA Prox  66      20                                         +----------+--------+--------+--------+------------------+--------+  ICA Mid   89      36                                         +----------+--------+--------+--------+------------------+--------+ ICA Distal114     46                                         +----------+--------+--------+--------+------------------+--------+ ECA       99      23                                         +----------+--------+--------+--------+------------------+--------+  +----------+--------+--------+----------------+-------------------+           PSV cm/sEDV cm/sDescribe        Arm Pressure (mmHG) +----------+--------+--------+----------------+-------------------+ [id number]     14      Multiphasic, WNL                    +----------+--------+--------+----------------+-------------------+ +---------+--------+--+--------+--+---------+ VertebralPSV cm/s51EDV cm/s16Antegrade +---------+--------+--+--------+--+---------+ Left thyroid  nodule noted measuring 1.0 x 0.6 cm.  Summary: Right Carotid: Velocities in the right ICA are consistent with a 1-39% stenosis. Left Carotid: Velocities in the left ICA are consistent with a 1-39% stenosis. Vertebrals:  Bilateral vertebral arteries demonstrate antegrade flow. Subclavians: Normal flow hemodynamics were seen in bilateral subclavian              arteries. *See table(s) above for measurements and observations.  Electronically signed by Gaile New MD on 11/14/2024 at 7:08:13 PM.    Final     Labs: CBC    Component Value Date/Time   WBC 7.4 10/05/2024 1750   RBC 4.66 10/05/2024 1750   HGB 14.9 10/05/2024 1750   HCT 42.4 10/05/2024 1750   PLT 273 10/05/2024 1750   MCV 91.0 10/05/2024 1750   MCH 32.0 10/05/2024 1750   MCHC 35.1 10/05/2024 1750   RDW 12.4 10/05/2024 1750   LYMPHSABS 1.5 06/07/2024 1128   MONOABS 0.4 06/07/2024 1128   EOSABS 0.0 06/07/2024 1128   BASOSABS 0.0 06/07/2024 1128    CMP     Component Value Date/Time   NA 141 10/05/2024 1750   NA 138 04/28/2021 1622   K 4.4 10/05/2024 1750   CL 102 10/05/2024 1750   CO2 26 10/05/2024 1750   GLUCOSE 144 (H) 10/05/2024 1750   BUN 17 10/05/2024 1750   BUN 17 04/28/2021 1622   CREATININE 1.29 (H) 10/05/2024 1750   CALCIUM  10.0 10/05/2024 1750   PROT 7.6 06/07/2024 1128   ALBUMIN 4.6 06/07/2024 1128   AST 18 06/07/2024 1128   ALT 17 06/07/2024 1128   ALKPHOS 48 06/07/2024 1128   BILITOT 0.9 06/07/2024 1128   GFRNONAA >60  10/05/2024 1750   GFRAA >60 04/20/2019 0425       Assessment and Plan:   JAXSEN BERNHART is a 64 y.o. y/o male presents for evaluation of:  1.  Chronic worsening diarrhea Prior cholecystectomy 2003.  Remote history of Giardia treated in 2023. - Ordered: GI pathogen panel PCR stool test. - If GI pathogen panel is negative for infection, then Rx Colestid 2 tablets once or twice daily to treat bouts of diarrhea. - If Colestid is ineffective and  diarrhea persists, then consider repeat colonoscopy to check for microscopic colitis. - Also consider trial of FiberCon 2 tablets twice daily to help bulk stool. - If Giardia is present, then recommend test his well water.  2.  Colon cancer screening Negative colonoscopy 10/2017.  10-year repeat screening due 10/2027.    Ellouise Console, PA-C  Follow up in 6 weeks with TG.  Also follow-up based on stool test results and symptoms.   "

## 2024-11-21 NOTE — Patient Instructions (Signed)
 Your provider has requested that you go to the basement level for lab work before leaving today. Press B on the elevator. The lab is located at the first door on the left as you exit the elevator.  Please follow up sooner if symptoms increase or worsen  Due to recent changes in healthcare laws, you may see the results of your imaging and laboratory studies on MyChart before your provider has had a chance to review them.  We understand that in some cases there may be results that are confusing or concerning to you. Not all laboratory results come back in the same time frame and the provider may be waiting for multiple results in order to interpret others.  Please give us  48 hours in order for your provider to thoroughly review all the results before contacting the office for clarification of your results.   Thank you for trusting me with your gastrointestinal care!   Ellouise Console, PA-C _______________________________________________________  If your blood pressure at your visit was 140/90 or greater, please contact your primary care physician to follow up on this.  _______________________________________________________  If you are age 54 or older, your body mass index should be between 23-30. Your Body mass index is 26.72 kg/m. If this is out of the aforementioned range listed, please consider follow up with your Primary Care Provider.  If you are age 71 or younger, your body mass index should be between 19-25. Your Body mass index is 26.72 kg/m. If this is out of the aformentioned range listed, please consider follow up with your Primary Care Provider.   ________________________________________________________  The [facility] providers would like to encourage you to use MYCHART to communicate with providers for non-urgent requests or questions.  Due to long hold times on the telephone, sending your provider a message by [facility] may be a faster and more efficient way to get a response.   Please allow 48 business hours for a response.  Please remember that this is for non-urgent requests.  _______________________________________________________

## 2024-11-27 ENCOUNTER — Encounter (INDEPENDENT_AMBULATORY_CARE_PROVIDER_SITE_OTHER): Payer: Self-pay

## 2024-11-27 ENCOUNTER — Telehealth (INDEPENDENT_AMBULATORY_CARE_PROVIDER_SITE_OTHER): Payer: Self-pay | Admitting: Otolaryngology

## 2024-11-27 NOTE — Telephone Encounter (Signed)
 Called patient and let them know to go ahead and bring in the CD and I will personally make sure that Dr. Roark gets the CD and see's the images. Patient understood.

## 2024-11-27 NOTE — Telephone Encounter (Signed)
 Spoke with patient to schedule hearing evaluation and follow up appointment.  Patient wanted to know if you have received the results from the imaging he had done.  He was given a CD and is going to bring that in on 11/29/2024 when he comes to his appointment for a Hearing Evaluation.  If you do not need the CD or have any questions, please call the patient at [phone].

## 2024-11-29 ENCOUNTER — Ambulatory Visit (INDEPENDENT_AMBULATORY_CARE_PROVIDER_SITE_OTHER): Payer: PRIVATE HEALTH INSURANCE | Admitting: Audiology

## 2024-11-29 ENCOUNTER — Telehealth (INDEPENDENT_AMBULATORY_CARE_PROVIDER_SITE_OTHER): Payer: Self-pay | Admitting: Otolaryngology

## 2024-11-29 DIAGNOSIS — H903 Sensorineural hearing loss, bilateral: Secondary | ICD-10-CM

## 2024-11-29 NOTE — Progress Notes (Signed)
" °  [address] [place], [place] [address] [phone]  Audiological Evaluation    Name: Dustin [name]     DOB:   [birth year]      MRN:   [id number]                                                                                     Service Date: 11/29/2024     Accompanied by: self    Patient comes today after Dr. Roark, ENT sent a referral for a hearing evaluation due to concerns with hearing loss.   Symptoms Yes Details  Hearing loss  [x]  Reports to have been told to have high frequency hearing loss in both ears.  Tinnitus  [x]  Reports sometimes right ear ringing, but maily is left sided  Ear pain/ infections/pressure  []    Balance problems  []    Noise exposure history  [x]  Shooting, loud music, headphones  Previous ear surgeries  []    Family history of hearing loss  []    Amplification  []    Other  []      Otoscopy: Right ear: Clear external ear canal and notable landmarks visualized on the tympanic membrane. Left ear:  Clear external ear canal and notable landmarks visualized on the tympanic membrane.  Tympanometry: Right ear: Type A - Normal external ear canal volume with normal middle ear pressure and normal tympanic membrane compliance. Findings are consistent with normal middle ear function. Left ear: Type A - Normal external ear canal volume with normal middle ear pressure and normal tympanic membrane compliance. Findings are consistent with normal middle ear function.   Hearing Evaluation The hearing test results were completed under inserts and results are deemed to be of good reliability. Test technique:  conventional    Pure tone Audiometry: Right ear- Normal hearing from [phone] Hz, then moderate to severe sensorineural hearing loss from 2000 Hz - 8000 Hz. Left ear-   Normal hearing from [phone] Hz, then moderate to severe sensorineural hearing loss from 2000 Hz - 8000 Hz.  Speech Audiometry: Right ear- Speech Reception Threshold (SRT) was  obtained at 20 dBHL. Left ear-Speech Reception Threshold (SRT) was obtained at 20 dBHL.   Word Recognition Score Tested using NU-6 (recorded) Right ear: 96% was obtained at a presentation level of 85 dBHL with contralateral masking which is deemed as  excellent. Left ear: 92% was obtained at a presentation level of 85 dBHL with contralateral masking which is deemed as  excellent.   Impression: There is not a significant difference in pure-tone thresholds between ears. There is not a significant difference in the word recognition score in between ears.    Recommendations: Follow up with ENT as scheduled. Return for a hearing evaluation if concerns with hearing changes arise or per MD recommendation. Consider various tinnitus strategies, including the use of a sound generator, hearing aids, and/or tinnitus retraining therapy.  Consider a communication needs assessment for amplification after medical clearance is obtained, if needed.   Marcos Peloso MARIE LEROUX-MARTINEZ, AUD  "

## 2024-11-29 NOTE — Telephone Encounter (Signed)
 Patient dropped of MRI disk to front desk and front dest brought it back to me. I am giving disk to Powell Barge in surgery scheduling to hold on to for Dr.Byers.

## 2024-11-29 NOTE — Telephone Encounter (Signed)
 Pt drop off disk for cynthia

## 2024-12-04 ENCOUNTER — Other Ambulatory Visit (HOSPITAL_BASED_OUTPATIENT_CLINIC_OR_DEPARTMENT_OTHER): Payer: Self-pay

## 2024-12-04 ENCOUNTER — Other Ambulatory Visit: Payer: Self-pay | Admitting: Family Medicine

## 2024-12-04 DIAGNOSIS — N401 Enlarged prostate with lower urinary tract symptoms: Secondary | ICD-10-CM

## 2024-12-04 DIAGNOSIS — R300 Dysuria: Secondary | ICD-10-CM

## 2024-12-04 DIAGNOSIS — R350 Frequency of micturition: Secondary | ICD-10-CM

## 2024-12-04 MED ORDER — TAMSULOSIN HCL 0.4 MG PO CAPS
0.4000 mg | ORAL_CAPSULE | Freq: Every day | ORAL | 0 refills | Status: AC
  Filled 2024-12-04: qty 90, 90d supply, fill #0

## 2024-12-06 ENCOUNTER — Other Ambulatory Visit (HOSPITAL_BASED_OUTPATIENT_CLINIC_OR_DEPARTMENT_OTHER): Payer: Self-pay

## 2024-12-08 ENCOUNTER — Other Ambulatory Visit (HOSPITAL_BASED_OUTPATIENT_CLINIC_OR_DEPARTMENT_OTHER): Payer: Self-pay

## 2024-12-26 ENCOUNTER — Ambulatory Visit (INDEPENDENT_AMBULATORY_CARE_PROVIDER_SITE_OTHER): Payer: PRIVATE HEALTH INSURANCE | Admitting: Otolaryngology

## 2025-01-02 ENCOUNTER — Ambulatory Visit: Payer: PRIVATE HEALTH INSURANCE | Admitting: Physician Assistant

## 2025-01-22 ENCOUNTER — Ambulatory Visit: Payer: PRIVATE HEALTH INSURANCE | Admitting: Neurology
